# Patient Record
Sex: Female | Born: 1950 | Race: Black or African American | Hispanic: No | Marital: Single | Smoking: Former smoker
Health system: Southern US, Community
[De-identification: ages and names within clinical notes are randomized; demographics above are authoritative.]

## PROBLEM LIST (undated history)

## (undated) DIAGNOSIS — L03116 Cellulitis of left lower limb: Secondary | ICD-10-CM

## (undated) DIAGNOSIS — I10 Essential (primary) hypertension: Secondary | ICD-10-CM

## (undated) DIAGNOSIS — C541 Malignant neoplasm of endometrium: Secondary | ICD-10-CM

## (undated) DIAGNOSIS — E119 Type 2 diabetes mellitus without complications: Secondary | ICD-10-CM

## (undated) DIAGNOSIS — R627 Adult failure to thrive: Secondary | ICD-10-CM

## (undated) DIAGNOSIS — R339 Retention of urine, unspecified: Secondary | ICD-10-CM

## (undated) DIAGNOSIS — E46 Unspecified protein-calorie malnutrition: Secondary | ICD-10-CM

## (undated) DIAGNOSIS — J45909 Unspecified asthma, uncomplicated: Secondary | ICD-10-CM

## (undated) DIAGNOSIS — L98429 Non-pressure chronic ulcer of back with unspecified severity: Secondary | ICD-10-CM

## (undated) DIAGNOSIS — E079 Disorder of thyroid, unspecified: Secondary | ICD-10-CM

## (undated) DIAGNOSIS — E785 Hyperlipidemia, unspecified: Secondary | ICD-10-CM

## (undated) DIAGNOSIS — H409 Unspecified glaucoma: Secondary | ICD-10-CM

## (undated) DIAGNOSIS — I700 Atherosclerosis of aorta: Secondary | ICD-10-CM

## (undated) DIAGNOSIS — L03115 Cellulitis of right lower limb: Secondary | ICD-10-CM

## (undated) HISTORY — DX: Unspecified glaucoma: H40.9

## (undated) HISTORY — DX: Unspecified protein-calorie malnutrition: E46

## (undated) HISTORY — DX: Cellulitis of left lower limb: L03.116

## (undated) HISTORY — DX: Type 2 diabetes mellitus without complications: E11.9

## (undated) HISTORY — DX: Cellulitis of right lower limb: L03.115

## (undated) HISTORY — DX: Non-pressure chronic ulcer of back with unspecified severity: L98.429

## (undated) HISTORY — PX: APPENDECTOMY: SHX54

## (undated) HISTORY — DX: Retention of urine, unspecified: R33.9

## (undated) HISTORY — DX: Disorder of thyroid, unspecified: E07.9

## (undated) HISTORY — DX: Adult failure to thrive: R62.7

## (undated) HISTORY — DX: Unspecified asthma, uncomplicated: J45.909

## (undated) HISTORY — DX: Atherosclerosis of aorta: I70.0

## (undated) HISTORY — DX: Malignant neoplasm of endometrium: C54.1

---

## 2005-02-13 ENCOUNTER — Ambulatory Visit: Payer: Self-pay

## 2007-11-16 ENCOUNTER — Other Ambulatory Visit: Payer: Self-pay

## 2007-11-16 ENCOUNTER — Ambulatory Visit: Payer: Self-pay | Admitting: Ophthalmology

## 2007-12-01 ENCOUNTER — Ambulatory Visit: Payer: Self-pay | Admitting: Ophthalmology

## 2008-02-09 ENCOUNTER — Ambulatory Visit: Payer: Self-pay | Admitting: Ophthalmology

## 2008-11-15 ENCOUNTER — Ambulatory Visit: Payer: Self-pay

## 2009-11-24 ENCOUNTER — Ambulatory Visit: Payer: Self-pay | Admitting: Family Medicine

## 2011-05-27 ENCOUNTER — Ambulatory Visit: Payer: Self-pay | Admitting: Family Medicine

## 2013-03-06 ENCOUNTER — Emergency Department: Payer: Self-pay | Admitting: Unknown Physician Specialty

## 2013-03-06 LAB — COMPREHENSIVE METABOLIC PANEL
Albumin: 3.2 g/dL — ABNORMAL LOW (ref 3.4–5.0)
Alkaline Phosphatase: 147 U/L — ABNORMAL HIGH (ref 50–136)
Anion Gap: 4 — ABNORMAL LOW (ref 7–16)
BUN: 17 mg/dL (ref 7–18)
Bilirubin,Total: 0.3 mg/dL (ref 0.2–1.0)
Calcium, Total: 9.1 mg/dL (ref 8.5–10.1)
Chloride: 106 mmol/L (ref 98–107)
Co2: 26 mmol/L (ref 21–32)
Creatinine: 1 mg/dL (ref 0.60–1.30)
EGFR (African American): >60
Potassium: 4.3 mmol/L (ref 3.5–5.1)
SGOT(AST): 15 U/L (ref 15–37)
Sodium: 136 mmol/L (ref 136–145)

## 2013-03-06 LAB — CBC WITH DIFFERENTIAL/PLATELET
Basophil %: 0.7 %
Eosinophil #: 0.2 10*3/uL (ref 0.0–0.7)
Eosinophil %: 2.3 %
HCT: 39.1 % (ref 35.0–47.0)
HGB: 12.7 g/dL (ref 12.0–16.0)
Lymphocyte #: 1.6 10*3/uL (ref 1.0–3.6)
MCH: 28 pg (ref 26.0–34.0)
MCHC: 32.4 g/dL (ref 32.0–36.0)
MCV: 86 fL (ref 80–100)
Monocyte #: 0.6 x10 3/mm (ref 0.2–0.9)
Neutrophil #: 4.5 10*3/uL (ref 1.4–6.5)
Platelet: 267 10*3/uL (ref 150–440)
WBC: 6.9 10*3/uL (ref 3.6–11.0)

## 2015-09-06 ENCOUNTER — Ambulatory Visit: Payer: Self-pay | Attending: Oncology | Admitting: *Deleted

## 2015-09-06 ENCOUNTER — Ambulatory Visit
Admission: RE | Admit: 2015-09-06 | Discharge: 2015-09-06 | Disposition: A | Discharge status: Home / Self Care | Payer: Self-pay | Source: Ambulatory Visit | Attending: Oncology | Admitting: Oncology

## 2015-09-06 ENCOUNTER — Encounter: Payer: Self-pay | Admitting: *Deleted

## 2015-09-06 VITALS — BP 117/79 | HR 92 | Temp 97.3°F | Ht 63.0 in | Wt 204.8 lb

## 2015-09-06 DIAGNOSIS — Z0000 Encounter for general adult medical examination without abnormal findings: Secondary | ICD-10-CM

## 2015-09-06 NOTE — Progress Notes (Addendum)
Subjective:     Patient ID: [name], female   DOB: [birth year], 64 y.o.   MRN: [id number]  HPI   Review of Systems     Objective:   Physical Exam  Pulmonary/Chest: Right breast exhibits no inverted nipple, no mass, no nipple discharge, no skin change and no tenderness. Left breast exhibits no inverted nipple, no mass, no nipple discharge, no skin change and no tenderness. Breasts are symmetrical.  Large pendulous breast       Assessment:      64 year old Black female returns to [facility] for clinical breast exam and mammogram.  Patient's last pap was in 2009, but she refuses to have a pap smear ever again. Patient was unable to get on the exam table.  Breast exam was completed with the patient sitting in a chair. Clinical breast exam unremarkable.  Taught self breast awareness.  Patient has been screened for eligibility.  She does not have any insurance, Medicare or Medicaid.  She also meets financial eligibility.  Hand-out given on the Affordable Care Act.    Plan:     Screening mammogram ordered.  Will follow-up per protocol.

## 2015-09-06 NOTE — Patient Instructions (Signed)
Gave patient hand-out, Women Staying Healthy, Active and Well from [facility], with education on breast health, pap smears, heart and colon health. 

## 2015-09-11 ENCOUNTER — Encounter: Payer: Self-pay | Admitting: *Deleted

## 2015-09-11 NOTE — Progress Notes (Signed)
Letter mailed from the [facility] to inform patient of her normal mammogram results.  Patient is to follow-up with annual screening in one year.  HSIS to Christy. 

## 2015-12-20 ENCOUNTER — Other Ambulatory Visit: Payer: Self-pay | Admitting: Family Medicine

## 2015-12-20 ENCOUNTER — Ambulatory Visit
Admission: RE | Admit: 2015-12-20 | Discharge: 2015-12-20 | Disposition: A | Discharge status: Home / Self Care | Payer: Self-pay | Source: Ambulatory Visit | Attending: Family Medicine | Admitting: Family Medicine

## 2015-12-20 DIAGNOSIS — M25552 Pain in left hip: Secondary | ICD-10-CM

## 2015-12-20 DIAGNOSIS — M25562 Pain in left knee: Secondary | ICD-10-CM

## 2015-12-20 DIAGNOSIS — M1712 Unilateral primary osteoarthritis, left knee: Secondary | ICD-10-CM | POA: Insufficient documentation

## 2015-12-20 DIAGNOSIS — M1612 Unilateral primary osteoarthritis, left hip: Secondary | ICD-10-CM | POA: Insufficient documentation

## 2016-09-27 ENCOUNTER — Other Ambulatory Visit: Payer: Self-pay | Admitting: Family Medicine

## 2016-09-27 DIAGNOSIS — R609 Edema, unspecified: Secondary | ICD-10-CM

## 2016-09-30 ENCOUNTER — Ambulatory Visit
Admission: RE | Admit: 2016-09-30 | Discharge: 2016-09-30 | Disposition: A | Discharge status: Home / Self Care | Payer: Medicare Other | Source: Ambulatory Visit | Attending: Family Medicine | Admitting: Family Medicine

## 2016-09-30 DIAGNOSIS — R609 Edema, unspecified: Secondary | ICD-10-CM | POA: Diagnosis not present

## 2016-11-04 ENCOUNTER — Encounter (INDEPENDENT_AMBULATORY_CARE_PROVIDER_SITE_OTHER): Payer: Medicare Other | Admitting: Vascular Surgery

## 2016-11-18 ENCOUNTER — Ambulatory Visit (INDEPENDENT_AMBULATORY_CARE_PROVIDER_SITE_OTHER): Payer: Medicare Other | Admitting: Vascular Surgery

## 2016-11-18 ENCOUNTER — Encounter (INDEPENDENT_AMBULATORY_CARE_PROVIDER_SITE_OTHER): Payer: Self-pay | Admitting: Vascular Surgery

## 2016-11-18 DIAGNOSIS — Z794 Long term (current) use of insulin: Secondary | ICD-10-CM

## 2016-11-18 DIAGNOSIS — I890 Lymphedema, not elsewhere classified: Secondary | ICD-10-CM | POA: Diagnosis not present

## 2016-11-18 DIAGNOSIS — E119 Type 2 diabetes mellitus without complications: Secondary | ICD-10-CM | POA: Diagnosis not present

## 2016-11-18 DIAGNOSIS — M7989 Other specified soft tissue disorders: Secondary | ICD-10-CM | POA: Insufficient documentation

## 2016-11-18 DIAGNOSIS — I83029 Varicose veins of left lower extremity with ulcer of unspecified site: Secondary | ICD-10-CM | POA: Insufficient documentation

## 2016-11-18 DIAGNOSIS — I872 Venous insufficiency (chronic) (peripheral): Secondary | ICD-10-CM | POA: Insufficient documentation

## 2016-11-18 DIAGNOSIS — L97929 Non-pressure chronic ulcer of unspecified part of left lower leg with unspecified severity: Principal | ICD-10-CM

## 2016-11-18 DIAGNOSIS — E782 Mixed hyperlipidemia: Secondary | ICD-10-CM

## 2016-11-18 DIAGNOSIS — E785 Hyperlipidemia, unspecified: Secondary | ICD-10-CM | POA: Insufficient documentation

## 2016-11-18 NOTE — Progress Notes (Signed)
MRN : [id number]  [name] is a 65 y.o. (01-06-1951) female who presents with chief complaint of  Chief Complaint  Patient presents with  . New Evaluation    Left leg and foot swelling  .  History of Present Illness: Patient is seen for evaluation of leg pain and swelling associated with new onset ulceration left leg. The patient first noticed the swelling remotely. The swelling is associated with pain and discoloration. The pain and swelling worsens with prolonged dependency and improves with elevation. The pain is unrelated to activity.  The patient notes that in the morning the legs are better but the leg symptoms worsened throughout the course of the day. The patient has also noted a progressive worsening of the discoloration in the ankle and shin area.   The patient notes that a left ankleulcer has developed acutely without specific trauma and since it occurred it has been very slow to heal.  There is a moderate amount of drainage associated with the open area.  The wound is also very painful.  The patient denies claudication symptoms or rest pain symptoms.  The patient denies DJD and LS spine disease.  The patient has not had any past angiography, interventions or vascular surgery.  Elevation makes the leg symptoms better, dependency makes them much worse. The patient denies any recent changes in medications.  The patient has not been wearing graduated compression.  The patient denies a history of DVT or PE. There is no prior history of phlebitis. There is no history of primary lymphedema.  No history of malignancies. No history of trauma or groin or pelvic surgery. There is no history of radiation treatment to the groin or pelvis       Current Meds  Medication Sig  . ammonium lactate (AMLACTIN) 12 % cream Apply topically as needed for dry skin.  Marland Kitchen aspirin 81 MG chewable tablet Chew by mouth daily.  . cetirizine (ZYRTEC) 10 MG tablet Take 10 mg by mouth daily.  Marland Kitchen  glipiZIDE (GLUCOTROL) 5 MG tablet Take by mouth daily before breakfast.  . hydrochlorothiazide (HYDRODIURIL) 25 MG tablet Take 25 mg by mouth daily.  Marland Kitchen levothyroxine (SYNTHROID, LEVOTHROID) 125 MCG tablet Take 125 mcg by mouth daily before breakfast.  . linagliptin (TRADJENTA) 5 MG TABS tablet Take 5 mg by mouth daily.  . Magnesium 200 MG TABS Take by mouth.  . metFORMIN (GLUCOPHAGE) 500 MG tablet Take by mouth 2 (two) times daily with a meal.  . pravastatin (PRAVACHOL) 20 MG tablet Take 20 mg by mouth daily.  . quinapril (ACCUPRIL) 40 MG tablet Take 40 mg by mouth at bedtime.  . triamcinolone ointment (KENALOG) 0.1 % Apply 1 application topically 2 (two) times daily.    Past Medical History:  Diagnosis Date  . Diabetes mellitus without complication [facility])     Past Surgical History:  Procedure Laterality Date  . APPENDECTOMY    . TONSILLECTOMY      Social History Social History  Substance Use Topics  . Smoking status: Former Research scientist (life sciences)  . Smokeless tobacco: Never Used  . Alcohol use No    Family History Family History  Problem Relation Age of Onset  . Diabetes Mother   . Diabetes Father   . Breast cancer Neg Hx   No family history of bleeding/clotting disorders, porphyria or autoimmune disease   Allergies  Allergen Reactions  . Penicillins Swelling     REVIEW OF SYSTEMS (Negative unless checked)  Constitutional: [] Weight loss  []   Fever  [] Chills Cardiac: [] Chest pain   [] Chest pressure   [] Palpitations   [] Shortness of breath when laying flat   [] Shortness of breath with exertion. Vascular:  [] Pain in legs with walking   [] Pain in legs at rest  [] History of DVT   [] Phlebitis   [x] Swelling in legs   [x] Varicose veins   [x] Non-healing ulcers Pulmonary:   [] Uses home oxygen   [] Productive cough   [] Hemoptysis   [] Wheeze  [] COPD   [] Asthma Neurologic:  [] Dizziness   [] Seizures   [] History of stroke   [] History of TIA  [] Aphasia   [] Vissual changes   [] Weakness or numbness in  arm   [] Weakness or numbness in leg Musculoskeletal:   [] Joint swelling   [x] Joint pain   [] Low back pain Hematologic:  [] Easy bruising  [] Easy bleeding   [] Hypercoagulable state   [] Anemic Gastrointestinal:  [] Diarrhea   [] Vomiting  [] Gastroesophageal reflux/heartburn   [] Difficulty swallowing. Genitourinary:  [] Chronic kidney disease   [] Difficult urination  [] Frequent urination   [] Blood in urine Skin:  [] Rashes   [] Ulcers  Psychological:  [] History of anxiety   []  History of major depression.  Physical Examination  Vitals:   11/18/16 1142  BP: (!) 144/84  Pulse: 95  Resp: 17  Weight: 198 lb (89.8 kg)  Height: 5\' 3"  (1.6 m)   Body mass index is 35.07 kg/m. Gen: WD/WN, NAD Head: [place]/AT, No temporalis wasting.  Ear/Nose/Throat: Hearing grossly intact, nares w/o erythema or drainage, poor dentition Eyes: PER, EOMI, sclera nonicteric.  Neck: Supple, no masses.  No bruit or JVD.  Pulmonary:  Good air movement, clear to auscultation bilaterally, no use of accessory muscles.  Cardiac: RRR, normal S1, S2, no Murmurs. Vascular: bilateral lower extremities demonstrate 2-3+ edema there are mild venous stasis changes on the right there are severe venous stasis changes of the left associated with ulceration of the ankle area. Subcutaneous tissues are exposed. No surrounding erythema or induration minimal drainage.diffuse varicosities are noted bilaterally Vessel Right Left  Radial Palpable Palpable  Ulnar Palpable Palpable  Brachial Palpable Palpable  Carotid Palpable Palpable  Femoral Palpable Palpable  Popliteal Palpable Palpable  PT Palpable Not Palpable  DP Palpable Palpable   Gastrointestinal: soft, non-distended. No guarding/no peritoneal signs.  Musculoskeletal: M/S 5/5 throughout.  No deformity or atrophy.  Neurologic: CN 2-12 intact. Pain and light touch intact in extremities.  Symmetrical.  Speech is fluent. Motor exam as listed above. Psychiatric: Judgment intact, Mood &  affect appropriate for pt's clinical situation. Dermatologic: No rashes or ulcers noted.  No changes consistent with cellulitis. Lymph : No Cervical lymphadenopathy, no lichenification or skin changes of chronic lymphedema.  CBC Lab Results  Component Value Date   WBC 6.9 03/06/2013   HGB 12.7 03/06/2013   HCT 39.1 03/06/2013   MCV 86 03/06/2013   PLT 267 03/06/2013    BMET    Component Value Date/Time   NA 136 03/06/2013 1122   K 4.3 03/06/2013 1122   CL 106 03/06/2013 1122   CO2 26 03/06/2013 1122   GLUCOSE 243 (H) 03/06/2013 1122   BUN 17 03/06/2013 1122   CREATININE 1.00 03/06/2013 1122   CALCIUM 9.1 03/06/2013 1122   GFRNONAA >60 03/06/2013 1122   GFRAA >60 03/06/2013 1122   CrCl cannot be calculated (Patient's most recent lab result is older than the maximum 21 days allowed.).  COAG No results found for: INR, PROTIME  Radiology No results found.  Outside Studies/Documentation 8 pages of outside documents  were reviewed.  They showed similar findings on physical examination duplex ultrasound report states no evidence for DVT no significant reflux identified in the report.  Assessment/Plan 1. Venous ulcer of left leg (HCC) No surgery or intervention at this point in time.    I have had a long discussion with the patient regarding venous insufficiency and why it  causes symptoms, specifically venous ulceration . I have discussed with the patient the chronic skin changes that accompany venous insufficiency and the long term sequela such as infection and recurring  ulceration.  Patient will be placed in [employer] which will be changed weekly drainage permitting.  In addition, behavioral modification including several periods of elevation of the lower extremities during the day will be continued. Achieving a position with the ankles at heart level was stressed to the patient  The patient is instructed to begin routine exercise, especially walking on a daily  basis  Patient's duplex ultrasound of the venous system was negative for DVT or reflux is not present.  Following the review of the ultrasound the patient will follow up in one week to reassess the degree of swelling and the control that Unna therapy is offering.   The patient can be assessed for graduated compression stockings or wraps as well as a Lymph Pump once the ulcers are healed.   2. Chronic venous insufficiency Seen #1  3. Lymphedema See #1 Once ulceration is healed consideration for lymph pump will be made  4. Swelling of limb See #1  5. Type 2 diabetes mellitus with insulin therapy ([place]) Continue hypoglycemic  medications as already ordered and reviewed, no changes at this time.  6. Mixed hyperlipidemia Continue statin as ordered and reviewed, no changes at this time      Hortencia Pilar, MD  11/18/2016 12:58 PM

## 2016-11-25 ENCOUNTER — Encounter (INDEPENDENT_AMBULATORY_CARE_PROVIDER_SITE_OTHER): Payer: Self-pay

## 2016-11-25 ENCOUNTER — Ambulatory Visit (INDEPENDENT_AMBULATORY_CARE_PROVIDER_SITE_OTHER): Payer: Medicare Other | Admitting: Vascular Surgery

## 2016-11-25 VITALS — BP 151/86 | HR 96 | Resp 16 | Ht 63.0 in | Wt 198.0 lb

## 2016-11-25 DIAGNOSIS — I83029 Varicose veins of left lower extremity with ulcer of unspecified site: Secondary | ICD-10-CM

## 2016-11-25 DIAGNOSIS — L97929 Non-pressure chronic ulcer of unspecified part of left lower leg with unspecified severity: Principal | ICD-10-CM

## 2016-11-25 NOTE — Progress Notes (Signed)
History of Present Illness  There is no documented history at this time  Assessments & Plan   There are no diagnoses linked to this encounter.    Additional instructions  Subjective:  Patient presents with venous ulcer of the Left lower extremity.    Procedure:  3 layer unna wrap was placed Left lower extremity.   Plan:   Follow up in one week.  

## 2016-12-02 ENCOUNTER — Encounter (INDEPENDENT_AMBULATORY_CARE_PROVIDER_SITE_OTHER): Payer: Self-pay

## 2016-12-02 ENCOUNTER — Ambulatory Visit (INDEPENDENT_AMBULATORY_CARE_PROVIDER_SITE_OTHER): Payer: Medicare Other | Admitting: Vascular Surgery

## 2016-12-02 VITALS — BP 138/80 | HR 97 | Resp 16 | Wt 197.0 lb

## 2016-12-02 DIAGNOSIS — I83029 Varicose veins of left lower extremity with ulcer of unspecified site: Secondary | ICD-10-CM

## 2016-12-02 DIAGNOSIS — L97929 Non-pressure chronic ulcer of unspecified part of left lower leg with unspecified severity: Principal | ICD-10-CM

## 2016-12-02 NOTE — Progress Notes (Signed)
History of Present Illness  There is no documented history at this time  Assessments & Plan   There are no diagnoses linked to this encounter.    Additional instructions  Subjective:  Patient presents with venous ulcer of the Left lower extremity.    Procedure:  3 layer unna wrap was placed Left lower extremity.   Plan:   Follow up in one week.  

## 2016-12-09 ENCOUNTER — Encounter (INDEPENDENT_AMBULATORY_CARE_PROVIDER_SITE_OTHER): Payer: Self-pay | Admitting: Vascular Surgery

## 2016-12-09 ENCOUNTER — Ambulatory Visit (INDEPENDENT_AMBULATORY_CARE_PROVIDER_SITE_OTHER): Payer: Medicare Other | Admitting: Vascular Surgery

## 2016-12-09 VITALS — BP 137/81 | HR 93 | Resp 16 | Wt 196.0 lb

## 2016-12-09 DIAGNOSIS — I83029 Varicose veins of left lower extremity with ulcer of unspecified site: Secondary | ICD-10-CM

## 2016-12-09 DIAGNOSIS — L97929 Non-pressure chronic ulcer of unspecified part of left lower leg with unspecified severity: Principal | ICD-10-CM

## 2016-12-09 NOTE — Progress Notes (Signed)
History of Present Illness  There is no documented history at this time  Assessments & Plan   There are no diagnoses linked to this encounter.    Additional instructions  Subjective:  Patient presents with venous ulcer of the Left lower extremity.    Procedure:  3 layer unna wrap was placed Left lower extremity.   Plan:   Follow up in one week.  

## 2016-12-17 ENCOUNTER — Encounter (INDEPENDENT_AMBULATORY_CARE_PROVIDER_SITE_OTHER): Payer: Self-pay | Admitting: Vascular Surgery

## 2016-12-17 ENCOUNTER — Ambulatory Visit (INDEPENDENT_AMBULATORY_CARE_PROVIDER_SITE_OTHER): Payer: Medicare Other | Admitting: Vascular Surgery

## 2016-12-17 VITALS — BP 143/84 | HR 85 | Resp 16 | Ht 62.0 in | Wt 194.0 lb

## 2016-12-17 DIAGNOSIS — M7989 Other specified soft tissue disorders: Secondary | ICD-10-CM

## 2016-12-17 NOTE — Progress Notes (Signed)
History of Present Illness  There is no documented history at this time  Assessments & Plan   There are no diagnoses linked to this encounter.    Additional instructions  Subjective:  Patient presents with venous ulcer of the Left lower extremity.    Procedure:  3 layer unna wrap was placed Left lower extremity.   Plan:   Follow up in one week.  

## 2016-12-17 NOTE — Assessment & Plan Note (Signed)
See UNNA wrap

## 2016-12-18 ENCOUNTER — Other Ambulatory Visit: Payer: Self-pay | Admitting: Nurse Practitioner

## 2016-12-18 DIAGNOSIS — Z13820 Encounter for screening for osteoporosis: Secondary | ICD-10-CM

## 2016-12-24 ENCOUNTER — Ambulatory Visit (INDEPENDENT_AMBULATORY_CARE_PROVIDER_SITE_OTHER): Payer: Medicare Other | Admitting: Vascular Surgery

## 2016-12-24 ENCOUNTER — Encounter (INDEPENDENT_AMBULATORY_CARE_PROVIDER_SITE_OTHER): Payer: Self-pay | Admitting: Vascular Surgery

## 2016-12-24 VITALS — BP 149/76 | HR 84 | Resp 16 | Wt 193.0 lb

## 2016-12-24 DIAGNOSIS — L97929 Non-pressure chronic ulcer of unspecified part of left lower leg with unspecified severity: Principal | ICD-10-CM

## 2016-12-24 DIAGNOSIS — I83029 Varicose veins of left lower extremity with ulcer of unspecified site: Secondary | ICD-10-CM

## 2016-12-24 NOTE — Progress Notes (Signed)
History of Present Illness  There is no documented history at this time  Assessments & Plan   There are no diagnoses linked to this encounter.    Additional instructions  Subjective:  Patient presents with venous ulcer of the Left lower extremity.    Procedure:  3 layer unna wrap was placed Left lower extremity.   Plan:   Follow up in one week.  

## 2016-12-31 ENCOUNTER — Ambulatory Visit (INDEPENDENT_AMBULATORY_CARE_PROVIDER_SITE_OTHER): Payer: Medicare Other | Admitting: Vascular Surgery

## 2016-12-31 ENCOUNTER — Encounter (INDEPENDENT_AMBULATORY_CARE_PROVIDER_SITE_OTHER): Payer: Self-pay | Admitting: Vascular Surgery

## 2016-12-31 VITALS — BP 125/87 | HR 88 | Resp 16 | Ht 65.5 in | Wt 196.0 lb

## 2016-12-31 DIAGNOSIS — Z794 Long term (current) use of insulin: Secondary | ICD-10-CM

## 2016-12-31 DIAGNOSIS — I83029 Varicose veins of left lower extremity with ulcer of unspecified site: Secondary | ICD-10-CM | POA: Diagnosis not present

## 2016-12-31 DIAGNOSIS — E119 Type 2 diabetes mellitus without complications: Secondary | ICD-10-CM

## 2016-12-31 DIAGNOSIS — M7989 Other specified soft tissue disorders: Secondary | ICD-10-CM

## 2016-12-31 DIAGNOSIS — L97929 Non-pressure chronic ulcer of unspecified part of left lower leg with unspecified severity: Secondary | ICD-10-CM

## 2016-12-31 NOTE — Progress Notes (Signed)
Subjective:    Patient ID: [name], female    DOB: 22-Mar-1951, 66 y.o.   MRN: [id number] Chief Complaint  Patient presents with  . Leg Swelling    Unna check    Patient presents for a left lower extremity unna boot check. She has been treated with unna wraps x six weeks. She presents today stating improvement in her edema and healing of her ulceration. She still has a prescription for compression stockings however states she has them at home. We reviewed proper elevation techniques.    Review of Systems  Constitutional: Negative.   HENT: Negative.   Eyes: Negative.   Respiratory: Negative.   Cardiovascular: Negative.   Gastrointestinal: Negative.   Endocrine: Negative.   Genitourinary: Negative.   Musculoskeletal: Negative.   Skin: Negative.   Allergic/Immunologic: Negative.   Neurological: Negative.   Hematological: Negative.   Psychiatric/Behavioral: Negative.       Objective:   Physical Exam  BP 125/87 (BP Location: Right Arm)   Pulse 88   Resp 16   Ht 5' 5.5" (1.664 m)   Wt 196 lb (88.9 kg)   LMP  (LMP Unknown)   BMI 32.12 kg/m   Past Medical History:  Diagnosis Date  . Diabetes mellitus without complication [facility])    Social History   Social History  . Marital status: Single    Spouse name: N/A  . Number of children: N/A  . Years of education: N/A   Occupational History  . Not on file.   Social History Main Topics  . Smoking status: Former Research scientist (life sciences)  . Smokeless tobacco: Never Used  . Alcohol use No  . Drug use: No  . Sexual activity: Not on file   Other Topics Concern  . Not on file   Social History Narrative  . No narrative on file   Past Surgical History:  Procedure Laterality Date  . APPENDECTOMY    . TONSILLECTOMY     Family History  Problem Relation Age of Onset  . Diabetes Mother   . Diabetes Father   . Breast cancer Neg Hx    Allergies  Allergen Reactions  . Penicillins Swelling      Assessment & Plan:  Patient  presents for a left lower extremity unna boot check. She has been treated with unna wraps x six weeks. She presents today stating improvement in her edema and healing of her ulceration. She still has a prescription for compression stockings however states she has them at home. We reviewed proper elevation techniques.   1. Swelling of limb - Improved Swelling is improved and ulceration has healed.  No need to continue unna boot therapy.  The patient was encouraged to wear graduated compression stockings (20-30 mmHg) on a daily basis. The patient was instructed to begin wearing the stockings first thing in the morning and removing them in the evening. The patient was instructed specifically not to sleep in the stockings. Patient has stockings In addition, behavioral modification including elevation during the day will be initiated. Anti-inflammatories for pain. Will bring patient back for venous duplex to rule out reflux/ The patient was instructed to call the office in the interim if any worsening edema or ulcerations to the legs, feet or toes occurs. The patient expresses their understanding.  - VAS [place] LOWER EXTREMITY VENOUS REFLUX; Future  2. Venous ulcer of left leg (HCC) - Improved Swelling is improved and ulceration has healed.  No need to continue unna boot therapy.  3. Type 2 diabetes mellitus with insulin therapy ([place]) - Stable Encouraged good control as its slows the progression of atherosclerotic disease  Current Outpatient Prescriptions on File Prior to Visit  Medication Sig Dispense Refill  . ammonium lactate (AMLACTIN) 12 % cream Apply topically as needed for dry skin.    Marland Kitchen aspirin 81 MG chewable tablet Chew by mouth daily.    . cetirizine (ZYRTEC) 10 MG tablet Take 10 mg by mouth daily.    Marland Kitchen glipiZIDE (GLUCOTROL) 5 MG tablet Take by mouth daily before breakfast.    . hydrochlorothiazide (HYDRODIURIL) 25 MG tablet Take 25 mg by mouth daily.    Marland Kitchen levothyroxine (SYNTHROID,  LEVOTHROID) 125 MCG tablet Take 125 mcg by mouth daily before breakfast.    . linagliptin (TRADJENTA) 5 MG TABS tablet Take 5 mg by mouth daily.    . Magnesium 200 MG TABS Take by mouth.    . metFORMIN (GLUCOPHAGE) 500 MG tablet Take by mouth 2 (two) times daily with a meal.    . pravastatin (PRAVACHOL) 20 MG tablet Take 20 mg by mouth daily.    . quinapril (ACCUPRIL) 40 MG tablet Take 40 mg by mouth at bedtime.    . triamcinolone ointment (KENALOG) 0.1 % Apply 1 application topically 2 (two) times daily.     No current facility-administered medications on file prior to visit.     There are no Patient Instructions on file for this visit. No Follow-up on file.   Nikola Marone A Darina Hartwell, PA-C

## 2017-01-22 ENCOUNTER — Ambulatory Visit (INDEPENDENT_AMBULATORY_CARE_PROVIDER_SITE_OTHER): Payer: Medicare Other

## 2017-01-22 ENCOUNTER — Ambulatory Visit (INDEPENDENT_AMBULATORY_CARE_PROVIDER_SITE_OTHER): Payer: Medicare Other | Admitting: Vascular Surgery

## 2017-01-22 ENCOUNTER — Encounter (INDEPENDENT_AMBULATORY_CARE_PROVIDER_SITE_OTHER): Payer: Self-pay | Admitting: Vascular Surgery

## 2017-01-22 VITALS — BP 154/89 | HR 94 | Resp 16 | Ht 63.0 in | Wt 198.0 lb

## 2017-01-22 DIAGNOSIS — M7989 Other specified soft tissue disorders: Secondary | ICD-10-CM | POA: Diagnosis not present

## 2017-01-22 DIAGNOSIS — I890 Lymphedema, not elsewhere classified: Secondary | ICD-10-CM | POA: Diagnosis not present

## 2017-01-22 DIAGNOSIS — I872 Venous insufficiency (chronic) (peripheral): Secondary | ICD-10-CM | POA: Diagnosis not present

## 2017-01-28 ENCOUNTER — Ambulatory Visit: Payer: Medicare Other

## 2017-03-04 NOTE — Progress Notes (Signed)
Subjective:    Patient ID: [name], female    DOB: Oct 03, 1951, 65 y.o.   MRN: [id number] Chief Complaint  Patient presents with  . Re-evaluation    Ultrasound follow up   Patient presents to review vascular studies. She was last seen on 12/31/16 and transitioned out of unna wraps and into compression stockings. So far, her symptoms have been controlled. No new ulceration is noted. The patient underwent a bilateral lower extremty venous duplex which was notable venous incompetence noted in the bilateral GSV as well as in the bilateral deep venous system.    Review of Systems  Constitutional: Negative.   HENT: Negative.   Eyes: Negative.   Respiratory: Negative.   Cardiovascular: Positive for leg swelling.  Gastrointestinal: Negative.   Endocrine: Negative.   Genitourinary: Negative.   Musculoskeletal: Negative.   Skin: Negative.   Allergic/Immunologic: Negative.   Neurological: Negative.   Hematological: Negative.   Psychiatric/Behavioral: Negative.        Objective:   Physical Exam  Constitutional: She is oriented to person, place, and time. She appears well-developed and well-nourished.  HENT:  Head: Normocephalic and atraumatic.  Right Ear: External ear normal.  Left Ear: External ear normal.  Eyes: Conjunctivae and EOM are normal. Pupils are equal, round, and reactive to light.  Neck: Normal range of motion.  Cardiovascular: Normal rate, regular rhythm, normal heart sounds and intact distal pulses.   Pulses:      Radial pulses are 2+ on the right side, and 2+ on the left side.       Dorsalis pedis pulses are 2+ on the right side, and 2+ on the left side.       Posterior tibial pulses are 2+ on the right side, and 2+ on the left side.  Pulmonary/Chest: Effort normal and breath sounds normal.  Musculoskeletal: Normal range of motion. She exhibits edema (Mild Edema Noted Bilaterally ).  Neurological: She is alert and oriented to person, place, and time.  Skin:  Skin is warm and dry.  Psychiatric: She has a normal mood and affect. Her behavior is normal. Judgment and thought content normal.   BP (!) 154/89 (BP Location: Right Arm)   Pulse 94   Resp 16   Ht 5\' 3"  (1.6 m)   Wt 198 lb (89.8 kg)   LMP  (LMP Unknown)   BMI 35.07 kg/m   Past Medical History:  Diagnosis Date  . Diabetes mellitus without complication [facility])    Social History   Social History  . Marital status: Single    Spouse name: N/A  . Number of children: N/A  . Years of education: N/A   Occupational History  . Not on file.   Social History Main Topics  . Smoking status: Former Research scientist (life sciences)  . Smokeless tobacco: Never Used  . Alcohol use No  . Drug use: No  . Sexual activity: Not on file   Other Topics Concern  . Not on file   Social History Narrative  . No narrative on file   Past Surgical History:  Procedure Laterality Date  . APPENDECTOMY    . TONSILLECTOMY     Family History  Problem Relation Age of Onset  . Diabetes Mother   . Diabetes Father   . Breast cancer Neg Hx    Allergies  Allergen Reactions  . Penicillins Swelling      Assessment & Plan:  Patient presents to review vascular studies. She was last seen on 12/31/16 and  transitioned out of unna wraps and into compression stockings. So far, her symptoms have been controlled. No new ulceration is noted. The patient underwent a bilateral lower extremity venous duplex which was notable venous incompetence noted in the bilateral GSV as well as in the bilateral deep venous system.  1. Chronic venous insufficiency - New Symptoms controlled with conservative therapy at the present time. Patient should continue compression and elevation.  Possible candidate for laser ablation if conservative therapy fails.   2. Lymphedema - New Symptoms controlled with conservative therapy at the present time. Patient should continue compression and elevation.  Possible candidate for lymphedema if conservative therapy  fails.  3. Swelling of limb - Stable Symptoms controlled with conservative therapy at the present time. Patient should continue compression and elevation. The patient was instructed to call the office in the interim if any worsening edema or ulcerations to the legs, feet or toes occurs. The patient expresses their understanding.  Current Outpatient Prescriptions on File Prior to Visit  Medication Sig Dispense Refill  . ammonium lactate (AMLACTIN) 12 % cream Apply topically as needed for dry skin.    Marland Kitchen aspirin 81 MG chewable tablet Chew by mouth daily.    . cetirizine (ZYRTEC) 10 MG tablet Take 10 mg by mouth daily.    Marland Kitchen glipiZIDE (GLUCOTROL) 5 MG tablet Take by mouth daily before breakfast.    . hydrochlorothiazide (HYDRODIURIL) 25 MG tablet Take 25 mg by mouth daily.    Marland Kitchen levothyroxine (SYNTHROID, LEVOTHROID) 125 MCG tablet Take 125 mcg by mouth daily before breakfast.    . linagliptin (TRADJENTA) 5 MG TABS tablet Take 5 mg by mouth daily.    . Magnesium 200 MG TABS Take by mouth.    . metFORMIN (GLUCOPHAGE) 500 MG tablet Take by mouth 2 (two) times daily with a meal.    . pravastatin (PRAVACHOL) 20 MG tablet Take 20 mg by mouth daily.    . quinapril (ACCUPRIL) 40 MG tablet Take 40 mg by mouth at bedtime.    . triamcinolone ointment (KENALOG) 0.1 % Apply 1 application topically 2 (two) times daily.     No current facility-administered medications on file prior to visit.     There are no Patient Instructions on file for this visit. No Follow-up on file.   KIMBERLY A STEGMAYER, PA-C

## 2017-03-06 ENCOUNTER — Ambulatory Visit: Payer: Medicare Other

## 2017-04-09 ENCOUNTER — Other Ambulatory Visit: Payer: Medicare Other

## 2018-03-26 ENCOUNTER — Emergency Department: Payer: Medicare HMO

## 2018-03-26 ENCOUNTER — Other Ambulatory Visit: Payer: Self-pay

## 2018-03-26 ENCOUNTER — Inpatient Hospital Stay
Admission: EM | Admit: 2018-03-26 | Discharge: 2018-04-03 | DRG: 872 | Disposition: A | Discharge status: Skilled Nursing Facility | Payer: Medicare HMO | Attending: Internal Medicine | Admitting: Internal Medicine

## 2018-03-26 ENCOUNTER — Encounter: Payer: Self-pay | Admitting: Internal Medicine

## 2018-03-26 DIAGNOSIS — K5900 Constipation, unspecified: Secondary | ICD-10-CM | POA: Diagnosis not present

## 2018-03-26 DIAGNOSIS — Z7982 Long term (current) use of aspirin: Secondary | ICD-10-CM | POA: Diagnosis not present

## 2018-03-26 DIAGNOSIS — R600 Localized edema: Secondary | ICD-10-CM | POA: Diagnosis present

## 2018-03-26 DIAGNOSIS — E785 Hyperlipidemia, unspecified: Secondary | ICD-10-CM | POA: Diagnosis present

## 2018-03-26 DIAGNOSIS — L03116 Cellulitis of left lower limb: Secondary | ICD-10-CM | POA: Diagnosis present

## 2018-03-26 DIAGNOSIS — R627 Adult failure to thrive: Secondary | ICD-10-CM | POA: Diagnosis present

## 2018-03-26 DIAGNOSIS — R338 Other retention of urine: Secondary | ICD-10-CM | POA: Diagnosis present

## 2018-03-26 DIAGNOSIS — L0390 Cellulitis, unspecified: Secondary | ICD-10-CM

## 2018-03-26 DIAGNOSIS — Z880 Allergy status to penicillin: Secondary | ICD-10-CM | POA: Diagnosis not present

## 2018-03-26 DIAGNOSIS — Z79890 Hormone replacement therapy (postmenopausal): Secondary | ICD-10-CM

## 2018-03-26 DIAGNOSIS — L8990 Pressure ulcer of unspecified site, unspecified stage: Secondary | ICD-10-CM

## 2018-03-26 DIAGNOSIS — Z833 Family history of diabetes mellitus: Secondary | ICD-10-CM

## 2018-03-26 DIAGNOSIS — E860 Dehydration: Secondary | ICD-10-CM | POA: Diagnosis present

## 2018-03-26 DIAGNOSIS — L03115 Cellulitis of right lower limb: Secondary | ICD-10-CM | POA: Diagnosis present

## 2018-03-26 DIAGNOSIS — N1330 Unspecified hydronephrosis: Secondary | ICD-10-CM | POA: Diagnosis present

## 2018-03-26 DIAGNOSIS — E119 Type 2 diabetes mellitus without complications: Secondary | ICD-10-CM | POA: Diagnosis present

## 2018-03-26 DIAGNOSIS — N179 Acute kidney failure, unspecified: Secondary | ICD-10-CM | POA: Diagnosis present

## 2018-03-26 DIAGNOSIS — Z79899 Other long term (current) drug therapy: Secondary | ICD-10-CM

## 2018-03-26 DIAGNOSIS — A419 Sepsis, unspecified organism: Secondary | ICD-10-CM | POA: Diagnosis not present

## 2018-03-26 DIAGNOSIS — I890 Lymphedema, not elsewhere classified: Secondary | ICD-10-CM | POA: Diagnosis present

## 2018-03-26 DIAGNOSIS — I872 Venous insufficiency (chronic) (peripheral): Secondary | ICD-10-CM | POA: Diagnosis present

## 2018-03-26 DIAGNOSIS — E039 Hypothyroidism, unspecified: Secondary | ICD-10-CM | POA: Diagnosis present

## 2018-03-26 DIAGNOSIS — R609 Edema, unspecified: Secondary | ICD-10-CM

## 2018-03-26 DIAGNOSIS — Z87891 Personal history of nicotine dependence: Secondary | ICD-10-CM | POA: Diagnosis not present

## 2018-03-26 DIAGNOSIS — R6251 Failure to thrive (child): Secondary | ICD-10-CM

## 2018-03-26 DIAGNOSIS — Z6829 Body mass index (BMI) 29.0-29.9, adult: Secondary | ICD-10-CM

## 2018-03-26 DIAGNOSIS — Z794 Long term (current) use of insulin: Secondary | ICD-10-CM | POA: Diagnosis not present

## 2018-03-26 DIAGNOSIS — E11628 Type 2 diabetes mellitus with other skin complications: Secondary | ICD-10-CM | POA: Diagnosis present

## 2018-03-26 DIAGNOSIS — L089 Local infection of the skin and subcutaneous tissue, unspecified: Secondary | ICD-10-CM

## 2018-03-26 HISTORY — DX: Hyperlipidemia, unspecified: E78.5

## 2018-03-26 LAB — CBC WITH DIFFERENTIAL/PLATELET
BASOS ABS: 0 10*3/uL (ref 0–0.1)
BASOS PCT: 0 %
EOS PCT: 0 %
Eosinophils Absolute: 0 10*3/uL (ref 0–0.7)
HCT: 31.3 % — ABNORMAL LOW (ref 35.0–47.0)
Hemoglobin: 10.5 g/dL — ABNORMAL LOW (ref 12.0–16.0)
LYMPHS PCT: 3 %
Lymphs Abs: 0.6 10*3/uL — ABNORMAL LOW (ref 1.0–3.6)
MCH: 28.4 pg (ref 26.0–34.0)
MCHC: 33.5 g/dL (ref 32.0–36.0)
MCV: 84.7 fL (ref 80.0–100.0)
Monocytes Absolute: 1 10*3/uL — ABNORMAL HIGH (ref 0.2–0.9)
Monocytes Relative: 6 %
Neutro Abs: 16.9 10*3/uL — ABNORMAL HIGH (ref 1.4–6.5)
Neutrophils Relative %: 91 %
PLATELETS: 302 10*3/uL (ref 150–440)
RBC: 3.7 MIL/uL — AB (ref 3.80–5.20)
RDW: 15.6 % — AB (ref 11.5–14.5)
WBC: 18.6 10*3/uL — AB (ref 3.6–11.0)

## 2018-03-26 LAB — BASIC METABOLIC PANEL
Anion gap: 8 (ref 5–15)
BUN: 83 mg/dL — ABNORMAL HIGH (ref 6–20)
CALCIUM: 8.8 mg/dL — AB (ref 8.9–10.3)
CO2: 22 mmol/L (ref 22–32)
CREATININE: 2.52 mg/dL — AB (ref 0.44–1.00)
Chloride: 102 mmol/L (ref 101–111)
GFR, EST AFRICAN AMERICAN: 22 mL/min — AB (ref >60)
GFR, EST NON AFRICAN AMERICAN: 19 mL/min — AB (ref >60)
Glucose, Bld: 467 mg/dL — ABNORMAL HIGH (ref 65–99)
Potassium: 5.1 mmol/L (ref 3.5–5.1)
SODIUM: 132 mmol/L — AB (ref 135–145)

## 2018-03-26 LAB — LACTIC ACID, PLASMA: LACTIC ACID, VENOUS: 1.8 mmol/L (ref 0.5–1.9)

## 2018-03-26 LAB — TROPONIN I: Troponin I: <0.03 ng/mL (ref <0.03)

## 2018-03-26 MED ORDER — SODIUM CHLORIDE 0.9 % IV SOLN
2.0000 g | INTRAVENOUS | Status: DC
  Filled 2018-03-26: qty 2

## 2018-03-26 MED ORDER — SODIUM CHLORIDE 0.9 % IV SOLN
2.0000 g | Freq: Once | INTRAVENOUS | Status: AC
  Administered 2018-03-26: 2 g via INTRAVENOUS
  Filled 2018-03-26: qty 2

## 2018-03-26 MED ORDER — VANCOMYCIN HCL IN DEXTROSE 1-5 GM/200ML-% IV SOLN
1000.0000 mg | Freq: Once | INTRAVENOUS | Status: AC
  Administered 2018-03-26: 1000 mg via INTRAVENOUS
  Filled 2018-03-26: qty 200

## 2018-03-26 MED ORDER — VANCOMYCIN HCL IN DEXTROSE 1-5 GM/200ML-% IV SOLN
1000.0000 mg | INTRAVENOUS | Status: DC
  Filled 2018-03-26: qty 200

## 2018-03-26 NOTE — ED Notes (Signed)
Attempted report x1, charge nurse is going to take patient but has to call me back.

## 2018-03-26 NOTE — ED Notes (Addendum)
2 nurses needed to get the pt out of her clothes. The pt states she has been in the same clothes for 3 day or more. The pants were adhered to her legs and had to be peeled off. The underwear urine and feces stained and pt had to be cleaned of incontinence. Pt is unable to roll over or help in any way with her care. The rt leg had an old bandage on it when once peeled off exposed raw weeping yellow pus area. The left leg is more swollen but per pt less painful. Multiple areas of raw weeping areas on the legs and feet with a strong odor of necrotic skin. Blood cultures obtained and 1st antibiotic infusing.

## 2018-03-26 NOTE — Progress Notes (Signed)
Pharmacy Antibiotic Note  [name] is a 67 y.o. female admitted on 03/26/2018 with sepsis.  Pharmacy has been consulted for vancomycin and cefepime dosing.  Plan: DW 64kg  Vd 45L kei 0.023 hr-1  T1/2 30 hours Vancomycin 1 gram q 36 hours ordered with stacked dosing. Level before 4th dose. Goal trough 15-20.    Cefepime 2 grams q 24 hours ordered.  Height: 5\' 4"  (162.6 cm) Weight: 169 lb (76.7 kg) IBW/kg (Calculated) : 54.7  Temp (24hrs), Avg:97.8 F (36.6 C), Min:97.8 F (36.6 C), Max:97.8 F (36.6 C)  Recent Labs  Lab 03/26/18 2008 03/26/18 2104  WBC 18.6*  --   CREATININE  --  2.52*  LATICACIDVEN 1.8  --     Estimated Creatinine Clearance: 22 mL/min (A) (by C-G formula based on SCr of 2.52 mg/dL (H)).    Allergies  Allergen Reactions  . Penicillins Swelling    Antimicrobials this admission: Vancomycin, cefepime 4/4  >>    >>   Dose adjustments this admission:   Microbiology results: 4/4 BCx: pending   Thank you for allowing pharmacy to be a part of this patient's care.  Merry [place] S 03/26/2018 11:37 PM

## 2018-03-26 NOTE — ED Triage Notes (Signed)
Pt brought from group home [facility] for bilateral leg swelling, pain with ambulating (uses walker) x 1 month per pt. BS is 510. Legs swollen, lyphedema, dark in color and has a strong odor.

## 2018-03-26 NOTE — ED Notes (Signed)
Patient is now going to [id number]-[id number].

## 2018-03-26 NOTE — ED Provider Notes (Addendum)
[facility] Emergency Department Provider Note  ____________________________________________   I have reviewed the triage vital signs and the nursing notes.   HISTORY  Chief Complaint Leg Swelling   History limited by: Not Limited   HPI [name] is a 67 y.o. female who presents to the emergency department today because of concerns for leg and foot swelling and pain.  Is located primarily in the left leg.  The patient states that is been going on for at least the past month.  Patient states she has not seen anyone for this.  She does have a history of diabetes but has not been checking her blood sugar.  She denies any fevers.   Per medical record review patient has a history of DM  Past Medical History:  Diagnosis Date  . Diabetes mellitus without complication [facility])     Patient Active Problem List   Diagnosis Date Noted  . Venous ulcer of left leg ([place]) 11/18/2016  . Chronic venous insufficiency 11/18/2016  . Lymphedema 11/18/2016  . Swelling of limb 11/18/2016  . Type 2 diabetes mellitus with insulin therapy ([place]) 11/18/2016  . Hyperlipidemia 11/18/2016    Past Surgical History:  Procedure Laterality Date  . APPENDECTOMY    . TONSILLECTOMY      Prior to Admission medications   Medication Sig Start Date End Date Taking? Authorizing Provider  ammonium lactate (AMLACTIN) 12 % cream Apply topically as needed for dry skin.    [provider]  aspirin 81 MG chewable tablet Chew by mouth daily.    [provider]  cetirizine (ZYRTEC) 10 MG tablet Take 10 mg by mouth daily.    [provider]  glipiZIDE (GLUCOTROL) 5 MG tablet Take by mouth daily before breakfast.    [provider]  hydrochlorothiazide (HYDRODIURIL) 25 MG tablet Take 25 mg by mouth daily.    [provider]  levothyroxine (SYNTHROID, LEVOTHROID) 125 MCG tablet Take 125 mcg by mouth daily before breakfast.    [provider]   linagliptin (TRADJENTA) 5 MG TABS tablet Take 5 mg by mouth daily.    [provider]  Magnesium 200 MG TABS Take by mouth.    [provider]  metFORMIN (GLUCOPHAGE) 500 MG tablet Take by mouth 2 (two) times daily with a meal.    [provider]  pravastatin (PRAVACHOL) 20 MG tablet Take 20 mg by mouth daily.    [provider]  quinapril (ACCUPRIL) 40 MG tablet Take 40 mg by mouth at bedtime.    [provider]  triamcinolone ointment (KENALOG) 0.1 % Apply 1 application topically 2 (two) times daily.    [provider]    Allergies Penicillins  Family History  Problem Relation Age of Onset  . Diabetes Mother   . Diabetes Father   . Breast cancer Neg Hx     Social History Social History   Tobacco Use  . Smoking status: Former Research scientist (life sciences)  . Smokeless tobacco: Never Used  Substance Use Topics  . Alcohol use: No  . Drug use: No    Review of Systems Constitutional: No fever/chills Eyes: No visual changes. ENT: No sore throat. Cardiovascular: Denies chest pain. Respiratory: Denies shortness of breath. Gastrointestinal: No abdominal pain.  No nausea, no vomiting.  No diarrhea.   Genitourinary: Negative for dysuria. Musculoskeletal: Positive for bilateral foot swelling, pain, worse on left. Skin: Negative for rash. Neurological: Negative for headaches, focal weakness or numbness.  ____________________________________________   PHYSICAL  EXAM:  VITAL SIGNS: ED Triage Vitals  Enc Vitals Group     BP 03/26/18 2004 126/63     Pulse Rate 03/26/18 2004 (!) 118     Resp 03/26/18 2004 18     Temp 03/26/18 2004 97.8 F (36.6 C)     Temp Source 03/26/18 2004 Oral     SpO2 03/26/18 2004 96 %     Weight 03/26/18 1933 169 lb (76.7 kg)     Height 03/26/18 1933 5\' 4"  (1.626 m)     Head Circumference --      Peak Flow --      Pain Score 03/26/18 1932 9   Constitutional: Alert and oriented. Well appearing and in no  distress. Eyes: Conjunctivae are normal.  ENT   Head: Normocephalic and atraumatic.   Nose: No congestion/rhinnorhea.   Mouth/Throat: Mucous membranes are moist.   Neck: No stridor. Hematological/Lymphatic/Immunilogical: No cervical lymphadenopathy. Cardiovascular: Tachycardic, regular rhythm.  No murmurs, rubs, or gallops.  Respiratory: Normal respiratory effort without tachypnea nor retractions. Breath sounds are clear and equal bilaterally. No wheezes/rales/rhonchi. Gastrointestinal: Soft and non tender. No rebound. No guarding.  Genitourinary: Deferred Musculoskeletal: Bilateral lower extremity swelling. Left great toe malodorous and weeping. Right lower leg wounds.  Neurologic:  Normal speech and language. No gross focal neurologic deficits are appreciated.  Skin:  Skin is warm, dry and intact. No rash noted. Psychiatric: Mood and affect are normal. Speech and behavior are normal. Patient exhibits appropriate insight and judgment.  ____________________________________________    LABS (pertinent positives/negatives)  CBC wbc 18.6, hgb 10.5, plt 302 Lactic acid 1.8  ____________________________________________   EKG  I, Nance Pear, attending physician, personally viewed and interpreted this EKG  EKG Time: 2122 Rate: 121 Rhythm: sinus tachycardia Axis: left axis deviation Intervals: qtc 496 QRS: RBBB, LAFB ST changes: no st elevation Impression: abnormal ekg   ____________________________________________    RADIOLOGY  Left foot Swelling, no osseous injury.  ____________________________________________   PROCEDURES  Procedures  CRITICAL CARE Performed by: Nance Pear   Total critical care time: 35 minutes  Critical care time was exclusive of separately billable procedures and treating other patients.  Critical care was necessary to treat or prevent imminent or life-threatening deterioration.  Critical care was time spent  personally by me on the following activities: development of treatment plan with patient and/or surrogate as well as nursing, discussions with consultants, evaluation of patient's response to treatment, examination of patient, obtaining history from patient or surrogate, ordering and performing treatments and interventions, ordering and review of laboratory studies, ordering and review of radiographic studies, pulse oximetry and re-evaluation of patient's condition.  ____________________________________________   INITIAL IMPRESSION / ASSESSMENT AND PLAN / ED COURSE  Pertinent labs & imaging results that were available during my care of the patient were reviewed by me and considered in my medical decision making (see chart for details).  Patient presented to the emergency department today because of concerns for lower extremity pain and swelling.  On exam patient has multiple wounds is concerning for infection.  Patient was tachycardic.  White blood cell count did come back elevated to 18.  Patient was written for multiple broad-spectrum antibiotics.  Patient will be given IV fluids and plan on admission to the hospital service.   ____________________________________________   FINAL CLINICAL IMPRESSION(S) / ED DIAGNOSES  Final diagnoses:  Cellulitis, unspecified cellulitis site  Peripheral edema     Note: This dictation was prepared with Dragon dictation. Any transcriptional errors that result  from this process are unintentional     Nance Pear, MD 03/26/18 2109    Nance Pear, MD 04/07/18 [phone]

## 2018-03-26 NOTE — H&P (Signed)
[facility] at [place] NAME: [name]    MR#:  [id number]  DATE OF BIRTH:  [birth year]  DATE OF ADMISSION:  03/26/2018  PRIMARY CARE PHYSICIAN: Marguerita Merles, MD   REQUESTING/REFERRING PHYSICIAN: Archie Balboa, MD  CHIEF COMPLAINT:   Chief Complaint  Patient presents with  . Leg Swelling    HISTORY OF PRESENT ILLNESS:  [name]  is a 67 y.o. female who presents with complaint of lower extremity edema.  Once here in the ED she was found to have a significant left lower extremity wound.  She has followed with podiatry in the past, but has not seen them for a number of months now.  She states that this wound has been present and getting worse over the past month or so but she did not seek treatment.  Imaging obtained in the ED shows no evidence of ostial involvement.  Patient does meet sepsis criteria, and sepsis protocol was initiated and hospitalist called for admission.  PAST MEDICAL HISTORY:   Past Medical History:  Diagnosis Date  . Diabetes mellitus without complication ([place])   . HLD (hyperlipidemia)      PAST SURGICAL HISTORY:   Past Surgical History:  Procedure Laterality Date  . APPENDECTOMY    . TONSILLECTOMY       SOCIAL HISTORY:   Social History   Tobacco Use  . Smoking status: Former Research scientist (life sciences)  . Smokeless tobacco: Never Used  Substance Use Topics  . Alcohol use: No     FAMILY HISTORY:   Family History  Problem Relation Age of Onset  . Diabetes Mother   . Diabetes Father   . Breast cancer Neg Hx      DRUG ALLERGIES:   Allergies  Allergen Reactions  . Penicillins Swelling    MEDICATIONS AT HOME:   Prior to Admission medications   Medication Sig Start Date End Date Taking? Authorizing Provider  ammonium lactate (AMLACTIN) 12 % cream Apply topically as needed for dry skin.    [provider]  aspirin 81 MG chewable tablet Chew by mouth daily.    [provider]  cetirizine  (ZYRTEC) 10 MG tablet Take 10 mg by mouth daily.    [provider]  glipiZIDE (GLUCOTROL) 5 MG tablet Take by mouth daily before breakfast.    [provider]  hydrochlorothiazide (HYDRODIURIL) 25 MG tablet Take 25 mg by mouth daily.    [provider]  levothyroxine (SYNTHROID, LEVOTHROID) 125 MCG tablet Take 125 mcg by mouth daily before breakfast.    [provider]  linagliptin (TRADJENTA) 5 MG TABS tablet Take 5 mg by mouth daily.    [provider]  Magnesium 200 MG TABS Take by mouth.    [provider]  metFORMIN (GLUCOPHAGE) 500 MG tablet Take by mouth 2 (two) times daily with a meal.    [provider]  pravastatin (PRAVACHOL) 20 MG tablet Take 20 mg by mouth daily.    [provider]  quinapril (ACCUPRIL) 40 MG tablet Take 40 mg by mouth at bedtime.    [provider]  triamcinolone ointment (KENALOG) 0.1 % Apply 1 application topically 2 (two) times daily.    [provider]    REVIEW OF SYSTEMS:  Review of Systems  Constitutional: Negative for chills, fever, malaise/fatigue and weight loss.  HENT: Negative for ear pain, hearing loss and tinnitus.   Eyes: Negative for blurred vision, double vision, pain and redness.  Respiratory: Negative for cough, hemoptysis and shortness of breath.   Cardiovascular: Positive for leg swelling. Negative for chest pain, palpitations and orthopnea.  Gastrointestinal: Negative for abdominal pain, constipation, diarrhea, nausea and vomiting.  Genitourinary: Negative for dysuria, frequency and hematuria.  Musculoskeletal: Negative for back pain, joint pain and neck pain.  Skin:       Left lower extremity wound and chronic skin changes on bilateral lower extremities  Neurological: Negative for dizziness, tremors, focal weakness and weakness.  Endo/Heme/Allergies: Negative for polydipsia. Does not bruise/bleed easily.  Psychiatric/Behavioral: Negative for  depression. The patient is not nervous/anxious and does not have insomnia.      VITAL SIGNS:   Vitals:   03/26/18 2030 03/26/18 2130 03/26/18 2200 03/26/18 2230  BP: (!) 103/55 119/68 129/61 (!) 114/52  Pulse: (!) 116 (!) 116  (!) 128  Resp:  11    Temp:      TempSrc:      SpO2: 90% 96%  92%  Weight:      Height:       Wt Readings from Last 3 Encounters:  03/26/18 76.7 kg (169 lb)  01/22/17 89.8 kg (198 lb)  12/31/16 88.9 kg (196 lb)    PHYSICAL EXAMINATION:  Physical Exam  Vitals reviewed. Constitutional: She is oriented to person, place, and time. She appears well-developed and well-nourished. No distress.  HENT:  Head: Normocephalic and atraumatic.  Mouth/Throat: Oropharynx is clear and moist.  Eyes: Pupils are equal, round, and reactive to light. Conjunctivae and EOM are normal. No scleral icterus.  Neck: Normal range of motion. Neck supple. No JVD present. No thyromegaly present.  Cardiovascular: Regular rhythm and intact distal pulses. Exam reveals no gallop and no friction rub.  No murmur heard. Tachycardic  Respiratory: Effort normal and breath sounds normal. No respiratory distress. She has no wheezes. She has no rales.  GI: Soft. Bowel sounds are normal. She exhibits no distension. There is no tenderness.  Musculoskeletal: Normal range of motion. She exhibits edema.  No arthritis, no gout  Lymphadenopathy:    She has no cervical adenopathy.  Neurological: She is alert and oriented to person, place, and time. No cranial nerve deficit.  No dysarthria, no aphasia  Skin: Skin is warm and dry. No rash noted. No erythema.  Left lower extremity wound, as well as significant skin thickening and chronic venous stasis changes on bilateral lower extremities  Psychiatric: She has a normal mood and affect. Her behavior is normal. Judgment and thought content normal.    LABORATORY PANEL:   CBC Recent Labs  Lab 03/26/18 2008  WBC 18.6*  HGB 10.5*  HCT 31.3*  PLT 302    ------------------------------------------------------------------------------------------------------------------  Chemistries  Recent Labs  Lab 03/26/18 2104  NA 132*  K 5.1  CL 102  CO2 22  GLUCOSE 467*  BUN 83*  CREATININE 2.52*  CALCIUM 8.8*   ------------------------------------------------------------------------------------------------------------------  Cardiac Enzymes Recent Labs  Lab 03/26/18 2104  TROPONINI <0.03   ------------------------------------------------------------------------------------------------------------------  RADIOLOGY:  Dg Foot Complete Left  Result Date: 03/26/2018 CLINICAL DATA:  First and second toe diabetic wounds. EXAM: LEFT FOOT - COMPLETE 3+ VIEW COMPARISON:  None. FINDINGS: No acute fracture or dislocation. Pes planovalgus. Mild midfoot osteoarthritis. Osteopenia. Diffuse severe soft tissue swelling. IMPRESSION: 1. Diffuse, severe soft tissue swelling about the foot. No acute osseous abnormality. Electronically Signed   By: Titus Dubin M.D.   On: 03/26/2018 20:17    EKG:   Orders placed or performed during the hospital encounter  of 03/26/18  . ED EKG 12-Lead  . ED EKG 12-Lead  . EKG 12-Lead  . EKG 12-Lead  . EKG 12-Lead  . EKG 12-Lead    IMPRESSION AND PLAN:  Principal Problem:   Sepsis ([place]) -IV antibiotics initiated, lactic acid was within normal limits, blood pressure stable, sepsis is due to diabetic foot infection as below, cultures sent Active Problems:   Diabetic infection of left foot ([facility]) -IV antibiotics initiated including MRSA and Pseudomonas coverage.  Podiatry consult   AKI (acute kidney injury) ([place]) -IV fluids, avoid nephrotoxins and monitor for improvement   Chronic venous insufficiency -certainly has contributed to her potential for development of the wound and infection as above   Type 2 diabetes mellitus with insulin therapy ([place]) -sliding scale insulin with corresponding glucose checks    Hyperlipidemia -home dose antilipid  Chart review performed and case discussed with ED provider. Labs, imaging and/or ECG reviewed by provider and discussed with patient/family. Management plans discussed with the patient and/or family.  DVT PROPHYLAXIS: SubQ heparin  GI PROPHYLAXIS: None  ADMISSION STATUS: Inpatient  CODE STATUS: Full  TOTAL TIME TAKING CARE OF THIS PATIENT: 45 minutes.   [name] 03/26/2018, 11:09 PM  [employer]  [phone]  CC: Primary care physician; Marguerita Merles, MD  Note:  This document was prepared using Dragon voice recognition software and may include unintentional dictation errors.

## 2018-03-27 ENCOUNTER — Other Ambulatory Visit: Payer: Self-pay

## 2018-03-27 DIAGNOSIS — I872 Venous insufficiency (chronic) (peripheral): Secondary | ICD-10-CM

## 2018-03-27 DIAGNOSIS — L8990 Pressure ulcer of unspecified site, unspecified stage: Secondary | ICD-10-CM

## 2018-03-27 DIAGNOSIS — R600 Localized edema: Secondary | ICD-10-CM

## 2018-03-27 LAB — GLUCOSE, CAPILLARY
GLUCOSE-CAPILLARY: 240 mg/dL — AB (ref 65–99)
GLUCOSE-CAPILLARY: 226 mg/dL — AB (ref 65–99)
GLUCOSE-CAPILLARY: 202 mg/dL — AB (ref 65–99)
GLUCOSE-CAPILLARY: 168 mg/dL — AB (ref 65–99)
Glucose-Capillary: 381 mg/dL — ABNORMAL HIGH (ref 65–99)

## 2018-03-27 LAB — CBC
HEMATOCRIT: 32.5 % — AB (ref 35.0–47.0)
Hemoglobin: 10.5 g/dL — ABNORMAL LOW (ref 12.0–16.0)
MCH: 27.6 pg (ref 26.0–34.0)
MCHC: 32.2 g/dL (ref 32.0–36.0)
MCV: 85.6 fL (ref 80.0–100.0)
Platelets: 322 10*3/uL (ref 150–440)
RBC: 3.79 MIL/uL — AB (ref 3.80–5.20)
RDW: 15.1 % — ABNORMAL HIGH (ref 11.5–14.5)
WBC: 23.8 10*3/uL — AB (ref 3.6–11.0)

## 2018-03-27 LAB — BASIC METABOLIC PANEL
Anion gap: 9 (ref 5–15)
BUN: 75 mg/dL — ABNORMAL HIGH (ref 6–20)
CO2: 21 mmol/L — ABNORMAL LOW (ref 22–32)
Calcium: 9.2 mg/dL (ref 8.9–10.3)
Chloride: 106 mmol/L (ref 101–111)
Creatinine, Ser: 2.02 mg/dL — ABNORMAL HIGH (ref 0.44–1.00)
GFR calc Af Amer: 28 mL/min — ABNORMAL LOW (ref >60)
GFR, EST NON AFRICAN AMERICAN: 25 mL/min — AB (ref >60)
GLUCOSE: 216 mg/dL — AB (ref 65–99)
POTASSIUM: 4.4 mmol/L (ref 3.5–5.1)
SODIUM: 136 mmol/L (ref 135–145)

## 2018-03-27 LAB — MRSA PCR SCREENING: MRSA BY PCR: NEGATIVE

## 2018-03-27 MED ORDER — ACETAMINOPHEN 325 MG PO TABS
650.0000 mg | ORAL_TABLET | Freq: Four times a day (QID) | ORAL | Status: DC | PRN
  Administered 2018-03-28: 650 mg via ORAL
  Filled 2018-03-27: qty 2

## 2018-03-27 MED ORDER — ORAL CARE MOUTH RINSE
15.0000 mL | Freq: Two times a day (BID) | OROMUCOSAL | Status: DC
  Administered 2018-03-27 – 2018-04-03 (×10): 15 mL via OROMUCOSAL

## 2018-03-27 MED ORDER — HEPARIN SODIUM (PORCINE) 5000 UNIT/ML IJ SOLN
5000.0000 [IU] | Freq: Three times a day (TID) | INTRAMUSCULAR | Status: DC
  Administered 2018-03-27 – 2018-04-03 (×20): 5000 [IU] via SUBCUTANEOUS
  Filled 2018-03-27 (×21): qty 1

## 2018-03-27 MED ORDER — INSULIN GLARGINE 100 UNIT/ML ~~LOC~~ SOLN
10.0000 [IU] | Freq: Every day | SUBCUTANEOUS | Status: DC
  Administered 2018-03-27: 10 [IU] via SUBCUTANEOUS
  Filled 2018-03-27 (×2): qty 0.1

## 2018-03-27 MED ORDER — PRAVASTATIN SODIUM 20 MG PO TABS
20.0000 mg | ORAL_TABLET | Freq: Every day | ORAL | Status: DC
  Administered 2018-03-28 – 2018-04-03 (×7): 20 mg via ORAL
  Filled 2018-03-27 (×7): qty 1

## 2018-03-27 MED ORDER — OXYCODONE HCL 5 MG PO TABS: 5.0000 mg | ORAL_TABLET | ORAL | Status: DC | PRN

## 2018-03-27 MED ORDER — ONDANSETRON HCL 4 MG PO TABS: 4.0000 mg | ORAL_TABLET | Freq: Four times a day (QID) | ORAL | Status: DC | PRN

## 2018-03-27 MED ORDER — SODIUM CHLORIDE 0.9 % IV SOLN
INTRAVENOUS | Status: AC
  Administered 2018-03-27: 01:00:00 via INTRAVENOUS

## 2018-03-27 MED ORDER — ACETAMINOPHEN 650 MG RE SUPP: 650.0000 mg | Freq: Four times a day (QID) | RECTAL | Status: DC | PRN

## 2018-03-27 MED ORDER — LEVOTHYROXINE SODIUM 25 MCG PO TABS
125.0000 ug | ORAL_TABLET | Freq: Every day | ORAL | Status: DC
  Administered 2018-03-28 – 2018-04-03 (×7): 125 ug via ORAL
  Filled 2018-03-27 (×8): qty 1

## 2018-03-27 MED ORDER — ONDANSETRON HCL 4 MG/2ML IJ SOLN
4.0000 mg | Freq: Four times a day (QID) | INTRAMUSCULAR | Status: DC | PRN
  Administered 2018-03-27 – 2018-04-02 (×3): 4 mg via INTRAVENOUS
  Filled 2018-03-27 (×3): qty 2

## 2018-03-27 MED ORDER — SODIUM CHLORIDE 0.9 % IV SOLN
2.0000 g | INTRAVENOUS | Status: DC
  Administered 2018-03-27 – 2018-03-29 (×3): 2 g via INTRAVENOUS
  Filled 2018-03-27 (×4): qty 2

## 2018-03-27 MED ORDER — ASPIRIN 81 MG PO CHEW
81.0000 mg | CHEWABLE_TABLET | Freq: Every day | ORAL | Status: DC
  Administered 2018-03-28 – 2018-04-03 (×7): 81 mg via ORAL
  Filled 2018-03-27 (×8): qty 1

## 2018-03-27 MED ORDER — INSULIN ASPART 100 UNIT/ML ~~LOC~~ SOLN
0.0000 [IU] | Freq: Three times a day (TID) | SUBCUTANEOUS | Status: DC
  Administered 2018-03-27: 3 [IU] via SUBCUTANEOUS
  Administered 2018-03-27: 9 [IU] via SUBCUTANEOUS
  Administered 2018-03-27: 3 [IU] via SUBCUTANEOUS
  Administered 2018-03-27: 2 [IU] via SUBCUTANEOUS
  Administered 2018-03-27: 3 [IU] via SUBCUTANEOUS
  Administered 2018-03-28: 2 [IU] via SUBCUTANEOUS
  Filled 2018-03-27 (×6): qty 1

## 2018-03-27 MED ORDER — VANCOMYCIN HCL IN DEXTROSE 1-5 GM/200ML-% IV SOLN
1000.0000 mg | INTRAVENOUS | Status: DC
  Administered 2018-03-27 – 2018-03-29 (×2): 1000 mg via INTRAVENOUS
  Filled 2018-03-27 (×3): qty 200

## 2018-03-27 NOTE — Progress Notes (Signed)
[facility] at [place] NAME: [name]    MR#:  [id number]  DATE OF BIRTH:  21-Jun-1951  SUBJECTIVE:  CHIEF COMPLAINT:   Chief Complaint  Patient presents with  . Leg Swelling   Came with worsening swelling and infection in both legs, very severe foul-smelling whole room. REVIEW OF SYSTEMS:  CONSTITUTIONAL: No fever, she has fatigue or weakness.  EYES: No blurred or double vision.  EARS, NOSE, AND THROAT: No tinnitus or ear pain.  RESPIRATORY: No cough, shortness of breath, wheezing or hemoptysis.  CARDIOVASCULAR: No chest pain, orthopnea, edema.  GASTROINTESTINAL: No nausea, vomiting, diarrhea or abdominal pain.  GENITOURINARY: No dysuria, hematuria.  ENDOCRINE: No polyuria, nocturia,  HEMATOLOGY: No anemia, easy bruising or bleeding SKIN: No rash or lesion. MUSCULOSKELETAL: No joint pain or arthritis.  Bilateral leg swelling, pain, infection. NEUROLOGIC: No tingling, numbness, weakness.  PSYCHIATRY: No anxiety or depression.   ROS  DRUG ALLERGIES:   Allergies  Allergen Reactions  . Penicillins Swelling    Has patient had a PCN reaction causing immediate rash, facial/tongue/throat swelling, SOB or lightheadedness with hypotension: Unknown Has patient had a PCN reaction causing severe rash involving mucus membranes or skin necrosis: Unknown Has patient had a PCN reaction that required hospitalization: Unknown Has patient had a PCN reaction occurring within the last 10 years: Unknown If all of the above answers are "NO", then may proceed with Cephalosporin use.     VITALS:  Blood pressure (!) 111/97, pulse 84, temperature 98.2 F (36.8 C), temperature source Oral, resp. rate 18, height 5\' 4"  (1.626 m), weight 72.7 kg (160 lb 4.8 oz), SpO2 100 %.  PHYSICAL EXAMINATION:  GENERAL:  67 y.o.-year-old obese patient lying in the bed with no acute distress.  EYES: Pupils equal, round, reactive to light and accommodation. No scleral  icterus. Extraocular muscles intact.  HEENT: Head atraumatic, normocephalic. Oropharynx and nasopharynx clear.  NECK:  Supple, no jugular venous distention. No thyroid enlargement, no tenderness.  LUNGS: Normal breath sounds bilaterally, no wheezing, rales,rhonchi or crepitation. No use of accessory muscles of respiration.  CARDIOVASCULAR: S1, S2 normal. No murmurs, rubs, or gallops.  ABDOMEN: Soft, nontender, nondistended. Bowel sounds present. No organomegaly or mass.  EXTREMITIES: Bilateral legs chronic skin changes of venous stasis and lymphedema with multiple skin breakdowns and infections and foul-smelling.  NEUROLOGIC: Cranial nerves II through XII are intact. Muscle strength 4/5 in all extremities. Sensation intact. Gait not checked.  PSYCHIATRIC: The patient is alert and oriented x 3.  SKIN: No obvious rash, lesion, or ulcer.   Physical Exam LABORATORY PANEL:   CBC Recent Labs  Lab 03/27/18 0316  WBC 23.8*  HGB 10.5*  HCT 32.5*  PLT 322   ------------------------------------------------------------------------------------------------------------------  Chemistries  Recent Labs  Lab 03/27/18 0316  NA 136  K 4.4  CL 106  CO2 21*  GLUCOSE 216*  BUN 75*  CREATININE 2.02*  CALCIUM 9.2   ------------------------------------------------------------------------------------------------------------------  Cardiac Enzymes Recent Labs  Lab 03/26/18 2104  TROPONINI <0.03   ------------------------------------------------------------------------------------------------------------------  RADIOLOGY:  Dg Foot Complete Left  Result Date: 03/26/2018 CLINICAL DATA:  First and second toe diabetic wounds. EXAM: LEFT FOOT - COMPLETE 3+ VIEW COMPARISON:  None. FINDINGS: No acute fracture or dislocation. Pes planovalgus. Mild midfoot osteoarthritis. Osteopenia. Diffuse severe soft tissue swelling. IMPRESSION: 1. Diffuse, severe soft tissue swelling about the foot. No acute osseous  abnormality. Electronically Signed   By: Titus Dubin M.D.   On: 03/26/2018 20:17  ASSESSMENT AND PLAN:   Principal Problem:   Diabetic infection of left foot ([place]) Active Problems:   Chronic venous insufficiency   Type 2 diabetes mellitus with insulin therapy ([place])   Hyperlipidemia   Sepsis ([place])   AKI (acute kidney injury) ([place])   Pressure injury of skin  * Sepsis ([place]) -IV antibiotics initiated, lactic acid was within normal limits, blood pressure stable, sepsis is due to diabetic foot infection as below, cultures sent  *  Diabetic infection of left foot ([place]) -IV antibiotics initiated including MRSA and Pseudomonas coverage.  Podiatry consult appreciated, suggested vascular and dermatology consult which are called.  *  AKI (acute kidney injury) ([place]) -IV fluids, avoid nephrotoxins and monitor for improvement  *  Chronic venous insufficiency -certainly has contributed to her potential for development of the wound and infection as above     Vascular consult.  *  Type 2 diabetes mellitus with insulin therapy ([facility]) -sliding scale insulin with corresponding glucose checks  *  Hyperlipidemia -home dose antilipid   PT evaluation to help with discharge planning.   All the records are reviewed and case discussed with Care Management/Social Workerr. Management plans discussed with the patient, family and they are in agreement.  CODE STATUS: Full code.  TOTAL TIME TAKING CARE OF THIS PATIENT: 35 minutes.     POSSIBLE D/C IN 1-2 DAYS, DEPENDING ON CLINICAL CONDITION.   Vaughan Basta M.D on 03/27/2018   Between 7am to 6pm - Pager - [phone]  After 6pm go to [email or web address] - password [email or web address] [facility]  Office  [phone]  CC: Primary care physician; Yolanda Merles, MD  Note: This dictation was prepared with Dragon dictation along with smaller phrase technology. Any transcriptional errors that result from this process are  unintentional.

## 2018-03-27 NOTE — Progress Notes (Signed)
Talked to Dr. Anselm Jungling about patient's NPO status, podiatry and vascular surgery rounded on patient and no procedure plan, order to start carb modified and heart healthy diet. RN will continue to monitor.

## 2018-03-27 NOTE — Consult Note (Signed)
[facility] Nurse wound consult note Reason for Consult:LE Wounds Wound type: Patient with chronic venous stasis and lymphedema component. She has not worn compression stockings and has not had compression wraps.    Pressure Injury POA: NA  Wound bed: Extensive weeping on the posterior right calf with partial thickness skin loss that is patchy over this area  Hyperkeratotic skin lesions and lichenifications bilaterally, but worse on the LLE. Fungal toenails that are long with webspace maceration, however podiatry has placed gauze woven between her toes for this.  Due to the edema with weeping and presence of cellulitis I will not provide compression therapy until she has further work up for any mixed etiology venous vs arterial.  She presents with venous skin changes though.  Drainage (amount, consistency, odor) weeping, serous fluids on the bed linens with musty odor common with LE venous stasis and poor hygiene.  Periwound: I am not able to palpate her distal pulses today, but her feet are warm.  Dressing procedure/placement/frequency: Pending vascular or dermatology work up per podiatrist and Dr. Anselm Jungling who was able to review the podiatrist notes with the [facility] nurse today.  If compression therapy desired, would suggest Unna's boots bilaterally and follow up in a wound care center of the paitent's choice, however it should be noted patient is incontinent and was saturated with urine upon arrival. May need higher level of care and compression wraps could be detrimental if they are going to be wet from urine draining down the legs.   [facility] Nurse team will follow along with you for weekly wound assessments.  Please notify me of any acute changes in the wounds or any new areas of concerns [place] MSN, RN,CWOCN, [place], [place]

## 2018-03-27 NOTE — ED Notes (Signed)
Patient taken by by Kirke Shaggy, RN

## 2018-03-27 NOTE — Consult Note (Signed)
[facility]                                                      Patient Demographics  [name], is a 67 y.o. female   MRN: [id number]   DOB - December 19, 1951  Admit Date - 03/26/2018    Outpatient Primary MD for the patient is Marguerita Merles, MD  Consult requested in the [facility] by Vaughan Basta, *, On 03/27/2018    Reason for consult patient consulted because of abnormal skin on both feet.  She was admitted for cellulitis both legs.   With History of -  Past Medical History:  Diagnosis Date  . Diabetes mellitus without complication ([place])   . HLD (hyperlipidemia)       Past Surgical History:  Procedure Laterality Date  . APPENDECTOMY    . TONSILLECTOMY      in for   Chief Complaint  Patient presents with  . Leg Swelling     HPI  [name]  is a 67 y.o. female, history of abnormal skin and nails to both feet she has had some cellulitis and quite a bit of edema and swelling to both legs.  She saw DrCline probably 8 months ago but never came back to see him.  She does have diabetes    Review of Systems patient is alert well oriented and responds appropriately to questions today.  In addition to the HPI above,  No Fever-chills, No Headache, No changes with Vision or hearing, No problems swallowing food or Liquids, No Chest pain, Cough or Shortness of Breath, No Abdominal pain, No Nausea or Vommitting, Bowel movements are regular, No Blood in stool or Urine, No dysuria, No new skin rashes or bruises, No new joints pains-aches,  No new weakness, tingling, numbness in any extremity, No recent weight gain or loss, No polyuria, polydypsia or polyphagia, No significant Mental Stressors.  A full 10 point Review of Systems was done, except as stated above, all other Review of Systems were negative.   Social History Social  History   Tobacco Use  . Smoking status: Former Research scientist (life sciences)  . Smokeless tobacco: Never Used  Substance Use Topics  . Alcohol use: No    Family History Family History  Problem Relation Age of Onset  . Diabetes Mother   . Diabetes Father   . Breast cancer Neg Hx     Prior to Admission medications   Medication Sig Start Date End Date Taking? Authorizing Provider  ammonium lactate (AMLACTIN) 12 % cream Apply topically as needed for dry skin.    [provider]  aspirin 81 MG chewable tablet Chew by mouth daily.    [provider]  cetirizine (ZYRTEC) 10 MG tablet Take 10 mg by mouth daily.    [provider]  glipiZIDE (GLUCOTROL) 5 MG tablet Take by mouth daily before breakfast.    [provider]  hydrochlorothiazide (HYDRODIURIL) 25 MG tablet Take 25 mg by mouth daily.    [provider]  levothyroxine (SYNTHROID, LEVOTHROID) 125 MCG tablet Take 125 mcg by mouth daily before breakfast.    [provider]  linagliptin (TRADJENTA) 5 MG TABS tablet Take 5 mg by mouth daily.    [provider]  Magnesium 200 MG TABS Take by mouth.    [provider]  metFORMIN (GLUCOPHAGE) 500 MG tablet Take by mouth 2 (two) times daily with a meal.    [provider]  pravastatin (PRAVACHOL) 20 MG tablet Take 20 mg by mouth daily.    [provider]  quinapril (ACCUPRIL) 40 MG tablet Take 40 mg by mouth at bedtime.    [provider]  triamcinolone ointment (KENALOG) 0.1 % Apply 1 application topically 2 (two) times daily.    [provider]    Anti-infectives (From admission, onward)   Start     Dose/Rate Route Frequency Ordered Stop   03/27/18 1800  ceFEPIme (MAXIPIME) 2 g in sodium chloride 0.9 % 100 mL IVPB     2 g 200 mL/hr over 30 Minutes Intravenous Every 24 hours 03/26/18 2337     03/27/18 1600  vancomycin (VANCOCIN) IVPB 1000 mg/200 mL premix     1,000 mg 200 mL/hr over 60 Minutes  Intravenous Every 36 hours 03/26/18 2337     03/26/18 2115  ceFEPIme (MAXIPIME) 2 g in sodium chloride 0.9 % 100 mL IVPB     2 g 200 mL/hr over 30 Minutes Intravenous  Once 03/26/18 2100 03/26/18 2209   03/26/18 2045  vancomycin (VANCOCIN) IVPB 1000 mg/200 mL premix     1,000 mg 200 mL/hr over 60 Minutes Intravenous  Once 03/26/18 2036 03/26/18 2309      Scheduled Meds: . aspirin  81 mg Oral Daily  . heparin  5,000 Units Subcutaneous Q8H  . insulin aspart  0-9 Units Subcutaneous TID AC & HS  . insulin glargine  10 Units Subcutaneous Daily  . levothyroxine  125 mcg Oral QAC breakfast  . mouth rinse  15 mL Mouth Rinse BID  . pravastatin  20 mg Oral Daily   Continuous Infusions: . sodium chloride 100 mL/hr at 03/27/18 0037  . ceFEPime (MAXIPIME) IV    . vancomycin     PRN Meds:.acetaminophen **OR** acetaminophen, ondansetron **OR** ondansetron (ZOFRAN) IV, oxyCODONE  Allergies  Allergen Reactions  . Penicillins Swelling    Physical Exam  Vitals  Blood pressure (!) 119/59, pulse (!) 120, temperature 99.2 F (37.3 C), temperature source Oral, resp. rate 19, height 5\' 4"  (1.626 m), weight 72.7 kg (160 lb 4.8 oz), SpO2 96 %.  Lower Extremity exam:  Vascular: Difficult to palpate pulses due to excessive edema and lichenification of skin on feet and lower legs.  Severe venous stasis and lymphedema bilaterally.  Dermatological: Extremely thickened hyperkeratotic skin as noted lichenification of the skin on her feet and lower legs.  She has some evidence of previous ulceration on her lower legs.  Currently I do not see any type of open wounds on the foot.  There is some maceration in between the toes.  Nails are extremely long and abnormal.  Exhibit thickening and deformity and subungual debris  Neurological: Deferred at present  Ortho: No gross deformities  Data Review  CBC Recent Labs  Lab 03/26/18 2008 03/27/18 0316  WBC 18.6* 23.8*  HGB 10.5* 10.5*  HCT 31.3* 32.5*   PLT 302 322  MCV 84.7 85.6  MCH 28.4 27.6  MCHC 33.5 32.2  RDW 15.6* 15.1*  LYMPHSABS 0.6*  --   MONOABS 1.0*  --   EOSABS 0.0  --   BASOSABS 0.0  --    ------------------------------------------------------------------------------------------------------------------  Chemistries  Recent Labs  Lab 03/26/18 2104 03/27/18 0316  NA 132* 136  K 5.1 4.4  CL 102 106  CO2 22 21*  GLUCOSE 467* 216*  BUN  83* 75*  CREATININE 2.52* 2.02*  CALCIUM 8.8* 9.2   ------------------------------------------------------------------------------------------------------------------ estimated creatinine clearance is 26.8 mL/min (A) (by C-G formula based on SCr of 2.02 mg/dL (H)). ------------------------------------------------------------------------------------------------------------------ No results for input(s): TSH, T4TOTAL, T3FREE, THYROIDAB in the last 72 hours.  Invalid input(s): FREET3 Urinalysis No results found for: COLORURINE, APPEARANCEUR, Schubert, [place], GLUCOSEU, HGBUR, BILIRUBINUR, KETONESUR, PROTEINUR, UROBILINOGEN, NITRITE, LEUKOCYTESUR   Imaging results:   Dg Foot Complete Left  Result Date: 03/26/2018 CLINICAL DATA:  First and second toe diabetic wounds. EXAM: LEFT FOOT - COMPLETE 3+ VIEW COMPARISON:  None. FINDINGS: No acute fracture or dislocation. Pes planovalgus. Mild midfoot osteoarthritis. Osteopenia. Diffuse severe soft tissue swelling. IMPRESSION: 1. Diffuse, severe soft tissue swelling about the foot. No acute osseous abnormality. Electronically Signed   By: Titus Dubin M.D.   On: 03/26/2018 20:17    Assessment & Plan: On exam I do not see an actual wound to the foot.  Lower legs have some raw skin around the years due to her venous stasis and chronic venous stasis edema as well as a severe lichenification of both feet and legs.  I think the patient needs evaluation by both vascular as well as dermatology.  She needs long-term control of the swelling in both  legs and likely needs dermatology to evaluate for the lichenification of the skin of the legs and feet.  Currently I will put some gauze in between the toes just to keep the macerated skin little drier.  She needs chronic improved care of both lower extremities.  Nails are extremely deformed I will cut this off likely tomorrow. Principal Problem:   Diabetic infection of left foot ([place]) Active Problems:   Chronic venous insufficiency   Type 2 diabetes mellitus with insulin therapy ([place])   Hyperlipidemia   Sepsis ([place])   AKI (acute kidney injury) ([place])   Pressure injury of skin   Family Communication: Plan discussed with patient  Albertine Patricia M.D on 03/27/2018 at 12:18 PM  Thank you for the consult, we will follow the patient with you in the Hospital.

## 2018-03-27 NOTE — Progress Notes (Signed)
CODE SEPSIS - PHARMACY COMMUNICATION  **Broad Spectrum Antibiotics should be administered within 1 hour of Sepsis diagnosis**  Time Code Sepsis Called/Page Received: 4/4 2110  Antibiotics Ordered: 4/4 2100 cefepime  Time of 1st antibiotic administration: 4/4 2142  Additional action taken by pharmacy:   If necessary, Name of Provider/Nurse Contacted:     Eloise Harman ,PharmD Clinical Pharmacist  03/27/2018  12:14 AM

## 2018-03-27 NOTE — Consult Note (Signed)
[facility] Vascular Consult Note  MRN : [id number]  [name] is a 67 y.o. ([birth year]) female who presents with chief complaint of  Chief Complaint  Patient presents with  . Leg Swelling   History of Present Illness:  The patient is a 67 year old female with a past medical history type 2 diabetes mellitus, hyperlipidemia, chronic venous insufficiency and lymphedema to the bilateral lower extremity who resides in a nursing home facility.  The patient was sent to the emergency department by her nursing home for evaluation of progressively worsening bilateral lower extremity leg and foot swelling.  The patient is a poor historian.  Patient states that her swelling has been present for only "one month".  The patient also states that she has never been to a doctor for her lower extremity swelling.  The patient was seen in our office and of 2017 for evaluation of bilateral lower extremity edema.  The patient was placed in bilateral Unna wraps for approximately 6 weeks.  This gained control of the patient's edema and she was then transitioned into medical grade 1 compression socks.  During this time, the patient underwent a bilateral venous duplex which was notable for chronic venous insufficiency in both the deep and superficial systems.  The patient was also diagnosed with lymphedema.  The patient is a candidate for both endovenous laser ablation and/or a lymphedema pump however she did not follow-up.  The patient states that she does not ambulate much and denies any claudication-like symptoms.  The patient notes she experiences pain to her lower extremity intermittently.  She denies rest pain. Patient denies ulcer formation to her lower extremity.  Patient denies any fever, nausea vomiting.  The patient states she does not wear compression socks nor does she elevate her legs.  The vascular surgery service was consulted by Dr. Anselm Jungling for further  recommendations.  Current Facility-Administered Medications  Medication Dose Route Frequency Provider Last Rate Last Dose  . acetaminophen (TYLENOL) tablet 650 mg  650 mg Oral Q6H PRN Lance Coon, MD       Or  . acetaminophen (TYLENOL) suppository 650 mg  650 mg Rectal Q6H PRN Lance Coon, MD      . aspirin chewable tablet 81 mg  81 mg Oral Daily Lance Coon, MD      . ceFEPIme (MAXIPIME) 2 g in sodium chloride 0.9 % 100 mL IVPB  2 g Intravenous Q24H Vaughan Basta, MD      . heparin injection 5,000 Units  5,000 Units Subcutaneous Camelia Phenes Lance Coon, MD   5,000 Units at 03/27/18 1319  . insulin aspart (novoLOG) injection 0-9 Units  0-9 Units Subcutaneous TID AC & HS Lance Coon, MD   3 Units at 03/27/18 1146  . insulin glargine (LANTUS) injection 10 Units  10 Units Subcutaneous Daily Vaughan Basta, MD   10 Units at 03/27/18 1147  . levothyroxine (SYNTHROID, LEVOTHROID) tablet 125 mcg  125 mcg Oral QAC breakfast Lance Coon, MD      . MEDLINE mouth rinse  15 mL Mouth Rinse BID Lance Coon, MD   15 mL at 03/27/18 0836  . ondansetron (ZOFRAN) tablet 4 mg  4 mg Oral Q6H PRN Lance Coon, MD       Or  . ondansetron [facility]) injection 4 mg  4 mg Intravenous Q6H PRN Lance Coon, MD   4 mg at 03/27/18 0835  . oxyCODONE (Oxy IR/ROXICODONE) immediate release tablet 5 mg  5 mg Oral Q4H PRN Lance Coon,  MD      . pravastatin (PRAVACHOL) tablet 20 mg  20 mg Oral Daily Lance Coon, MD      . vancomycin (VANCOCIN) IVPB 1000 mg/200 mL premix  1,000 mg Intravenous Q36H Vaughan Basta, MD       Past Medical History:  Diagnosis Date  . Diabetes mellitus without complication ([place])   . HLD (hyperlipidemia)    Past Surgical History:  Procedure Laterality Date  . APPENDECTOMY    . TONSILLECTOMY     Social History Social History   Tobacco Use  . Smoking status: Former Research scientist (life sciences)  . Smokeless tobacco: Never Used  Substance Use Topics  . Alcohol use: No  . Drug  use: No   Family History Family History  Problem Relation Age of Onset  . Diabetes Mother   . Diabetes Father   . Breast cancer Neg Hx   The patient denies any family history peripheral artery disease, venous disease or eating/clotting disorder.  Allergies  Allergen Reactions  . Penicillins Swelling    Has patient had a PCN reaction causing immediate rash, facial/tongue/throat swelling, SOB or lightheadedness with hypotension: Unknown Has patient had a PCN reaction causing severe rash involving mucus membranes or skin necrosis: Unknown Has patient had a PCN reaction that required hospitalization: Unknown Has patient had a PCN reaction occurring within the last 10 years: Unknown If all of the above answers are "NO", then may proceed with Cephalosporin use.    REVIEW OF SYSTEMS (Negative unless checked)  Constitutional: [] Weight loss  [] Fever  [] Chills Cardiac: [] Chest pain   [] Chest pressure   [] Palpitations   [] Shortness of breath when laying flat   [] Shortness of breath at rest   [] Shortness of breath with exertion. Vascular:  [] Pain in legs with walking   [] Pain in legs at rest   [] Pain in legs when laying flat   [] Claudication   [] Pain in feet when walking  [] Pain in feet at rest  [] Pain in feet when laying flat   [] History of DVT   [x] Phlebitis   [x] Swelling in legs   [] Varicose veins   [] Non-healing ulcers Pulmonary:   [] Uses home oxygen   [] Productive cough   [] Hemoptysis   [] Wheeze  [] COPD   [] Asthma Neurologic:  [] Dizziness  [] Blackouts   [] Seizures   [] History of stroke   [] History of TIA  [] Aphasia   [] Temporary blindness   [] Dysphagia   [] Weakness or numbness in arms   [] Weakness or numbness in legs Musculoskeletal:  [] Arthritis   [] Joint swelling   [] Joint pain   [] Low back pain Hematologic:  [] Easy bruising  [] Easy bleeding   [] Hypercoagulable state   [] Anemic  [] Hepatitis Gastrointestinal:  [] Blood in stool   [] Vomiting blood  [] Gastroesophageal reflux/heartburn    [] Difficulty swallowing. Genitourinary:  [] Chronic kidney disease   [] Difficult urination  [] Frequent urination  [] Burning with urination   [] Blood in urine Skin:  [] Rashes   [] Ulcers   [x] Wounds Psychological:  [] History of anxiety   []  History of major depression.  Physical Examination  Vitals:   03/27/18 0036 03/27/18 0330 03/27/18 0337 03/27/18 0742  BP: 115/63 (!) 69/51 118/71 (!) 119/59  Pulse: (!) 128 (!) 113 (!) 122 (!) 120  Resp:  19    Temp:  97.8 F (36.6 C) 99.2 F (37.3 C)   TempSrc:   Oral   SpO2:  96% 96% 96%  Weight: 160 lb 4.8 oz (72.7 kg)     Height: 5\' 4"  (1.626 m)  Body mass index is 27.52 kg/m. Gen:  WD/WN, NAD Head: [place]/AT, No temporalis wasting. Prominent temp pulse not noted. Ear/Nose/Throat: Hearing grossly intact, nares w/o erythema or drainage, oropharynx w/o Erythema/Exudate Eyes: Sclera non-icteric, conjunctiva clear Neck: Trachea midline.  No JVD.  Pulmonary:  Good air movement, respirations not labored, equal bilaterally.  Cardiac: RRR, normal S1, S2. Vascular:  Vessel Right Left  Radial Palpable Palpable  Ulnar Palpable Palpable  Brachial Palpable Palpable  Carotid Palpable, without bruit Palpable, without bruit  Aorta Not palpable N/A  Femoral Palpable Palpable  Popliteal Non-Palpable Non-Palpable  PT Non-Palpable Non-Palpable  DP Non-Palpable Non-Palpable   Right lower extremity: Mild to moderate 1+ pitting edema noted.  Skin loss noted to the calf.  Severe stasis dermatitis noted.  Skin thickening noted.  Extremity long toenails.  Hard to palpate pedal pulses however the foot is warm.  Cellulitic.  Weeping serous fluid.  Motor/sensory intact.  Left lower extremity: Moderate the 2+ pitting edema noted.  Severe stasis dermatitis noted.  Skin thickening noted.  Extremity long toenails.  Hard to palpate pedal pulses however the foot is warm.  Cellulitic.  Weeping serous fluid.  Motor/sensory intact.  Gastrointestinal: soft,  non-tender/non-distended. No guarding/reflex.  Musculoskeletal: M/S 5/5 throughout.  Extremities without ischemic changes.  No deformity or atrophy.  Neurologic: Sensation grossly intact in extremities.  Symmetrical.  Speech is fluent. Motor exam as listed above. Psychiatric: Judgment intact, Mood & affect appropriate for pt's clinical situation. Lymph : No Cervical, Axillary, or Inguinal lymphadenopathy.  CBC Lab Results  Component Value Date   WBC 23.8 (H) 03/27/2018   HGB 10.5 (L) 03/27/2018   HCT 32.5 (L) 03/27/2018   MCV 85.6 03/27/2018   PLT 322 03/27/2018   BMET    Component Value Date/Time   NA 136 03/27/2018 0316   NA 136 03/06/2013 1122   K 4.4 03/27/2018 0316   K 4.3 03/06/2013 1122   CL 106 03/27/2018 0316   CL 106 03/06/2013 1122   CO2 21 (L) 03/27/2018 0316   CO2 26 03/06/2013 1122   GLUCOSE 216 (H) 03/27/2018 0316   GLUCOSE 243 (H) 03/06/2013 1122   BUN 75 (H) 03/27/2018 0316   BUN 17 03/06/2013 1122   CREATININE 2.02 (H) 03/27/2018 0316   CREATININE 1.00 03/06/2013 1122   CALCIUM 9.2 03/27/2018 0316   CALCIUM 9.1 03/06/2013 1122   GFRNONAA 25 (L) 03/27/2018 0316   GFRNONAA >60 03/06/2013 1122   GFRAA 28 (L) 03/27/2018 0316   GFRAA >60 03/06/2013 1122   Estimated Creatinine Clearance: 26.8 mL/min (A) (by C-G formula based on SCr of 2.02 mg/dL (H)).  COAG No results found for: INR, PROTIME  Radiology Dg Foot Complete Left  Result Date: 03/26/2018 CLINICAL DATA:  First and second toe diabetic wounds. EXAM: LEFT FOOT - COMPLETE 3+ VIEW COMPARISON:  None. FINDINGS: No acute fracture or dislocation. Pes planovalgus. Mild midfoot osteoarthritis. Osteopenia. Diffuse severe soft tissue swelling. IMPRESSION: 1. Diffuse, severe soft tissue swelling about the foot. No acute osseous abnormality. Electronically Signed   By: Titus Dubin M.D.   On: 03/26/2018 20:17   Assessment/Plan The patient is a 67 year old female with a past medical history type 2 diabetes  mellitus, hyperlipidemia, chronic venous insufficiency and lymphedema to the bilateral lower extremity who resides in a nursing home facility.  The patient was seen and treated in our office approximately 1 year ago for venous insufficiency and lymphedema - stable. 1. Chronic venous insufficiency: Patient was seen and treated in  our office approximately 1 year ago with 6 weeks of unna wraps then transition to medical grade one compression socks.  The patient was encouraged to wear her compression socks on a daily basis and elevate her legs.  The patient is a candidate for endovenous laser ablation to the bilateral great saphenous veins.  The patient also has venous insufficiency to the deep venous system.  Unfortunately the patient never followed up in our office to pursue this.  We can pursue this again as an outpatient. 2.  Lymphedema: Patient was seen and treated in our office approximately 1 year ago with 6 weeks of unna wraps then transition to medical grade one compression socks.  The patient was encouraged to wear her compression socks on a daily basis and elevate her legs.  The patient is a candidate for a lymphedema pump however did not follow-up to pursue this added therapy.  We can pursue this again as an outpatient. 3.  Lower extremity edema: Recommend 3 layer zinc oxide Unna wraps to the bilateral lower extremity changed at least once a week.  The patient is most likely experiencing an exacerbation of her lymphedema and venous insufficiency.  The patient should elevate her legs heart level or higher as much as possible. 4.  Cellulitis: Agree with current regimen of IV antibiotics 5.  Hyperkeratotic Lesions: Agree with consult to dermatology 6.  Peripheral artery disease: The patient should follow-up with [place] as an outpatient and undergo an ABI possible bilateral lower extremity arterial duplex to assess for any contributing peripheral artery disease.  At this time there is no indication for  endovascular intervention as the patient can follow-up in our office for noninvasive studies.  Discussed with Dr. Mayme Genta, PA-C  03/27/2018 3:57 PM  This note was created with Dragon medical transcription system.  Any error is purely unintentional

## 2018-03-28 ENCOUNTER — Encounter
Admission: EM | Disposition: A | Discharge status: Skilled Nursing Facility | Payer: Self-pay | Source: Home / Self Care | Attending: Specialist

## 2018-03-28 LAB — GLUCOSE, CAPILLARY
GLUCOSE-CAPILLARY: 118 mg/dL — AB (ref 65–99)
GLUCOSE-CAPILLARY: 128 mg/dL — AB (ref 65–99)
GLUCOSE-CAPILLARY: 237 mg/dL — AB (ref 65–99)
Glucose-Capillary: 123 mg/dL — ABNORMAL HIGH (ref 65–99)
Glucose-Capillary: 153 mg/dL — ABNORMAL HIGH (ref 65–99)
Glucose-Capillary: 223 mg/dL — ABNORMAL HIGH (ref 65–99)

## 2018-03-28 LAB — CREATININE, SERUM
Creatinine, Ser: 1.99 mg/dL — ABNORMAL HIGH (ref 0.44–1.00)
GFR, EST AFRICAN AMERICAN: 29 mL/min — AB (ref >60)
GFR, EST NON AFRICAN AMERICAN: 25 mL/min — AB (ref >60)

## 2018-03-28 SURGERY — IRRIGATION AND DEBRIDEMENT FOOT
Anesthesia: Choice | Laterality: Left

## 2018-03-28 MED ORDER — SODIUM CHLORIDE 0.9 % IV SOLN
INTRAVENOUS | Status: DC
  Administered 2018-03-28 – 2018-03-31 (×5): via INTRAVENOUS

## 2018-03-28 MED ORDER — INSULIN ASPART 100 UNIT/ML ~~LOC~~ SOLN
0.0000 [IU] | Freq: Three times a day (TID) | SUBCUTANEOUS | Status: DC
  Administered 2018-03-28: 3 [IU] via SUBCUTANEOUS
  Administered 2018-03-29 (×2): 2 [IU] via SUBCUTANEOUS
  Administered 2018-03-29: 7 [IU] via SUBCUTANEOUS
  Administered 2018-03-30 (×2): 2 [IU] via SUBCUTANEOUS
  Administered 2018-03-30 – 2018-03-31 (×2): 3 [IU] via SUBCUTANEOUS
  Administered 2018-03-31: 2 [IU] via SUBCUTANEOUS
  Administered 2018-03-31: 1 [IU] via SUBCUTANEOUS
  Administered 2018-04-01: 5 [IU] via SUBCUTANEOUS
  Administered 2018-04-01 – 2018-04-03 (×6): 2 [IU] via SUBCUTANEOUS
  Filled 2018-03-28 (×20): qty 1

## 2018-03-28 MED ORDER — INSULIN ASPART 100 UNIT/ML ~~LOC~~ SOLN
0.0000 [IU] | Freq: Every day | SUBCUTANEOUS | Status: DC
  Administered 2018-03-28: 2 [IU] via SUBCUTANEOUS
  Administered 2018-03-29: 4 [IU] via SUBCUTANEOUS
  Administered 2018-03-31: 2 [IU] via SUBCUTANEOUS
  Administered 2018-04-01: 3 [IU] via SUBCUTANEOUS
  Filled 2018-03-28 (×4): qty 1

## 2018-03-28 MED ORDER — DRONABINOL 2.5 MG PO CAPS
2.5000 mg | ORAL_CAPSULE | Freq: Two times a day (BID) | ORAL | Status: DC
  Administered 2018-03-28 – 2018-04-03 (×12): 2.5 mg via ORAL
  Filled 2018-03-28 (×13): qty 1

## 2018-03-28 MED ORDER — ENSURE ENLIVE PO LIQD
237.0000 mL | Freq: Two times a day (BID) | ORAL | Status: DC
  Administered 2018-03-28 – 2018-03-30 (×5): 237 mL via ORAL
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
## 2018-03-28 NOTE — Progress Notes (Signed)
[facility] Podiatry                                                      Patient Demographics  [name], is a 67 y.o. female   MRN: [id number]   DOB - 1951-07-31  Admit Date - 03/26/2018    Outpatient Primary MD for the patient is Marguerita Merles, MD  Consult requested in the Hospital by Henreitta Leber, MD, On 03/28/2018   With History of -  Past Medical History:  Diagnosis Date  . Diabetes mellitus without complication ([place])   . HLD (hyperlipidemia)       Past Surgical History:  Procedure Laterality Date  . APPENDECTOMY    . TONSILLECTOMY      in for   Chief Complaint  Patient presents with  . Leg Swelling     HPI  [name]  is a 67 y.o. female, follow-up on patient for severe changes in skin and nails to both feet.  She has some chronic venous stasis lymphedema with secondary skin changes.  She does not control this very well.  Social History Social History   Tobacco Use  . Smoking status: Former Research scientist (life sciences)  . Smokeless tobacco: Never Used  Substance Use Topics  . Alcohol use: No    Family History Family History  Problem Relation Age of Onset  . Diabetes Mother   . Diabetes Father   . Breast cancer Neg Hx     Prior to Admission medications   Medication Sig Start Date End Date Taking? Authorizing Provider  ammonium lactate (AMLACTIN) 12 % cream Apply topically as needed for dry skin.   Yes [provider]  hydrochlorothiazide (HYDRODIURIL) 25 MG tablet Take 25 mg by mouth daily.   Yes [provider]  linagliptin (TRADJENTA) 5 MG TABS tablet Take 5 mg by mouth daily.   Yes [provider]  magnesium oxide (MAG-OX) 400 MG tablet Take 400 mg by mouth 2 (two) times daily.   Yes [provider]  triamcinolone ointment (KENALOG) 0.1 % Apply 1 application topically 2 (two) times daily.   Yes  [provider]  aspirin 81 MG chewable tablet Chew 81 mg by mouth daily.     [provider]  cetirizine (ZYRTEC) 10 MG tablet Take 10 mg by mouth daily.    [provider]  glipiZIDE (GLUCOTROL) 5 MG tablet Take by mouth daily before breakfast.    [provider]  levothyroxine (SYNTHROID, LEVOTHROID) 25 MCG tablet Take 25 mcg by mouth daily before breakfast.     [provider]  metFORMIN (GLUCOPHAGE-XR) 500 MG 24 hr tablet Take 500 mg by mouth daily with breakfast.    [provider]    Anti-infectives (From admission, onward)   Start     Dose/Rate Route Frequency Ordered Stop   03/27/18 1800  ceFEPIme (MAXIPIME) 2 g in sodium chloride 0.9 % 100 mL IVPB  Status:  Discontinued     2 g 200 mL/hr over 30 Minutes Intravenous Every 24 hours 03/26/18 2337 03/27/18 1452   03/27/18 1800  ceFEPIme (MAXIPIME) 2 g in sodium chloride 0.9 % 100 mL IVPB     2 g 200 mL/hr over 30 Minutes Intravenous Every 24 hours 03/27/18 1456     03/27/18 1600  vancomycin (VANCOCIN) IVPB 1000  mg/200 mL premix  Status:  Discontinued     1,000 mg 200 mL/hr over 60 Minutes Intravenous Every 36 hours 03/26/18 2337 03/27/18 1452   03/27/18 1600  vancomycin (VANCOCIN) IVPB 1000 mg/200 mL premix     1,000 mg 200 mL/hr over 60 Minutes Intravenous Every 36 hours 03/27/18 1456     03/26/18 2115  ceFEPIme (MAXIPIME) 2 g in sodium chloride 0.9 % 100 mL IVPB     2 g 200 mL/hr over 30 Minutes Intravenous  Once 03/26/18 2100 03/26/18 2209   03/26/18 2045  vancomycin (VANCOCIN) IVPB 1000 mg/200 mL premix     1,000 mg 200 mL/hr over 60 Minutes Intravenous  Once 03/26/18 2036 03/26/18 2309      Scheduled Meds: . aspirin  81 mg Oral Daily  . heparin  5,000 Units Subcutaneous Q8H  . insulin aspart  0-9 Units Subcutaneous TID AC & HS  . insulin glargine  10 Units Subcutaneous Daily  . levothyroxine  125 mcg Oral QAC breakfast  . mouth rinse  15 mL Mouth Rinse BID  .  pravastatin  20 mg Oral Daily   Continuous Infusions: . ceFEPime (MAXIPIME) IV Stopped (03/27/18 1811)  . vancomycin Stopped (03/27/18 1820)   PRN Meds:.acetaminophen **OR** acetaminophen, ondansetron **OR** ondansetron (ZOFRAN) IV, oxyCODONE  Allergies  Allergen Reactions  . Penicillins Swelling    Has patient had a PCN reaction causing immediate rash, facial/tongue/throat swelling, SOB or lightheadedness with hypotension: Unknown Has patient had a PCN reaction causing severe rash involving mucus membranes or skin necrosis: Unknown Has patient had a PCN reaction that required hospitalization: Unknown Has patient had a PCN reaction occurring within the last 10 years: Unknown If all of the above answers are "NO", then may proceed with Cephalosporin use.     Physical Exam: Patient is alert and responsive to verbal discussion and commands.  Vitals  Blood pressure (!) 100/57, pulse (!) 114, temperature (!) 100.4 F (38 C), temperature source Oral, resp. rate 20, height 5\' 4"  (1.626 m), weight 76.5 kg (168 lb 10.4 oz), SpO2 (!) 87 %.  Lower Extremity exam:  Vascular: Severe venous stasis edema worse left versus right  Dermatological: Significant skin changes to both lower extremities.  She has some ulcerative changes to both legs secondary to venous stasis issues.  These are on her legs.  She has a small ulceration at the distal tip of the third toe of the left foot which I debrided today and cleaned up and dressed.  Does not appear to have a significant spreading infection just a wound to the tip of the toe.  Nails are extremely thickened and long and mycotic in nature I debrided those 1234 and 5 bilaterally using nail cutting forceps to thin and shortened nails.  Neurological: Appears to be intact with her lower extremity sensory capacity.  Ortho: Contracture of toes to the lesser toes bilaterally.  Data Review  CBC Recent Labs  Lab 03/26/18 2008 03/27/18 0316  WBC 18.6* 23.8*   HGB 10.5* 10.5*  HCT 31.3* 32.5*  PLT 302 322  MCV 84.7 85.6  MCH 28.4 27.6  MCHC 33.5 32.2  RDW 15.6* 15.1*  LYMPHSABS 0.6*  --   MONOABS 1.0*  --   EOSABS 0.0  --   BASOSABS 0.0  --    ------------------------------------------------------------------------------------------------------------------  Chemistries  Recent Labs  Lab 03/26/18 2104 03/27/18 0316 03/28/18 0855  NA 132* 136  --   K 5.1 4.4  --   CL 102 106  --  CO2 22 21*  --   GLUCOSE 467* 216*  --   BUN 83* 75*  --   CREATININE 2.52* 2.02* 1.99*  CALCIUM 8.8* 9.2  --    -----------------------------------------------------------------------------------Imaging results:   Dg Foot Complete Left  Result Date: 03/26/2018 CLINICAL DATA:  First and second toe diabetic wounds. EXAM: LEFT FOOT - COMPLETE 3+ VIEW COMPARISON:  None. FINDINGS: No acute fracture or dislocation. Pes planovalgus. Mild midfoot osteoarthritis. Osteopenia. Diffuse severe soft tissue swelling. IMPRESSION: 1. Diffuse, severe soft tissue swelling about the foot. No acute osseous abnormality. Electronically Signed   By: Titus Dubin M.D.   On: 03/26/2018 20:17    Assessment & Plan: I still think you have best course of action first to get the edema under better control and would recommend vascular to evaluate and treat that.  Dermatology can be helpful and recommendations for treating the chronic lichenification of the skin to the lower extremities.  Wound on the distal tip of the toe is fairly unremarkable and should heal up with appropriate pressure relief.  Nails debrided as mentioned above.  I will sign off on treatment for right now.  If further evaluation is needed please let me know.  Principal Problem:   Diabetic infection of left foot ([place]) Active Problems:   Chronic venous insufficiency   Type 2 diabetes mellitus with insulin therapy ([place])   Hyperlipidemia   Sepsis ([place])   AKI (acute kidney injury) ([place])   Pressure injury of  skin   Albertine Patricia M.D on 03/28/2018 at 10:31 AM  Thank you for the consult, we will follow the patient with you in the Hospital.

## 2018-03-28 NOTE — Clinical Social Work Note (Signed)
The CSW received a consult for possible neglect by ALF staff. CSW will assess when able. PT is pending, and the patient may be recommended for a higher level of care. CSW will assist with such.  Santiago Bumpers, MSW, Latanya Presser [phone]

## 2018-03-28 NOTE — Progress Notes (Signed)
Pharmacy Antibiotic Note  [name] is a 67 y.o. female admitted on 03/26/2018 with sepsis.  Pharmacy has been consulted for vancomycin and cefepime dosing.  Plan: DW 64kg  Vd 45L kei 0.023 hr-1  T1/2 30 hours Vancomycin 1 gram q 36 hours ordered with stacked dosing. Level before 4th dose. Goal trough 15-20.    Cefepime 2 grams q 24 hours ordered.  4/6:  Scr improved to 1.99. PK parameters still support current dosing.      Height: 5\' 4"  (162.6 cm) Weight: 168 lb 10.4 oz (76.5 kg) IBW/kg (Calculated) : 54.7  Temp (24hrs), Avg:100 F (37.8 C), Min:98.2 F (36.8 C), Max:101.8 F (38.8 C)  Recent Labs  Lab 03/26/18 2008 03/26/18 2104 03/27/18 0316 03/28/18 0855  WBC 18.6*  --  23.8*  --   CREATININE  --  2.52* 2.02* 1.99*  LATICACIDVEN 1.8  --   --   --     Estimated Creatinine Clearance: 27.8 mL/min (A) (by C-G formula based on SCr of 1.99 mg/dL (H)).    Allergies  Allergen Reactions  . Penicillins Swelling    Has patient had a PCN reaction causing immediate rash, facial/tongue/throat swelling, SOB or lightheadedness with hypotension: Unknown Has patient had a PCN reaction causing severe rash involving mucus membranes or skin necrosis: Unknown Has patient had a PCN reaction that required hospitalization: Unknown Has patient had a PCN reaction occurring within the last 10 years: Unknown If all of the above answers are "NO", then may proceed with Cephalosporin use.     Antimicrobials this admission: Vancomycin, cefepime 4/4  >>    >>   Dose adjustments this admission:   Microbiology results: 4/4 BCx: pending   Thank you for allowing pharmacy to be a part of this patient's care.  Ceili Boshers A 03/28/2018 12:54 PM

## 2018-03-28 NOTE — Progress Notes (Signed)
[place] at [place] NAME: [name]    MR#:  [id number]  DATE OF BIRTH:  04-26-1951  SUBJECTIVE:   Patient here due to worsening lower extremity edema and cellulitis.  Patient has chronic lymphedema with severe hyperkeratotic skin lesions on the lower extremities bilaterally.  Continue broad-spectrum IV antibiotics.  Patient does not have an appetite this morning and does not want to eat.  Denies any other complaints presently.   REVIEW OF SYSTEMS:    Review of Systems  Constitutional: Negative for chills and fever.  HENT: Negative for congestion and tinnitus.   Eyes: Negative for blurred vision and double vision.  Respiratory: Negative for cough, shortness of breath and wheezing.   Cardiovascular: Positive for leg swelling. Negative for chest pain, orthopnea and PND.  Gastrointestinal: Negative for abdominal pain, diarrhea, nausea and vomiting.  Genitourinary: Negative for dysuria and hematuria.  Neurological: Positive for weakness (Generalized weakness. ). Negative for dizziness, sensory change and focal weakness.  All other systems reviewed and are negative.   Nutrition: Heart healthy/Carb modified Tolerating Diet: Yes Tolerating PT: Await Eval.    DRUG ALLERGIES:   Allergies  Allergen Reactions  . Penicillins Swelling    Has patient had a PCN reaction causing immediate rash, facial/tongue/throat swelling, SOB or lightheadedness with hypotension: Unknown Has patient had a PCN reaction causing severe rash involving mucus membranes or skin necrosis: Unknown Has patient had a PCN reaction that required hospitalization: Unknown Has patient had a PCN reaction occurring within the last 10 years: Unknown If all of the above answers are "NO", then may proceed with Cephalosporin use.     VITALS:  Blood pressure (!) 100/57, pulse (!) 114, temperature (!) 100.4 F (38 C), temperature source Oral, resp. rate 20, height 5' 4"  (1.626 m),  weight 76.5 kg (168 lb 10.4 oz), SpO2 (!) 87 %.  PHYSICAL EXAMINATION:   Physical Exam  GENERAL:  67 y.o.-year-old patient lying in bed in no acute distress.  EYES: Pupils equal, round, reactive to light and accommodation. No scleral icterus. Extraocular muscles intact.  HEENT: Head atraumatic, normocephalic. Oropharynx and nasopharynx clear.  NECK:  Supple, no jugular venous distention. No thyroid enlargement, no tenderness.  LUNGS: Normal breath sounds bilaterally, no wheezing, rales, rhonchi. No use of accessory muscles of respiration.  CARDIOVASCULAR: S1, S2 normal. No murmurs, rubs, or gallops.  ABDOMEN: Soft, nontender, nondistended. Bowel sounds present. No organomegaly or mass.  EXTREMITIES: Chronic lymphedema bilaterally with significant lichenification and hyperkeratotic skin lesions throughout lower extremities.  Left lower extremity near the ankle somewhat red and inflamed NEUROLOGIC: Cranial nerves II through XII are intact. No focal Motor or sensory deficits b/l.   PSYCHIATRIC: The patient is alert and oriented x 3.  SKIN: Hyperkeratotic skin lesions bilaterally.  Possible left lower extremity cellulitis.  No Lesion, or ulcer.    LABORATORY PANEL:   CBC Recent Labs  Lab 03/27/18 0316  WBC 23.8*  HGB 10.5*  HCT 32.5*  PLT 322   ------------------------------------------------------------------------------------------------------------------  Chemistries  Recent Labs  Lab 03/27/18 0316 03/28/18 0855  NA 136  --   K 4.4  --   CL 106  --   CO2 21*  --   GLUCOSE 216*  --   BUN 75*  --   CREATININE 2.02* 1.99*  CALCIUM 9.2  --    ------------------------------------------------------------------------------------------------------------------  Cardiac Enzymes Recent Labs  Lab 03/26/18 2104  TROPONINI <0.03   ------------------------------------------------------------------------------------------------------------------  RADIOLOGY:  Dg Foot Complete  Left  Result Date: 03/26/2018 CLINICAL DATA:  First and second toe diabetic wounds. EXAM: LEFT FOOT - COMPLETE 3+ VIEW COMPARISON:  None. FINDINGS: No acute fracture or dislocation. Pes planovalgus. Mild midfoot osteoarthritis. Osteopenia. Diffuse severe soft tissue swelling. IMPRESSION: 1. Diffuse, severe soft tissue swelling about the foot. No acute osseous abnormality. Electronically Signed   By: Titus Dubin M.D.   On: 03/26/2018 20:17     ASSESSMENT AND PLAN:   67 year old female with past medical history of diabetes, hyperlipidemia, diabetes, hypothyroidism, chronic lymphedema who presented to the hospital due to worsening lower extremity swelling and pain.  1.  Sepsis- patient met criteria admission given her leukocytosis, tachycardia, lower extremity skin findings suggestive of cellulitis. -Continue broad-spectrum IV antibiotics with vancomycin, cefepime.  Follow cultures.  Currently afebrile.  2.  Chronic lymphedema/bilateral lower extremity cellulitis-continue supportive care with broad-spectrum IV antibiotics with IV vancomycin, cefepime for cellulitis.  Appreciate podiatry/vascular surgery input.  No acute surgical intervention. -We will place wound team consult for Unna boots. -Patient has significant hyperkeratotic areas in the lower extremities and will await dermatology input next week.  3.  Diabetes type 2 without complication-continue sliding scale insulin.    4.  Hypothyroidism-continue Synthroid.  5.  Hyperlipidemia-continue Pravachol.  6.  Adult failure to thrive-placed on nutritional supplements, will add some Marinol.  7. Acute Renal failure - due to poor PO intake dehydration.  - will start on gentle IV fluids and follow BUN/Cr.    Patient's prognosis is guarded to poor will consider getting a palliative care consult on Monday if patient is not improving.   All the records are reviewed and case discussed with Care Management/Social Worker. Management plans  discussed with the patient, family and they are in agreement.  CODE STATUS: Full code  DVT Prophylaxis: Hep SQ  TOTAL TIME TAKING CARE OF THIS PATIENT: 30 minutes.   POSSIBLE D/C IN 1-2 DAYS, DEPENDING ON CLINICAL CONDITION.   Henreitta Leber M.D on 03/28/2018 at 3:22 PM  Between 7am to 6pm - Pager - [phone]  After 6pm go to [email or web address] - password [email or web address] [place] [facility]  Office  [phone]  CC: Primary care physician; Marguerita Merles, MD

## 2018-03-29 LAB — BASIC METABOLIC PANEL
Anion gap: 5 (ref 5–15)
BUN: 64 mg/dL — AB (ref 6–20)
CO2: 22 mmol/L (ref 22–32)
CREATININE: 1.93 mg/dL — AB (ref 0.44–1.00)
Calcium: 8.8 mg/dL — ABNORMAL LOW (ref 8.9–10.3)
Chloride: 114 mmol/L — ABNORMAL HIGH (ref 101–111)
GFR calc Af Amer: 30 mL/min — ABNORMAL LOW (ref >60)
GFR, EST NON AFRICAN AMERICAN: 26 mL/min — AB (ref >60)
Glucose, Bld: 189 mg/dL — ABNORMAL HIGH (ref 65–99)
POTASSIUM: 4.4 mmol/L (ref 3.5–5.1)
SODIUM: 141 mmol/L (ref 135–145)

## 2018-03-29 LAB — CBC
HCT: 30.2 % — ABNORMAL LOW (ref 35.0–47.0)
Hemoglobin: 10 g/dL — ABNORMAL LOW (ref 12.0–16.0)
MCH: 28 pg (ref 26.0–34.0)
MCHC: 33.1 g/dL (ref 32.0–36.0)
MCV: 84.6 fL (ref 80.0–100.0)
Platelets: 315 10*3/uL (ref 150–440)
RBC: 3.57 MIL/uL — AB (ref 3.80–5.20)
RDW: 15.3 % — ABNORMAL HIGH (ref 11.5–14.5)
WBC: 19.2 10*3/uL — AB (ref 3.6–11.0)

## 2018-03-29 LAB — GLUCOSE, CAPILLARY
GLUCOSE-CAPILLARY: 320 mg/dL — AB (ref 65–99)
Glucose-Capillary: 187 mg/dL — ABNORMAL HIGH (ref 65–99)
Glucose-Capillary: 172 mg/dL — ABNORMAL HIGH (ref 65–99)
Glucose-Capillary: 322 mg/dL — ABNORMAL HIGH (ref 65–99)

## 2018-03-29 NOTE — Plan of Care (Signed)
  Problem: Skin Integrity: Goal: Risk for impaired skin integrity will decrease Outcome: Not Progressing

## 2018-03-29 NOTE — Progress Notes (Addendum)
[place] at [place] NAME: [name]    MR#:  [id number]  DATE OF BIRTH:  1951-10-08  SUBJECTIVE:   No acute events overnight.  Afebrile.  White cell count is trending down.  Patient still continues to have poor p.o. intake.  REVIEW OF SYSTEMS:    Review of Systems  Constitutional: Negative for chills and fever.  HENT: Negative for congestion and tinnitus.   Eyes: Negative for blurred vision and double vision.  Respiratory: Negative for cough, shortness of breath and wheezing.   Cardiovascular: Positive for leg swelling. Negative for chest pain, orthopnea and PND.  Gastrointestinal: Negative for abdominal pain, diarrhea, nausea and vomiting.  Genitourinary: Negative for dysuria and hematuria.  Neurological: Positive for weakness (Generalized weakness. ). Negative for dizziness, sensory change and focal weakness.  All other systems reviewed and are negative.   Nutrition: Heart healthy/Carb modified Tolerating Diet: Yes Tolerating PT: Await Eval.    DRUG ALLERGIES:   Allergies  Allergen Reactions  . Penicillins Swelling    Has patient had a PCN reaction causing immediate rash, facial/tongue/throat swelling, SOB or lightheadedness with hypotension: Unknown Has patient had a PCN reaction causing severe rash involving mucus membranes or skin necrosis: Unknown Has patient had a PCN reaction that required hospitalization: Unknown Has patient had a PCN reaction occurring within the last 10 years: Unknown If all of the above answers are "NO", then may proceed with Cephalosporin use.     VITALS:  Blood pressure (!) 90/59, pulse 94, temperature 98.5 F (36.9 C), temperature source Oral, resp. rate 19, height 5' 4"  (1.626 m), weight 76.5 kg (168 lb 10.4 oz), SpO2 99 %.  PHYSICAL EXAMINATION:   Physical Exam  GENERAL:  67 y.o.-year-old patient lying in bed in no acute distress.  EYES: Pupils equal, round, reactive to light and  accommodation. No scleral icterus. Extraocular muscles intact.  HEENT: Head atraumatic, normocephalic. Oropharynx and nasopharynx clear.  NECK:  Supple, no jugular venous distention. No thyroid enlargement, no tenderness.  LUNGS: Normal breath sounds bilaterally, no wheezing, rales, rhonchi. No use of accessory muscles of respiration.  CARDIOVASCULAR: S1, S2 normal. No murmurs, rubs, or gallops.  ABDOMEN: Soft, nontender, nondistended. Bowel sounds present. No organomegaly or mass.  EXTREMITIES: Chronic lymphedema bilaterally with significant lichenification and hyperkeratotic skin lesions throughout lower extremities.  Left lower extremity near the ankle somewhat red and inflamed NEUROLOGIC: Cranial nerves II through XII are intact. No focal Motor or sensory deficits b/l.   PSYCHIATRIC: The patient is alert and oriented x 3.  SKIN: Hyperkeratotic skin lesions bilaterally.  Possible left lower extremity cellulitis.  No Lesion, or ulcer.    LABORATORY PANEL:   CBC Recent Labs  Lab 03/29/18 0416  WBC 19.2*  HGB 10.0*  HCT 30.2*  PLT 315   ------------------------------------------------------------------------------------------------------------------  Chemistries  Recent Labs  Lab 03/29/18 0416  NA 141  K 4.4  CL 114*  CO2 22  GLUCOSE 189*  BUN 64*  CREATININE 1.93*  CALCIUM 8.8*   ------------------------------------------------------------------------------------------------------------------  Cardiac Enzymes Recent Labs  Lab 03/26/18 2104  TROPONINI <0.03   ------------------------------------------------------------------------------------------------------------------  RADIOLOGY:  No results found.   ASSESSMENT AND PLAN:   67 year old female with past medical history of diabetes, hyperlipidemia, diabetes, hypothyroidism, chronic lymphedema who presented to the hospital due to worsening lower extremity swelling and pain.  1.  Sepsis- patient met criteria  admission given her leukocytosis, tachycardia, lower extremity skin findings suggestive of cellulitis. -Continue broad-spectrum IV antibiotics  with vancomycin, cefepime.  -Cultures so far negative, patient remains afebrile.  2.  Chronic lymphedema/bilateral lower extremity cellulitis-continue supportive care with broad-spectrum IV antibiotics with IV vancomycin, cefepime for cellulitis.  Appreciate podiatry/vascular surgery input.  No acute surgical intervention. -Wait wound team consult for possible Unna boots placement. -Patient has significant hyperkeratotic areas in the lower extremities and will await dermatology input next week.  3.  Diabetes type 2 without complication-continue sliding scale insulin.  BS stable.   4.  Hypothyroidism-continue Synthroid.  5.  Hyperlipidemia-continue Pravachol.  6.  Adult failure to thrive-placed on nutritional supplements, cont. Marinol.   7. Acute Renal failure - due to poor PO intake dehydration.  - cont. Gentle IV fluids and follow BUN/Cr.  Cr slightly improved.   Patient's prognosis is guarded to poor will consider getting a palliative care consult on Monday if patient is not improving.   All the records are reviewed and case discussed with Care Management/Social Worker. Management plans discussed with the patient, family and they are in agreement.  CODE STATUS: Full code  DVT Prophylaxis: Hep SQ  TOTAL TIME TAKING CARE OF THIS PATIENT: 30 minutes.   POSSIBLE D/C IN 1-2 DAYS, DEPENDING ON CLINICAL CONDITION.   Henreitta Leber M.D on 03/29/2018 at 3:42 PM  Between 7am to 6pm - Pager - [phone]  After 6pm go to [email or web address] - password [email or web address] [place] [facility]  Office  [phone]  CC: Primary care physician; Marguerita Merles, MD

## 2018-03-29 NOTE — Plan of Care (Signed)
  Problem: Activity: Goal: Risk for activity intolerance will decrease Outcome: Not Progressing Note:  Patient has not ambulated this shift. Unsure if patient ever ambulates. Will encourage ambulation, with assistance, if able. Wenda Low [facility]

## 2018-03-30 ENCOUNTER — Encounter: Payer: Self-pay | Admitting: *Deleted

## 2018-03-30 ENCOUNTER — Inpatient Hospital Stay: Payer: Medicare HMO

## 2018-03-30 LAB — BASIC METABOLIC PANEL
ANION GAP: 6 (ref 5–15)
BUN: 73 mg/dL — ABNORMAL HIGH (ref 6–20)
CHLORIDE: 108 mmol/L (ref 101–111)
CO2: 21 mmol/L — AB (ref 22–32)
Calcium: 8.6 mg/dL — ABNORMAL LOW (ref 8.9–10.3)
Creatinine, Ser: 3.66 mg/dL — ABNORMAL HIGH (ref 0.44–1.00)
GFR calc Af Amer: 14 mL/min — ABNORMAL LOW (ref >60)
GFR calc non Af Amer: 12 mL/min — ABNORMAL LOW (ref >60)
Glucose, Bld: 195 mg/dL — ABNORMAL HIGH (ref 65–99)
POTASSIUM: 4.7 mmol/L (ref 3.5–5.1)
Sodium: 135 mmol/L (ref 135–145)

## 2018-03-30 LAB — CBC
HEMATOCRIT: 29.9 % — AB (ref 35.0–47.0)
HEMOGLOBIN: 10 g/dL — AB (ref 12.0–16.0)
MCH: 28.2 pg (ref 26.0–34.0)
MCHC: 33.4 g/dL (ref 32.0–36.0)
MCV: 84.6 fL (ref 80.0–100.0)
Platelets: 315 10*3/uL (ref 150–440)
RBC: 3.53 MIL/uL — AB (ref 3.80–5.20)
RDW: 15.6 % — AB (ref 11.5–14.5)
WBC: 13.9 10*3/uL — AB (ref 3.6–11.0)

## 2018-03-30 LAB — GLUCOSE, CAPILLARY
Glucose-Capillary: 193 mg/dL — ABNORMAL HIGH (ref 65–99)
Glucose-Capillary: 173 mg/dL — ABNORMAL HIGH (ref 65–99)
Glucose-Capillary: 218 mg/dL — ABNORMAL HIGH (ref 65–99)
Glucose-Capillary: 200 mg/dL — ABNORMAL HIGH (ref 65–99)

## 2018-03-30 LAB — VANCOMYCIN, TROUGH: Vancomycin Tr: 27 ug/mL (ref 15–20)

## 2018-03-30 NOTE — Plan of Care (Signed)
  Problem: Skin Integrity: Goal: Risk for impaired skin integrity will decrease Outcome: Progressing  Podiatry, Vascular,Wound involved

## 2018-03-30 NOTE — Progress Notes (Signed)
[facility] [place] NAME: [name]    MR#:  [id number]  DATE OF BIRTH:  14-Aug-1951  SUBJECTIVE:   Patient overall feels better.  Patient's lower extremities are wrapped in Unna boots.  White cell count has come down.  Afebrile.  Cultures remain negative.  Creatinine has increased since yesterday.  REVIEW OF SYSTEMS:    Review of Systems  Constitutional: Negative for chills and fever.  HENT: Negative for congestion and tinnitus.   Eyes: Negative for blurred vision and double vision.  Respiratory: Negative for cough, shortness of breath and wheezing.   Cardiovascular: Positive for leg swelling. Negative for chest pain, orthopnea and PND.  Gastrointestinal: Negative for abdominal pain, diarrhea, nausea and vomiting.  Genitourinary: Negative for dysuria and hematuria.  Neurological: Positive for weakness (Generalized weakness. ). Negative for dizziness, sensory change and focal weakness.  All other systems reviewed and are negative.   Nutrition: Heart healthy/Carb modified Tolerating Diet: Yes Tolerating PT: Await Eval.    DRUG ALLERGIES:   Allergies  Allergen Reactions  . Penicillins Swelling    Has patient had a PCN reaction causing immediate rash, facial/tongue/throat swelling, SOB or lightheadedness with hypotension: Unknown Has patient had a PCN reaction causing severe rash involving mucus membranes or skin necrosis: Unknown Has patient had a PCN reaction that required hospitalization: Unknown Has patient had a PCN reaction occurring within the last 10 years: Unknown If all of the above answers are "NO", then may proceed with Cephalosporin use.     VITALS:  Blood pressure 100/67, pulse 90, temperature 98.2 F (36.8 C), temperature source Oral, resp. rate 17, height 5' 4"  (1.626 m), weight 76.5 kg (168 lb 10.4 oz), SpO2 96 %.  PHYSICAL EXAMINATION:   Physical Exam  GENERAL:  67 y.o.-year-old patient lying in bed in no  acute distress.  EYES: Pupils equal, round, reactive to light and accommodation. No scleral icterus. Extraocular muscles intact.  HEENT: Head atraumatic, normocephalic. Oropharynx and nasopharynx clear.  NECK:  Supple, no jugular venous distention. No thyroid enlargement, no tenderness.  LUNGS: Normal breath sounds bilaterally, no wheezing, rales, rhonchi. No use of accessory muscles of respiration.  CARDIOVASCULAR: S1, S2 normal. No murmurs, rubs, or gallops.  ABDOMEN: Soft, nontender, nondistended. Bowel sounds present. No organomegaly or mass.  EXTREMITIES: Chronic lymphedema bilaterally with significant lichenification and hyperkeratotic skin lesions throughout lower extremities.  Left lower extremity near the ankle somewhat red and inflamed NEUROLOGIC: Cranial nerves II through XII are intact. No focal Motor or sensory deficits b/l.   PSYCHIATRIC: The patient is alert and oriented x 3.  SKIN: Hyperkeratotic skin lesions bilaterally.  Possible left lower extremity cellulitis.  No Lesion, or ulcer.    LABORATORY PANEL:   CBC Recent Labs  Lab 03/30/18 1056  WBC 13.9*  HGB 10.0*  HCT 29.9*  PLT 315   ------------------------------------------------------------------------------------------------------------------  Chemistries  Recent Labs  Lab 03/30/18 1056  NA 135  K 4.7  CL 108  CO2 21*  GLUCOSE 195*  BUN 73*  CREATININE 3.66*  CALCIUM 8.6*   ------------------------------------------------------------------------------------------------------------------  Cardiac Enzymes Recent Labs  Lab 03/26/18 2104  TROPONINI <0.03   ------------------------------------------------------------------------------------------------------------------  RADIOLOGY:  No results found.   ASSESSMENT AND PLAN:   67 year old female with past medical history of diabetes, hyperlipidemia, diabetes, hypothyroidism, chronic lymphedema who presented to the hospital due to worsening lower  extremity swelling and pain.  1.  Sepsis- patient met criteria on admission given her leukocytosis, tachycardia, lower  extremity skin findings suggestive of cellulitis. -Continue IV vancomycin, DC cefepime for now.  Cultures are negative.  Patient remains afebrile.  2.  Chronic lymphedema/bilateral lower extremity cellulitis- to do IV vancomycin, appreciate podiatry/vascular surgery input.  No acute surgical intervention. -Seen by the wound team and status post Unna boot placement.  Unna boots need to be replaced weekly. -Patient has significant hyperkeratotic areas in the lower extremities and will refer to [facility] as outpatient.   3.  Diabetes type 2 without complication-continue sliding scale insulin.  BS stable.   4.  Hypothyroidism-continue Synthroid.  5.  Hyperlipidemia-continue Pravachol.  6.  Adult failure to thrive-placed on nutritional supplements, cont. Marinol.  - encourage PO intake.   7. Acute Renal failure - due to poor PO intake dehydration.  -Patient's creatinines has increased from 1.9-3.6 today.  Await vancomycin trough.  We will increase IV fluids from 50 cc 200 cc an hour, follow BUN and creatinine.  I will get a renal ultrasound.  Consider Palliative Care consult for Goals of care.    All the records are reviewed and case discussed with Care Management/Social Worker. Management plans discussed with the patient, family and they are in agreement.  CODE STATUS: Full code  DVT Prophylaxis: Hep SQ  TOTAL TIME TAKING CARE OF THIS PATIENT: 30 minutes.   POSSIBLE D/C IN 1-2 DAYS, DEPENDING ON CLINICAL CONDITION.   Henreitta Leber M.D on 03/30/2018 at 3:27 PM  Between 7am to 6pm - Pager - [phone]  After 6pm go to [email or web address] - password [email or web address] [facility]  Office  [phone]  CC: Primary care physician; Marguerita Merles, MD

## 2018-03-30 NOTE — Consult Note (Signed)
[place] Nurse wound consult note Reason for Consult: Application of bilateral Unna's Boots. Vascular and Podiatry have seen over weekend and request was to have [place] place [employer] with once weekly changes.. Wound type:Venous insufficiency and lymphedema (L>R) Pressure Injury POA: NA Measurement: N/A Wound bed: N/A Drainage (amount, consistency, odor) N/A Periwound: Lichenification on both LEs, L>R Dressing procedure/placement/frequency: LEs are washed and dried, Unna's boot placed bilaterally and topped with conform bandage, then 4-inch Coban. Patient will be provided with Prevalon Boots for elevation of heels off of the bed surface (she does not ambulate and is in chair or bed both here and in her SNF.  Recommendation from Vascular was to have patient follow up at the wound care center or her choosing for once weekly Unna's Boot changes.  If you agree, please order/arrange. If patient is still in house next Monday, 4/15, [place] [facility] will change.  [place] nursing team will not follow, but will remain available to this patient, the nursing and medical teams.  Please re-consult if needed. Thanks, Maudie Flakes, MSN, RN, [place], Arther Abbott  Pager# [phone]

## 2018-03-30 NOTE — Care Management (Signed)
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 It had been reported she was "from a group home."  Physical therapy  has recommended SNF. CSW is aware.  Unna boots have been placed and on IV antibiotics.

## 2018-03-30 NOTE — Evaluation (Signed)
Physical Therapy Evaluation Patient Details Name: [name] MRN: [id number] DOB: [birth year] Today's Date: 03/30/2018   History of Present Illness  presented to ER secondary to worsening LE edema, wounds; admitted with sepsis related to diabetic foot ulceration/infection.  Clinical Impression  Upon evaluation, patient alert and oriented; follows commands.  Eager for OOB attempts, but generally limited by weakness, deconditioning (L >R) and dizziness (8/10) with transition to upright.  Currently requiring mod assist from therapist for bed mobility, sit/stand and static standing balance with RW.  Unsafe/unable to attempt gait/OOB at this time.  Will continue to progress as medically appropriate and able to tolerate. Would benefit from skilled PT to address above deficits and promote optimal return to PLOF; recommend transition to STR upon discharge from acute hospitalization.     Follow Up Recommendations SNF    Equipment Recommendations  Rolling walker with 5" wheels    Recommendations for Other Services       Precautions / Restrictions Precautions Precautions: Fall Restrictions Weight Bearing Restrictions: No      Mobility  Bed Mobility Overal bed mobility: Needs Assistance Bed Mobility: Supine to Sit;Sit to Supine     Supine to sit: Min assist;Mod assist Sit to supine: Mod assist   General bed mobility comments: assist for LE management; very slow movement transition requiring multiple attempts due to reported dizziness  Transfers Overall transfer level: Needs assistance Equipment used: Rolling walker (2 wheeled) Transfers: Sit to/from Stand Sit to Stand: Mod assist         General transfer comment: extensive assist for lift off, anterior weight translation; unable to tolerate greater than 3-4 seconds due to fatigue, weakness, dizziness (unable to stand for full standing BP assessment)  Ambulation/Gait             General Gait Details:  unsafe/unable  Stairs            Wheelchair Mobility    Modified Rankin (Stroke Patients Only)       Balance Overall balance assessment: Needs assistance Sitting-balance support: No upper extremity supported;Feet supported Sitting balance-Leahy Scale: Fair     Standing balance support: Bilateral upper extremity supported Standing balance-Leahy Scale: Poor                               Pertinent Vitals/Pain Pain Assessment: 0-10 Pain Score: 4  Pain Location: bilat LEs, L > R Pain Descriptors / Indicators: Aching;Grimacing Pain Intervention(s): Monitored during session;Limited activity within patient's tolerance;Repositioned    Home Living Family/patient expects to be discharged to:: Group home                 Additional Comments: patient is employed and lives in group home environment; single-story with 1 step to enter    Prior Function Level of Independence: Independent with assistive device(s)         Comments: Mod indep with SPC for ADLs, household mobilization; recently using RW due to progressive weakness/pain bilat LEs.  Denies fall history.     Hand Dominance   Dominant Hand: Right    Extremity/Trunk Assessment   Upper Extremity Assessment Upper Extremity Assessment: Overall WFL for tasks assessed    Lower Extremity Assessment Lower Extremity Assessment: Generalized weakness(R LE grossly 4-/5, L LE 3/5; bilat unna boots in place)       Communication   Communication: No difficulties  Cognition Arousal/Alertness: Awake/alert Behavior During Therapy: WFL for tasks assessed/performed Overall Cognitive Status: Within  Functional Limits for tasks assessed                                        General Comments      Exercises Other Exercises Other Exercises: Rolling bilat, sup/min assist for full rotation   Assessment/Plan    PT Assessment Patient needs continued PT services  PT Problem List Decreased  strength;Decreased range of motion;Decreased activity tolerance;Decreased balance;Decreased mobility;Decreased coordination;Decreased cognition;Decreased knowledge of use of DME;Decreased safety awareness;Decreased knowledge of precautions       PT Treatment Interventions DME instruction;Gait training;Stair training;Functional mobility training;Therapeutic activities;Therapeutic exercise;Balance training;Cognitive remediation    PT Goals (Current goals can be found in the Care Plan section)  Acute Rehab PT Goals Patient Stated Goal: to try to get up PT Goal Formulation: With patient Time For Goal Achievement: 04/13/18 Potential to Achieve Goals: Good    Frequency Min 2X/week   Barriers to discharge Decreased caregiver support      Co-evaluation               AM-PAC PT "6 Clicks" Daily Activity  Outcome Measure Difficulty turning over in bed (including adjusting bedclothes, sheets and blankets)?: Unable Difficulty moving from lying on back to sitting on the side of the bed? : Unable Difficulty sitting down on and standing up from a chair with arms (e.g., wheelchair, bedside commode, etc,.)?: Unable Help needed moving to and from a bed to chair (including a wheelchair)?: A Lot Help needed walking in hospital room?: A Lot Help needed climbing 3-5 steps with a railing? : Total 6 Click Score: 8    End of Session Equipment Utilized During Treatment: Gait belt Activity Tolerance: Patient limited by fatigue Patient left: in bed;with call bell/phone within reach;with bed alarm set Nurse Communication: Mobility status PT Visit Diagnosis: Muscle weakness (generalized) (M62.81);Difficulty in walking, not elsewhere classified (R26.2);Unsteadiness on feet (R26.81)    Time: 0762-2633 PT Time Calculation (min) (ACUTE ONLY): 32 min   Charges:   PT Evaluation $PT Eval Moderate Complexity: 1 Mod PT Treatments $Therapeutic Activity: 8-22 mins   PT G Codes:        Saquan Furtick H.  [place]ark, PT, DPT, NCS 03/30/18, 5:18 PM [phone]

## 2018-03-31 LAB — GLUCOSE, CAPILLARY
GLUCOSE-CAPILLARY: 248 mg/dL — AB (ref 65–99)
Glucose-Capillary: 145 mg/dL — ABNORMAL HIGH (ref 65–99)
Glucose-Capillary: 196 mg/dL — ABNORMAL HIGH (ref 65–99)
Glucose-Capillary: 235 mg/dL — ABNORMAL HIGH (ref 65–99)

## 2018-03-31 LAB — BASIC METABOLIC PANEL
Anion gap: 5 (ref 5–15)
BUN: 74 mg/dL — ABNORMAL HIGH (ref 6–20)
CALCIUM: 8.3 mg/dL — AB (ref 8.9–10.3)
CO2: 20 mmol/L — AB (ref 22–32)
CREATININE: 4.39 mg/dL — AB (ref 0.44–1.00)
Chloride: 109 mmol/L (ref 101–111)
GFR, EST AFRICAN AMERICAN: 11 mL/min — AB (ref >60)
GFR, EST NON AFRICAN AMERICAN: 10 mL/min — AB (ref >60)
Glucose, Bld: 161 mg/dL — ABNORMAL HIGH (ref 65–99)
Potassium: 5 mmol/L (ref 3.5–5.1)
Sodium: 134 mmol/L — ABNORMAL LOW (ref 135–145)

## 2018-03-31 LAB — CULTURE, BLOOD (ROUTINE X 2)
CULTURE: NO GROWTH
Culture: NO GROWTH
Special Requests: ADEQUATE

## 2018-03-31 LAB — VANCOMYCIN, TROUGH: Vancomycin Tr: 21 ug/mL (ref 15–20)

## 2018-03-31 MED ORDER — OCUVITE-LUTEIN PO CAPS
1.0000 | ORAL_CAPSULE | Freq: Every day | ORAL | Status: DC
  Administered 2018-03-31 – 2018-04-03 (×4): 1 via ORAL
  Filled 2018-03-31 (×4): qty 1

## 2018-03-31 MED ORDER — DOXYCYCLINE HYCLATE 100 MG PO TABS
100.0000 mg | ORAL_TABLET | Freq: Two times a day (BID) | ORAL | Status: DC
  Administered 2018-03-31 – 2018-04-03 (×7): 100 mg via ORAL
  Filled 2018-03-31 (×7): qty 1

## 2018-03-31 MED ORDER — PREMIER PROTEIN SHAKE
11.0000 [oz_av] | Freq: Two times a day (BID) | ORAL | Status: DC
  Administered 2018-03-31 – 2018-04-02 (×3): 11 [oz_av] via ORAL

## 2018-03-31 NOTE — Progress Notes (Signed)
Initial Nutrition Assessment  DOCUMENTATION CODES:   Not applicable  INTERVENTION:   Premier Protein BID, each supplement provides 160 kcal and 30 grams of protein.   Ocuvite daily for wound healing (provides zinc, vitamin A, vitamin C, Vitamin E, copper, and selenium)  Liberalize diet   Bowel regimen per MD  NUTRITION DIAGNOSIS:   Increased nutrient needs related to wound healing as evidenced by increased estimated needs from protein.  GOAL:   Patient will meet greater than or equal to 90% of their needs  MONITOR:   PO intake, Supplement acceptance, Labs, Weight trends, Skin, I & O's  REASON FOR ASSESSMENT:   Diagnosis    ASSESSMENT:   67 year old female with past medical history of diabetes, hyperlipidemia, diabetes, hypothyroidism, chronic lymphedema who presented to the hospital due to worsening lower extremity swelling and pain.   Met with pt in room today. Pt reports good appetite and oral intake pta. Pt currently eating 100% of meals. Pt reports that she does not like supplements; pt is willing to try vanilla Premier Protein. Per chart, pt with 30lb weight loss over the past few years; unsure how recent wt loss occurred. RD suspects wt loss is r/t uncontrolled diabetes. RD will order supplements and ocuvite to encourage wound healing. Pt educated today regarding the importance of protein need for wound healing and to preserve lean muscle.   Medications reviewed and include: aspirin, doxycycline, marinol, heparin, insulin, synthroid, NaCl @100ml /hr  Labs reviewed: Na 134(L), K 5.0 wnl, BUN 74(H), creat 4.39(H), Ca 8.3(L) Wbc- 13.9(H), Hgb 10.0(L), Hct 29.9(L)- 4/8 cbgs- 189, 195, 161 x 48hrs   NUTRITION - FOCUSED PHYSICAL EXAM:    Most Recent Value  Orbital Region  No depletion  Upper Arm Region  No depletion  Thoracic and Lumbar Region  No depletion  Buccal Region  No depletion  Temple Region  No depletion  Clavicle Bone Region  No depletion  Clavicle and  Acromion Bone Region  No depletion  Scapular Bone Region  No depletion  Dorsal Hand  No depletion  Patellar Region  Moderate depletion [right leg]  Anterior Thigh Region  Unable to assess [edema]  Posterior Calf Region  Unable to assess [edema]  Edema (RD Assessment)  Mild  Hair  Reviewed  Eyes  Reviewed  Mouth  Reviewed  Skin  Reviewed  Nails  Reviewed     Diet Order:  Diet heart healthy/carb modified Room service appropriate? Yes; Fluid consistency: Thin  EDUCATION NEEDS:   Education needs have been addressed  Skin:  Skin Assessment: (stage II buttocks, lymphaedema BLE)  Last BM:  4/4  Height:   Ht Readings from Last 1 Encounters:  03/27/18 5' 4"  (1.626 m)    Weight:   Wt Readings from Last 1 Encounters:  03/31/18 170 lb 8 oz (77.3 kg)    Ideal Body Weight:  54.5 kg  BMI:  Body mass index is 29.27 kg/m.  Estimated Nutritional Needs:   Kcal:  1500-1800kcal/day   Protein:  77-93g/day   Fluid:  >1.4L/day   Koleen Distance MS, RD, LDN Pager #[phone] After Hours Pager: [phone]

## 2018-03-31 NOTE — NC FL2 (Signed)
[place] LEVEL OF CARE SCREENING TOOL     IDENTIFICATION  Patient Name: [name] Birthdate: [birth year] Sex: female Admission Date (Current Location): 03/26/2018  [place] and [place] Number:  Engineering geologist and Address:  [facility], [address], [place], [place] [address]      Provider Number: [id number]  Attending Physician Name and Address:  Henreitta Leber, MD  Relative Name and Phone Number:  Maeola Harman [phone]     Current Level of Care: Hospital Recommended Level of Care: [place] Prior Approval Number:    Date Approved/Denied:   PASRR Number: [id number]  Discharge Plan: SNF    Current Diagnoses: Patient Active Problem List   Diagnosis Date Noted  . Pressure injury of skin 03/27/2018  . Sepsis ([place]) 03/26/2018  . Diabetic infection of left foot ([place]) 03/26/2018  . AKI (acute kidney injury) ([place]) 03/26/2018  . Venous ulcer of left leg ([place]) 11/18/2016  . Chronic venous insufficiency 11/18/2016  . Lymphedema 11/18/2016  . Swelling of limb 11/18/2016  . Type 2 diabetes mellitus with insulin therapy ([place]) 11/18/2016  . Hyperlipidemia 11/18/2016    Orientation RESPIRATION BLADDER Height & Weight     Self, Time, Situation, Place  Normal Incontinent Weight: 168 lb 10.4 oz (76.5 kg) Height:  5\' 4"  (162.6 cm)  BEHAVIORAL SYMPTOMS/MOOD NEUROLOGICAL BOWEL NUTRITION STATUS      Continent Diet(Cardiac diet)  AMBULATORY STATUS COMMUNICATION OF NEEDS Skin   Limited Assist Verbally PU Stage and Appropriate Care   PU Stage 2 Dressing: (PRN dressing change.)                   Personal Care Assistance Level of Assistance  Bathing, Feeding, Dressing Bathing Assistance: Limited assistance Feeding assistance: Independent Dressing Assistance: Limited assistance     Functional Limitations Info  Speech, Hearing, Sight Sight Info: Adequate Hearing Info: Adequate Speech Info: Adequate     SPECIAL CARE FACTORS FREQUENCY  PT (By licensed PT)     PT Frequency: 5x a week              Contractures Contractures Info: Not present    Additional Factors Info  Code Status, Allergies, Insulin Sliding Scale Code Status Info: Full code Allergies Info: PENICILLINS    Insulin Sliding Scale Info: insulin aspart (novoLOG) injection 0-9 Units 3x a day with meals.       Current Medications (03/31/2018):  This is the current hospital active medication list Current Facility-Administered Medications  Medication Dose Route Frequency Provider Last Rate Last Dose  . 0.9 %  sodium chloride infusion   Intravenous Continuous Henreitta Leber, MD 100 mL/hr at 03/31/18 0235    . acetaminophen (TYLENOL) tablet 650 mg  650 mg Oral Q6H PRN Lance Coon, MD   650 mg at 03/28/18 0500   Or  . acetaminophen (TYLENOL) suppository 650 mg  650 mg Rectal Q6H PRN Lance Coon, MD      . aspirin chewable tablet 81 mg  81 mg Oral Daily Lance Coon, MD   81 mg at 03/31/18 0820  . dronabinol (MARINOL) capsule 2.5 mg  2.5 mg Oral BID AC Henreitta Leber, MD   2.5 mg at 03/31/18 1125  . feeding supplement (ENSURE ENLIVE) (ENSURE ENLIVE) liquid 237 mL  237 mL Oral BID BM Henreitta Leber, MD   237 mL at 03/30/18 1556  . heparin injection 5,000 Units  5,000 Units Subcutaneous Q8H Lance Coon, MD  5,000 Units at 03/31/18 0620  . insulin aspart (novoLOG) injection 0-5 Units  0-5 Units Subcutaneous QHS Henreitta Leber, MD   4 Units at 03/29/18 2127  . insulin aspart (novoLOG) injection 0-9 Units  0-9 Units Subcutaneous TID WC Henreitta Leber, MD   2 Units at 03/31/18 1125  . levothyroxine (SYNTHROID, LEVOTHROID) tablet 125 mcg  125 mcg Oral QAC breakfast Lance Coon, MD   125 mcg at 03/31/18 0820  . MEDLINE mouth rinse  15 mL Mouth Rinse BID Lance Coon, MD   15 mL at 03/31/18 0820  . ondansetron (ZOFRAN) tablet 4 mg  4 mg Oral Q6H PRN Lance Coon, MD       Or  . ondansetron [facility]) injection 4  mg  4 mg Intravenous Q6H PRN Lance Coon, MD   4 mg at 03/27/18 0835  . oxyCODONE (Oxy IR/ROXICODONE) immediate release tablet 5 mg  5 mg Oral Q4H PRN Lance Coon, MD      . pravastatin (PRAVACHOL) tablet 20 mg  20 mg Oral Daily Lance Coon, MD   20 mg at 03/31/18 0820     Discharge Medications: Please see discharge summary for a list of discharge medications.  Relevant Imaging Results:  Relevant Lab Results:   Additional Information SSN [id number]  Ross Ludwig, [place]

## 2018-03-31 NOTE — Progress Notes (Signed)
Physical Therapy Treatment Patient Details Name: [name] MRN: [id number] DOB: July 17, 1951 Today's Date: 03/31/2018    History of Present Illness presented to ER secondary to worsening LE edema, wounds; admitted with sepsis related to diabetic foot ulceration/infection.    PT Comments    Pt reported feeling better.  BP taken in supine. 111/68. To edge of bed with min a to bring shoulders up off bed fully despite HOB being raised.  Participated in exercises as described below.  She reported some dizziness upon sitting 116/80.  After sitting for exercises she stated dizziness was less and wanted to try standing.  Stood with min a x 2 at edge of bed.  Was unable to lift feet and sat after 2 minutes.  On second attempt, she was able to march with dec step height and transfer to recliner at bedside with min a +2 for safety.  Overall tolerated well and remained in recliner after session.   Follow Up Recommendations  SNF     Equipment Recommendations  Rolling walker with 5" wheels    Recommendations for Other Services       Precautions / Restrictions Precautions Precautions: Fall Restrictions Weight Bearing Restrictions: No    Mobility  Bed Mobility Overal bed mobility: Needs Assistance Bed Mobility: Supine to Sit     Supine to sit: Min guard;Min assist     General bed mobility comments: to bring trunk up and tactile cues  Transfers Overall transfer level: Needs assistance Equipment used: Rolling walker (2 wheeled) Transfers: Sit to/from Stand Sit to Stand: Min assist;+2 physical assistance         General transfer comment: able to stand x 2 and eventually transfer to recliner at bedside.  Ambulation/Gait Ambulation/Gait assistance: Min assist;+2 physical assistance Ambulation Distance (Feet): 3 Feet Assistive device: Rolling walker (2 wheeled) Gait Pattern/deviations: Step-to pattern;Trunk flexed   Gait velocity interpretation: <1.8 ft/sec, indicative of risk  for recurrent falls     Stairs            Wheelchair Mobility    Modified Rankin (Stroke Patients Only)       Balance Overall balance assessment: Needs assistance Sitting-balance support: Feet supported;Single extremity supported Sitting balance-Leahy Scale: Fair   Postural control: Right lateral lean Standing balance support: Bilateral upper extremity supported Standing balance-Leahy Scale: Poor                              Cognition Arousal/Alertness: Awake/alert Behavior During Therapy: WFL for tasks assessed/performed Overall Cognitive Status: Within Functional Limits for tasks assessed                                        Exercises Other Exercises Other Exercises: seated ankle pumps, LAQ, marches x 10 with post lean and fatigued easily    General Comments        Pertinent Vitals/Pain Pain Assessment: No/denies pain    Home Living                      Prior Function            PT Goals (current goals can now be found in the care plan section) Progress towards PT goals: Progressing toward goals    Frequency    Min 2X/week      PT Plan Current plan remains  appropriate    Co-evaluation              AM-PAC PT "6 Clicks" Daily Activity  Outcome Measure  Difficulty turning over in bed (including adjusting bedclothes, sheets and blankets)?: A Little Difficulty moving from lying on back to sitting on the side of the bed? : Unable Difficulty sitting down on and standing up from a chair with arms (e.g., wheelchair, bedside commode, etc,.)?: Unable Help needed moving to and from a bed to chair (including a wheelchair)?: A Lot Help needed walking in hospital room?: A Lot Help needed climbing 3-5 steps with a railing? : Total 6 Click Score: 10    End of Session Equipment Utilized During Treatment: Gait belt Activity Tolerance: Patient tolerated treatment well;Patient limited by fatigue Patient left:  in chair;with chair alarm set;with call bell/phone within reach;with nursing/sitter in room Nurse Communication: Mobility status       Time: 1410-1433 PT Time Calculation (min) (ACUTE ONLY): 23 min  Charges:  $Therapeutic Exercise: 8-22 mins $Therapeutic Activity: 8-22 mins                    G Codes:      Chesley Noon, PTA 03/31/18, 2:40 PM

## 2018-03-31 NOTE — Care Management Important Message (Signed)
Copy left in patient's room. 

## 2018-03-31 NOTE — Clinical Social Work Note (Signed)
CSW presented bed offers to patient and she chose [facility].  CSW contacted [facility] and they can accept patient once insurance authorization has been received and patient is medically ready for discharge.  Jones Broom. [place], MSW, [facility]  03/31/2018 4:50 PM

## 2018-03-31 NOTE — Progress Notes (Signed)
[place] at [place] NAME: [name]    MR#:  [id number]  DATE OF BIRTH:  1951-08-26  SUBJECTIVE:   His p.o. intake is improving.  Creatinine noted to be worse today.  Patient's renal ultrasound was suggestive of bilateral hydronephrosis.  Patient was noted to have urinary retention and therefore is now status post Foley catheter placement.  Patient denies any abdominal pain or any other associated symptoms presently.  REVIEW OF SYSTEMS:    Review of Systems  Constitutional: Negative for chills and fever.  HENT: Negative for congestion and tinnitus.   Eyes: Negative for blurred vision and double vision.  Respiratory: Negative for cough, shortness of breath and wheezing.   Cardiovascular: Positive for leg swelling. Negative for chest pain, orthopnea and PND.  Gastrointestinal: Negative for abdominal pain, diarrhea, nausea and vomiting.  Genitourinary: Negative for dysuria and hematuria.  Neurological: Positive for weakness (Generalized weakness. ). Negative for dizziness, sensory change and focal weakness.  All other systems reviewed and are negative.   Nutrition: Heart healthy/Carb modified Tolerating Diet: Yes Tolerating PT: Await Eval.    DRUG ALLERGIES:   Allergies  Allergen Reactions  . Penicillins Swelling    Has patient had a PCN reaction causing immediate rash, facial/tongue/throat swelling, SOB or lightheadedness with hypotension: Unknown Has patient had a PCN reaction causing severe rash involving mucus membranes or skin necrosis: Unknown Has patient had a PCN reaction that required hospitalization: Unknown Has patient had a PCN reaction occurring within the last 10 years: Unknown If all of the above answers are "NO", then may proceed with Cephalosporin use.     VITALS:  Blood pressure 117/71, pulse 82, temperature 97.8 F (36.6 C), temperature source Oral, resp. rate 18, height 5' 4"  (1.626 m), weight 77.3 kg (170 lb 8  oz), SpO2 97 %.  PHYSICAL EXAMINATION:   Physical Exam  GENERAL:  67 y.o.-year-old patient lying in bed in no acute distress.  EYES: Pupils equal, round, reactive to light and accommodation. No scleral icterus. Extraocular muscles intact.  HEENT: Head atraumatic, normocephalic. Oropharynx and nasopharynx clear.  NECK:  Supple, no jugular venous distention. No thyroid enlargement, no tenderness.  LUNGS: Normal breath sounds bilaterally, no wheezing, rales, rhonchi. No use of accessory muscles of respiration.  CARDIOVASCULAR: S1, S2 normal. No murmurs, rubs, or gallops.  ABDOMEN: Soft, nontender, nondistended. Bowel sounds present. No organomegaly or mass.  EXTREMITIES: Chronic lymphedema bilaterally with significant lichenification and hyperkeratotic skin lesions throughout lower extremities.  Status post Unna wraps on lower extremities bilaterally. NEUROLOGIC: Cranial nerves II through XII are intact. No focal Motor or sensory deficits b/l.   PSYCHIATRIC: The patient is alert and oriented x 3.  SKIN: Hyperkeratotic skin lesions bilaterally. No Lesion, or ulcer.    LABORATORY PANEL:   CBC Recent Labs  Lab 03/30/18 1056  WBC 13.9*  HGB 10.0*  HCT 29.9*  PLT 315   ------------------------------------------------------------------------------------------------------------------  Chemistries  Recent Labs  Lab 03/31/18 0453  NA 134*  K 5.0  CL 109  CO2 20*  GLUCOSE 161*  BUN 74*  CREATININE 4.39*  CALCIUM 8.3*   ------------------------------------------------------------------------------------------------------------------  Cardiac Enzymes Recent Labs  Lab 03/26/18 2104  TROPONINI <0.03   ------------------------------------------------------------------------------------------------------------------  RADIOLOGY:  US Renal  Result Date: 03/30/2018 CLINICAL DATA:  67 year old female with acute renal failure. Initial encounter. EXAM: RENAL / URINARY TRACT ULTRASOUND  COMPLETE COMPARISON:  None. FINDINGS: Right Kidney: Length: 9.8 cm. Echogenicity within normal limits. Mild right hydronephrosis. No  mass identified. Left Kidney: Length: 12.4 cm. Echogenicity within normal limits. Moderate left hydronephrosis. No mass identified. Bladder: Appears normal for degree of bladder distention. Bilateral ureteral jets noted. IMPRESSION: Mild right-sided and moderate left-sided hydronephrosis. Electronically Signed   By: Genia Del M.D.   On: 03/30/2018 16:57     ASSESSMENT AND PLAN:   67 year old female with past medical history of diabetes, hyperlipidemia, diabetes, hypothyroidism, chronic lymphedema who presented to the hospital due to worsening lower extremity swelling and pain.  1.  Sepsis- patient met criteria on admission given her leukocytosis, tachycardia, lower extremity skin findings suggestive of cellulitis. -We will change IV vancomycin to oral doxycycline today.  Cultures are negative.  Patient has improved.  2.  Chronic lymphedema/bilateral lower extremity cellulitis- seen by the wound team is status post Unna wraps. -Appreciate podiatry and vascular input and continue follow-up with them as outpatient. - Antibiotic switch from IV vancomycin to oral doxycycline.  Unna boots need to be replaced weekly. -Patient has significant hyperkeratotic areas in the lower extremities and will refer to [facility] as outpatient.   3.  Diabetes type 2 without complication-continue sliding scale insulin.  BS stable.   4.  Hypothyroidism-continue Synthroid.  5.  Hyperlipidemia-continue Pravachol.  6.  Adult failure to thrive-placed on nutritional supplements, cont. Marinol.  - encourage PO intake.   7. Acute Renal failure -this is likely secondary to urinary retention/obstruction.  Patient's creatinine is above 4 today.  Renal ultrasound yesterday showed bilateral hydronephrosis. -Status post Foley catheter placement patient had over a liter urine output shortly  thereafter.  Continue IV fluids, follow BUN and creatinine.  Possible referred to urology as an outpatient.  Await physical therapy evaluation.   All the records are reviewed and case discussed with Care Management/Social Worker. Management plans discussed with the patient, family and they are in agreement.  CODE STATUS: Full code  DVT Prophylaxis: Hep SQ  TOTAL TIME TAKING CARE OF THIS PATIENT: 30 minutes.   POSSIBLE D/C IN 2-3 DAYS, DEPENDING ON CLINICAL CONDITION.   Henreitta Leber M.D on 03/31/2018 at 3:17 PM  Between 7am to 6pm - Pager - [phone]  After 6pm go to [email or web address] - password [email or web address]  Office  [phone]  CC: Primary care physician; Marguerita Merles, MD

## 2018-04-01 LAB — BASIC METABOLIC PANEL
ANION GAP: 3 — AB (ref 5–15)
BUN: 40 mg/dL — ABNORMAL HIGH (ref 6–20)
CO2: 20 mmol/L — AB (ref 22–32)
Calcium: 7.9 mg/dL — ABNORMAL LOW (ref 8.9–10.3)
Chloride: 113 mmol/L — ABNORMAL HIGH (ref 101–111)
Creatinine, Ser: 1.25 mg/dL — ABNORMAL HIGH (ref 0.44–1.00)
GFR, EST AFRICAN AMERICAN: 51 mL/min — AB (ref >60)
GFR, EST NON AFRICAN AMERICAN: 44 mL/min — AB (ref >60)
GLUCOSE: 164 mg/dL — AB (ref 65–99)
POTASSIUM: 4.4 mmol/L (ref 3.5–5.1)
Sodium: 136 mmol/L (ref 135–145)

## 2018-04-01 LAB — GLUCOSE, CAPILLARY
GLUCOSE-CAPILLARY: 276 mg/dL — AB (ref 65–99)
GLUCOSE-CAPILLARY: 293 mg/dL — AB (ref 65–99)
Glucose-Capillary: 163 mg/dL — ABNORMAL HIGH (ref 65–99)
Glucose-Capillary: 176 mg/dL — ABNORMAL HIGH (ref 65–99)

## 2018-04-01 MED ORDER — TAMSULOSIN HCL 0.4 MG PO CAPS
0.4000 mg | ORAL_CAPSULE | Freq: Every day | ORAL | Status: DC
  Administered 2018-04-01 – 2018-04-03 (×3): 0.4 mg via ORAL
  Filled 2018-04-01 (×3): qty 1

## 2018-04-01 MED ORDER — BISACODYL 10 MG RE SUPP
10.0000 mg | Freq: Once | RECTAL | Status: AC
  Administered 2018-04-02: 10 mg via RECTAL
  Filled 2018-04-01: qty 1

## 2018-04-01 NOTE — Progress Notes (Signed)
[place] at [place] NAME: [name]    MR#:  [id number]  DATE OF BIRTH:  [birth year]  SUBJECTIVE:   P.o. intake has improved, patient's creatinine has improved. No other acute events overnight.    REVIEW OF SYSTEMS:    Review of Systems  Constitutional: Negative for chills and fever.  HENT: Negative for congestion and tinnitus.   Eyes: Negative for blurred vision and double vision.  Respiratory: Negative for cough, shortness of breath and wheezing.   Cardiovascular: Positive for leg swelling. Negative for chest pain, orthopnea and PND.  Gastrointestinal: Negative for abdominal pain, diarrhea, nausea and vomiting.  Genitourinary: Negative for dysuria and hematuria.  Neurological: Positive for weakness (Generalized weakness. ). Negative for dizziness, sensory change and focal weakness.  All other systems reviewed and are negative.   Nutrition: Heart healthy/Carb modified Tolerating Diet: Yes Tolerating PT: Await Eval.    DRUG ALLERGIES:   Allergies  Allergen Reactions  . Penicillins Swelling    Has patient had a PCN reaction causing immediate rash, facial/tongue/throat swelling, SOB or lightheadedness with hypotension: Unknown Has patient had a PCN reaction causing severe rash involving mucus membranes or skin necrosis: Unknown Has patient had a PCN reaction that required hospitalization: Unknown Has patient had a PCN reaction occurring within the last 10 years: Unknown If all of the above answers are "NO", then may proceed with Cephalosporin use.     VITALS:  Blood pressure (!) 91/58, pulse 78, temperature 98.7 F (37.1 C), temperature source Oral, resp. rate 19, height 5' 4"  (1.626 m), weight 77.3 kg (170 lb 8 oz), SpO2 94 %.  PHYSICAL EXAMINATION:   Physical Exam  GENERAL:  67 y.o.-year-old patient lying in bed in no acute distress.  EYES: Pupils equal, round, reactive to light and accommodation. No scleral icterus.  Extraocular muscles intact.  HEENT: Head atraumatic, normocephalic. Oropharynx and nasopharynx clear.  NECK:  Supple, no jugular venous distention. No thyroid enlargement, no tenderness.  LUNGS: Normal breath sounds bilaterally, no wheezing, rales, rhonchi. No use of accessory muscles of respiration.  CARDIOVASCULAR: S1, S2 normal. No murmurs, rubs, or gallops.  ABDOMEN: Soft, nontender, nondistended. Bowel sounds present. No organomegaly or mass.  EXTREMITIES: Chronic lymphedema bilaterally with significant lichenification and hyperkeratotic skin lesions throughout lower extremities.  Status post Unna wraps on lower extremities bilaterally. NEUROLOGIC: Cranial nerves II through XII are intact. No focal Motor or sensory deficits b/l.   PSYCHIATRIC: The patient is alert and oriented x 3.  SKIN: Hyperkeratotic skin lesions bilaterally. No Lesion, or ulcer.    LABORATORY PANEL:   CBC Recent Labs  Lab 03/30/18 1056  WBC 13.9*  HGB 10.0*  HCT 29.9*  PLT 315   ------------------------------------------------------------------------------------------------------------------  Chemistries  Recent Labs  Lab 04/01/18 0906  NA 136  K 4.4  CL 113*  CO2 20*  GLUCOSE 164*  BUN 40*  CREATININE 1.25*  CALCIUM 7.9*   ------------------------------------------------------------------------------------------------------------------  Cardiac Enzymes Recent Labs  Lab 03/26/18 2104  TROPONINI <0.03   ------------------------------------------------------------------------------------------------------------------  RADIOLOGY:  US Renal  Result Date: 03/30/2018 CLINICAL DATA:  67 year old female with acute renal failure. Initial encounter. EXAM: RENAL / URINARY TRACT ULTRASOUND COMPLETE COMPARISON:  None. FINDINGS: Right Kidney: Length: 9.8 cm. Echogenicity within normal limits. Mild right hydronephrosis. No mass identified. Left Kidney: Length: 12.4 cm. Echogenicity within normal limits.  Moderate left hydronephrosis. No mass identified. Bladder: Appears normal for degree of bladder distention. Bilateral ureteral jets noted. IMPRESSION: Mild right-sided and moderate  left-sided hydronephrosis. Electronically Signed   By: Genia Del M.D.   On: 03/30/2018 16:57     ASSESSMENT AND PLAN:   67 year old female with past medical history of diabetes, hyperlipidemia, diabetes, hypothyroidism, chronic lymphedema who presented to the hospital due to worsening lower extremity swelling and pain.  1.  Sepsis- patient met criteria on admission given her leukocytosis, tachycardia, lower extremity skin findings suggestive of cellulitis. -We will change IV vancomycin to oral doxycycline today.  Cultures are negative.  Patient has improved.   2.  Chronic lymphedema/bilateral lower extremity cellulitis- seen by the wound team is status post Unna wraps. -Appreciate podiatry and vascular input and continue follow-up with them as outpatient. -  Cont. Oral doxy.  Unna boots need to be replaced weekly. -Patient has significant hyperkeratotic areas in the lower extremities and refer to [facility] as outpatient.   3.  Diabetes type 2 without complication-continue sliding scale insulin.  BS stable.   4.  Hypothyroidism-continue Synthroid.  5.  Hyperlipidemia-continue Pravachol.  6.  Adult failure to thrive-placed on nutritional supplements, cont. Marinol.  - PO intake is improving.    7. Acute Renal failure -this is likely secondary to urinary retention/obstruction.  Patient's creatinine down from 4 to 1.2 today. Will d/c IV fluids and remove foley and do post-void residual later today.  - start Flomax.  - will referred to urology as an outpatient.  Appreciate PT eval patient will likely need short-term rehab upon discharge.  She prefers [facility], [facility] is aware.  Possible d/c there tomorrow.    All the records are reviewed and case discussed with Care Management/Social  Worker. Management plans discussed with the patient, family and they are in agreement.  CODE STATUS: Full code  DVT Prophylaxis: Hep SQ  TOTAL TIME TAKING CARE OF THIS PATIENT: 30 minutes.   POSSIBLE D/C IN 1-2 DAYS, DEPENDING ON CLINICAL CONDITION.   Henreitta Leber M.D on 04/01/2018 at 2:35 PM  Between 7am to 6pm - Pager - [phone]  After 6pm go to [email or web address] - password [email or web address] [facility]  Office  [phone]  CC: Primary care physician; Marguerita Merles, MD

## 2018-04-01 NOTE — Progress Notes (Addendum)
Inpatient Diabetes Program Recommendations  AACE/ADA: New Consensus Statement on Inpatient Glycemic Control (2015)  Target Ranges:  Prepandial:   less than 140 mg/dL      Peak postprandial:   less than 180 mg/dL (1-2 hours)      Critically ill patients:  140 - 180 mg/dL   Results for [name], [name] (MRN [id number]) as of 04/01/2018 12:51  Ref. Range 03/31/2018 07:26 03/31/2018 11:08 03/31/2018 16:33 03/31/2018 21:18  Glucose-Capillary Latest Ref Range: 65 - 99 mg/dL 145 (H) 196 (H) 235 (H) 248 (H)    Home DM Meds: Glipizide 5 mg daily       Tradjenta 5 mg daily         Metformin 500 mg BID  Current Insulin Orders: Novolog Sensitive Correction Scale/ SSI (0-9 units) TID AC + HS      MD- Please consider increasing Novolog SSI to Moderate scale (0-15 units) TID AC + HS     --Will follow patient during hospitalization--  Wyn Quaker RN, MSN, CDE Diabetes Coordinator Inpatient Glycemic Control Team Team Pager: [phone] (8a-5p)

## 2018-04-01 NOTE — Progress Notes (Signed)
Physical Therapy Treatment Patient Details Name: [name] MRN: [id number] DOB: Aug 29, 1951 Today's Date: 04/01/2018    History of Present Illness presented to ER secondary to worsening LE edema, wounds; admitted with sepsis related to diabetic foot ulceration/infection.    PT Comments    Pt reports that she doesn't feel well enought to attempt mobility but agrees to bed exercises. She is able to complete all bed exercises as instructed with excellent motivation. Will continue to progress bed mobility, transfers, and ambulation as pt feels better. Pt will benefit from PT services to address deficits in strength, balance, and mobility in order to return to full function at home.     Follow Up Recommendations  SNF     Equipment Recommendations  Rolling walker with 5" wheels    Recommendations for Other Services       Precautions / Restrictions Precautions Precautions: Fall Restrictions Weight Bearing Restrictions: No    Mobility  Bed Mobility               General bed mobility comments: Pt reports that she doesn't feel well enought to attempt mobility but agrees to bed exercises  Transfers                    Ambulation/Gait                 Stairs            Wheelchair Mobility    Modified Rankin (Stroke Patients Only)       Balance                                            Cognition Arousal/Alertness: Awake/alert Behavior During Therapy: WFL for tasks assessed/performed Overall Cognitive Status: Within Functional Limits for tasks assessed                                        Exercises General Exercises - Lower Extremity Ankle Circles/Pumps: Both;15 reps Quad Sets: Both;15 reps Gluteal Sets: Both;15 reps Heel Slides: Both;15 reps Hip ABduction/ADduction: Both;15 reps Straight Leg Raises: Both;15 reps    General Comments        Pertinent Vitals/Pain Pain Assessment: 0-10 Pain  Score: 6  Pain Location: LLE Pain Descriptors / Indicators: Aching;Grimacing    Home Living                      Prior Function            PT Goals (current goals can now be found in the care plan section) Acute Rehab PT Goals Patient Stated Goal: to try to get up PT Goal Formulation: With patient Time For Goal Achievement: 04/13/18 Potential to Achieve Goals: Good Progress towards PT goals: Progressing toward goals    Frequency    Min 2X/week      PT Plan Current plan remains appropriate    Co-evaluation              AM-PAC PT "6 Clicks" Daily Activity  Outcome Measure  Difficulty turning over in bed (including adjusting bedclothes, sheets and blankets)?: A Little Difficulty moving from lying on back to sitting on the side of the bed? : Unable Difficulty sitting down on and standing up from a chair  with arms (e.g., wheelchair, bedside commode, etc,.)?: Unable Help needed moving to and from a bed to chair (including a wheelchair)?: A Lot Help needed walking in hospital room?: A Lot Help needed climbing 3-5 steps with a railing? : Total 6 Click Score: 10    End of Session Equipment Utilized During Treatment: Gait belt Activity Tolerance: Patient tolerated treatment well Patient left: with call bell/phone within reach;in bed;with bed alarm set   PT Visit Diagnosis: Muscle weakness (generalized) (M62.81);Difficulty in walking, not elsewhere classified (R26.2);Unsteadiness on feet (R26.81)     Time: 1219-7588 PT Time Calculation (min) (ACUTE ONLY): 9 min  Charges:  $Therapeutic Exercise: 8-22 mins                    G Codes:       Lyndel Safe Jeanna Giuffre PT, DPT     Wandra Babin 04/01/2018, 5:07 PM

## 2018-04-01 NOTE — Progress Notes (Signed)
Patients foley catheter removed per MDs orders. Tolerated well. Patient educated to call for assistance when needing to get OOB. Will assess and monitor for retention. Patient is also refusing Dulcolax suppository after multiple attempts to give and is requesting prune juice instead. Educated patient on importance and will continue to encourage patient to accept.

## 2018-04-02 LAB — GLUCOSE, CAPILLARY
Glucose-Capillary: 172 mg/dL — ABNORMAL HIGH (ref 65–99)
Glucose-Capillary: 150 mg/dL — ABNORMAL HIGH (ref 65–99)
Glucose-Capillary: 136 mg/dL — ABNORMAL HIGH (ref 65–99)
Glucose-Capillary: 190 mg/dL — ABNORMAL HIGH (ref 65–99)

## 2018-04-02 MED ORDER — DRONABINOL 2.5 MG PO CAPS: 2.5000 mg | ORAL_CAPSULE | Freq: Two times a day (BID) | ORAL | 0 refills | Status: DC

## 2018-04-02 MED ORDER — LEVOTHYROXINE SODIUM 125 MCG PO TABS: 125.0000 ug | ORAL_TABLET | Freq: Every day | ORAL | 0 refills | Status: DC

## 2018-04-02 MED ORDER — PREMIER PROTEIN SHAKE: 11.0000 [oz_av] | Freq: Two times a day (BID) | ORAL | 0 refills | Status: AC

## 2018-04-02 MED ORDER — BISACODYL 10 MG RE SUPP
10.0000 mg | Freq: Every day | RECTAL | Status: DC
  Administered 2018-04-02: 10 mg via RECTAL
  Filled 2018-04-02: qty 1

## 2018-04-02 MED ORDER — TAMSULOSIN HCL 0.4 MG PO CAPS: 0.4000 mg | ORAL_CAPSULE | Freq: Every day | ORAL | 0 refills | Status: AC

## 2018-04-02 MED ORDER — FLEET ENEMA 7-19 GM/118ML RE ENEM
1.0000 | ENEMA | Freq: Once | RECTAL | Status: AC
  Administered 2018-04-02: 1 via RECTAL

## 2018-04-02 MED ORDER — DOXYCYCLINE HYCLATE 100 MG PO TABS: 100.0000 mg | ORAL_TABLET | Freq: Two times a day (BID) | ORAL | 0 refills | Status: AC

## 2018-04-02 NOTE — Plan of Care (Signed)
  Problem: Education: Goal: Knowledge of General Education information will improve Outcome: Progressing   Problem: Safety: Goal: Ability to remain free from injury will improve Outcome: Progressing   

## 2018-04-02 NOTE — Discharge Summary (Signed)
[facility] [place] NAME: [name]    MR#:  [id number]  DATE OF BIRTH:  [birth year]  DATE OF ADMISSION:  03/26/2018 ADMITTING PHYSICIAN: Lance Coon, MD  DATE OF DISCHARGE: 04/02/2018  PRIMARY CARE PHYSICIAN: Marguerita Merles, MD    ADMISSION DIAGNOSIS:  Peripheral edema [R60.9] Cellulitis, unspecified cellulitis site [L03.90]  DISCHARGE DIAGNOSIS:  Principal Problem:   Diabetic infection of left foot ([place]) Active Problems:   Chronic venous insufficiency   Type 2 diabetes mellitus with insulin therapy ([place])   Hyperlipidemia   Sepsis ([place])   AKI (acute kidney injury) ([place])   Pressure injury of skin   SECONDARY DIAGNOSIS:   Past Medical History:  Diagnosis Date  . Diabetes mellitus without complication ([place])   . HLD (hyperlipidemia)     HOSPITAL COURSE:    67 year old female with past medical history of diabetes, hyperlipidemia, diabetes, hypothyroidism, chronic lymphedema who presented to the hospital due to worsening lower extremity swelling and pain.  1.  Sepsis: Patient presented withleukocytosis, tachycardia. Sepsis is due to lower extremity cellulitis.  Vancomycin was changed oral doxycycline upon discharge.   2.  Chronic lymphedema/bilateral lower extremity cellulitis: Patient was evaluated by the wound team recommendations for Unna wraps. He was also evaluated bypodiatry and vascular input. Unna boots need to be replaced weekly. Patient has significant hyperkeratotic areas in the lower extremities. She may benefit from outpatient dermatology consultation. This can be reevaluated by her PCP. Continue doxycycline for the treatment of cellulitis.  3.  Diabetes type 2 without complication: Patient will be discharged on oral glipizide with ADA diet  4.  Hypothyroidism: Patient will continue Synthroid 5.  Hyperlipidemia: Patient will continue statin  6.  Adult failure to thrive with protein calorie malnutrition:  Patient will continue nutritional supplements and Marinol to help with appetite. Outpatient palliative care consultation place. Her oral intake is improving  7. Acute kidney injury: This is due to urinary retention/obstruction. Patient will need Foley upon discharge. She is started on Flomax. Her creatinine has improved. She needs a voiding trial in 1 week. She will have urology follow-up in one week.  8. Constipation: Patient was started on enema and suppository.  DISCHARGE CONDITIONS AND DIET:   Stable for discharge on diabetic diet  CONSULTS OBTAINED:  Treatment Team:  Albertine Patricia, Jeni Salles, MD  DRUG ALLERGIES:   Allergies  Allergen Reactions  . Penicillins Swelling    Has patient had a PCN reaction causing immediate rash, facial/tongue/throat swelling, SOB or lightheadedness with hypotension: Unknown Has patient had a PCN reaction causing severe rash involving mucus membranes or skin necrosis: Unknown Has patient had a PCN reaction that required hospitalization: Unknown Has patient had a PCN reaction occurring within the last 10 years: Unknown If all of the above answers are "NO", then may proceed with Cephalosporin use.     DISCHARGE MEDICATIONS:   Allergies as of 04/02/2018      Reactions   Penicillins Swelling   Has patient had a PCN reaction causing immediate rash, facial/tongue/throat swelling, SOB or lightheadedness with hypotension: Unknown Has patient had a PCN reaction causing severe rash involving mucus membranes or skin necrosis: Unknown Has patient had a PCN reaction that required hospitalization: Unknown Has patient had a PCN reaction occurring within the last 10 years: Unknown If all of the above answers are "NO", then may proceed with Cephalosporin use.      Medication List    STOP taking these medications  ammonium lactate 12 % cream Commonly known as:  AMLACTIN   aspirin 81 MG chewable tablet   cetirizine 10 MG tablet Commonly  known as:  ZYRTEC   hydrochlorothiazide 25 MG tablet Commonly known as:  HYDRODIURIL   linagliptin 5 MG Tabs tablet Commonly known as:  TRADJENTA   magnesium oxide 400 MG tablet Commonly known as:  MAG-OX   metFORMIN 500 MG 24 hr tablet Commonly known as:  GLUCOPHAGE-XR   triamcinolone ointment 0.1 % Commonly known as:  KENALOG     TAKE these medications   doxycycline 100 MG tablet Commonly known as:  VIBRA-TABS Take 1 tablet (100 mg total) by mouth 2 (two) times daily with a meal for 7 days.   dronabinol 2.5 MG capsule Commonly known as:  MARINOL Take 1 capsule (2.5 mg total) by mouth 2 (two) times daily before lunch and supper.   glipiZIDE 5 MG tablet Commonly known as:  GLUCOTROL Take by mouth daily before breakfast.   levothyroxine 125 MCG tablet Commonly known as:  SYNTHROID, LEVOTHROID Take 1 tablet (125 mcg total) by mouth daily before breakfast. Start taking on:  04/03/2018 What changed:    medication strength  how much to take   protein supplement shake Liqd Commonly known as:  PREMIER PROTEIN Take 325 mLs (11 oz total) by mouth 2 (two) times daily between meals.   tamsulosin 0.4 MG Caps capsule Commonly known as:  FLOMAX Take 1 capsule (0.4 mg total) by mouth daily. Start taking on:  04/03/2018         Today   CHIEF COMPLAINT:   Issue doing well. Nurse reports that she had some nausea with constipation this morning   VITAL SIGNS:  Blood pressure (!) 95/59, pulse 77, temperature 98.3 F (36.8 C), temperature source Oral, resp. rate 18, height 5\' 4"  (1.626 m), weight 77.3 kg (170 lb 8 oz), SpO2 99 %.   REVIEW OF SYSTEMS:  Review of Systems  Constitutional: Negative.  Negative for chills, fever and malaise/fatigue.  HENT: Negative.  Negative for ear discharge, ear pain, hearing loss, nosebleeds and sore throat.   Eyes: Negative.  Negative for blurred vision and pain.  Respiratory: Negative.  Negative for cough, hemoptysis, shortness of  breath and wheezing.   Cardiovascular: Negative.  Negative for chest pain, palpitations and leg swelling.  Gastrointestinal: Positive for constipation and nausea. Negative for abdominal pain, blood in stool, diarrhea and vomiting.  Genitourinary: Negative.  Negative for dysuria.  Musculoskeletal: Negative.  Negative for back pain.  Skin: Negative.   Neurological: Negative for dizziness, tremors, speech change, focal weakness, seizures and headaches.  Endo/Heme/Allergies: Negative.  Does not bruise/bleed easily.  Psychiatric/Behavioral: Negative.  Negative for depression, hallucinations and suicidal ideas.     PHYSICAL EXAMINATION:  GENERAL:  67 y.o.-year-old patient lying in the bed with no acute distress.  NECK:  Supple, no jugular venous distention. No thyroid enlargement, no tenderness.  LUNGS: Normal breath sounds bilaterally, no wheezing, rales,rhonchi  No use of accessory muscles of respiration.  CARDIOVASCULAR: S1, S2 normal. No murmurs, rubs, or gallops.  ABDOMEN: Soft, non-tender, non-distended. Bowel sounds present. No organomegaly or mass.  EXTREMITIES: No pedal edema, cyanosis, or clubbing.  PSYCHIATRIC: The patient is alert and oriented x 3.  SKIN: No obvious rash, lesion, or ulcer.   DATA REVIEW:   CBC Recent Labs  Lab 03/30/18 1056  WBC 13.9*  HGB 10.0*  HCT 29.9*  PLT 315    Chemistries  Recent Labs  Lab 04/01/18  0906  NA 136  K 4.4  CL 113*  CO2 20*  GLUCOSE 164*  BUN 40*  CREATININE 1.25*  CALCIUM 7.9*    Cardiac Enzymes Recent Labs  Lab 03/26/18 2104  TROPONINI <0.03    Microbiology Results  @MICRORSLT48 @  RADIOLOGY:  No results found.    Allergies as of 04/02/2018      Reactions   Penicillins Swelling   Has patient had a PCN reaction causing immediate rash, facial/tongue/throat swelling, SOB or lightheadedness with hypotension: Unknown Has patient had a PCN reaction causing severe rash involving mucus membranes or skin necrosis:  Unknown Has patient had a PCN reaction that required hospitalization: Unknown Has patient had a PCN reaction occurring within the last 10 years: Unknown If all of the above answers are "NO", then may proceed with Cephalosporin use.      Medication List    STOP taking these medications   ammonium lactate 12 % cream Commonly known as:  AMLACTIN   aspirin 81 MG chewable tablet   cetirizine 10 MG tablet Commonly known as:  ZYRTEC   hydrochlorothiazide 25 MG tablet Commonly known as:  HYDRODIURIL   linagliptin 5 MG Tabs tablet Commonly known as:  TRADJENTA   magnesium oxide 400 MG tablet Commonly known as:  MAG-OX   metFORMIN 500 MG 24 hr tablet Commonly known as:  GLUCOPHAGE-XR   triamcinolone ointment 0.1 % Commonly known as:  KENALOG     TAKE these medications   doxycycline 100 MG tablet Commonly known as:  VIBRA-TABS Take 1 tablet (100 mg total) by mouth 2 (two) times daily with a meal for 7 days.   dronabinol 2.5 MG capsule Commonly known as:  MARINOL Take 1 capsule (2.5 mg total) by mouth 2 (two) times daily before lunch and supper.   glipiZIDE 5 MG tablet Commonly known as:  GLUCOTROL Take by mouth daily before breakfast.   levothyroxine 125 MCG tablet Commonly known as:  SYNTHROID, LEVOTHROID Take 1 tablet (125 mcg total) by mouth daily before breakfast. Start taking on:  04/03/2018 What changed:    medication strength  how much to take   protein supplement shake Liqd Commonly known as:  PREMIER PROTEIN Take 325 mLs (11 oz total) by mouth 2 (two) times daily between meals.   tamsulosin 0.4 MG Caps capsule Commonly known as:  FLOMAX Take 1 capsule (0.4 mg total) by mouth daily. Start taking on:  04/03/2018           Management plans discussed with the patient and she is in agreement. Stable for discharge snf  Patient should follow up with pcp  CODE STATUS:     Code Status Orders  (From admission, onward)        Start     Ordered    03/27/18 0027  Full code  Continuous     03/27/18 0026    Code Status History    This patient has a current code status but no historical code status.      TOTAL TIME TAKING CARE OF THIS PATIENT: 38 minutes.    Note: This dictation was prepared with Dragon dictation along with smaller phrase technology. Any transcriptional errors that result from this process are unintentional.  Aariv Medlock M.D on 04/02/2018 at 11:17 AM  Between 7am to 6pm - Pager - [phone] After 6pm go to [email or web address] - password [email or web address] [facility]  Office  [phone]  CC: Primary care physician; Marguerita Merles, MD

## 2018-04-02 NOTE — Clinical Social Work Note (Signed)
CSW called [facility] they have not received insurance authorization yet, CSW sent updated PT notes.  Jones Broom. [place], MSW, [place]  04/02/2018 5:20 PM

## 2018-04-02 NOTE — Plan of Care (Signed)
  Problem: Safety: Goal: Ability to remain free from injury will improve Outcome: Progressing   

## 2018-04-03 LAB — CBC
HCT: 26 % — ABNORMAL LOW (ref 35.0–47.0)
Hemoglobin: 8.8 g/dL — ABNORMAL LOW (ref 12.0–16.0)
MCH: 29.1 pg (ref 26.0–34.0)
MCHC: 34 g/dL (ref 32.0–36.0)
MCV: 85.5 fL (ref 80.0–100.0)
Platelets: 381 10*3/uL (ref 150–440)
RBC: 3.04 MIL/uL — ABNORMAL LOW (ref 3.80–5.20)
RDW: 15.7 % — ABNORMAL HIGH (ref 11.5–14.5)
WBC: 9 10*3/uL (ref 3.6–11.0)

## 2018-04-03 LAB — GLUCOSE, CAPILLARY
GLUCOSE-CAPILLARY: 196 mg/dL — AB (ref 65–99)
Glucose-Capillary: 177 mg/dL — ABNORMAL HIGH (ref 65–99)
Glucose-Capillary: 230 mg/dL — ABNORMAL HIGH (ref 65–99)

## 2018-04-03 NOTE — Discharge Summary (Signed)
[facility] [place] NAME: [name]    MR#:  [id number]  DATE OF BIRTH:  [birth year]  DATE OF ADMISSION:  03/26/2018 ADMITTING PHYSICIAN: Lance Coon, MD  DATE OF DISCHARGE: 04/02/2018  PRIMARY CARE PHYSICIAN: Marguerita Merles, MD    ADMISSION DIAGNOSIS:  Peripheral edema [R60.9] Cellulitis, unspecified cellulitis site [L03.90]  DISCHARGE DIAGNOSIS:  Principal Problem:   Diabetic infection of left foot ([place]) Active Problems:   Chronic venous insufficiency   Type 2 diabetes mellitus with insulin therapy ([place])   Hyperlipidemia   Sepsis ([place])   AKI (acute kidney injury) ([place])   Pressure injury of skin   SECONDARY DIAGNOSIS:   Past Medical History:  Diagnosis Date  . Diabetes mellitus without complication ([place])   . HLD (hyperlipidemia)     HOSPITAL COURSE:    67 year old female with past medical history of diabetes, hyperlipidemia, diabetes, hypothyroidism, chronic lymphedema who presented to the hospital due to worsening lower extremity swelling and pain.  1.  Sepsis: Patient presented with leukocytosis, tachycardia. Sepsis is due to lower extremity cellulitis.  Vancomycin was changed oral doxycycline upon discharge.  2.  Chronic lymphedema/bilateral lower extremity cellulitis: Patient was evaluated by the wound team recommendations for Unna wraps. He was also evaluated bypodiatry and vascular input. Unna boots need to be replaced weekly. Patient has significant hyperkeratotic areas in the lower extremities. She may benefit from outpatient dermatology consultation. This can be reevaluated by her PCP. Continue doxycycline for the treatment of cellulitis.  3.  Diabetes type 2 without complication: Patient will be discharged on oral glipizide with ADA diet  4.  Hypothyroidism: Patient will continue Synthroid  5.  Hyperlipidemia: Patient will continue statin.  6.  Adult failure to thrive with protein calorie  malnutrition: Patient will continue nutritional supplements and Marinol to help with appetite. Outpatient palliative care consultation place. Her oral intake is improving  7. Acute kidney injury: This is due to urinary retention/obstruction. Patient will need Foley upon discharge. She is started on Flomax. Her creatinine has improved. She needs a voiding trial in 1 week. She will have urology follow-up in one week.   8. Constipation: Patient was started on enema and suppository.  DISCHARGE CONDITIONS AND DIET:   Stable for discharge on diabetic diet  CONSULTS OBTAINED:  Treatment Team:  Albertine Patricia, Jeni Salles, MD  DRUG ALLERGIES:   Allergies  Allergen Reactions  . Penicillins Swelling    Has patient had a PCN reaction causing immediate rash, facial/tongue/throat swelling, SOB or lightheadedness with hypotension: Unknown Has patient had a PCN reaction causing severe rash involving mucus membranes or skin necrosis: Unknown Has patient had a PCN reaction that required hospitalization: Unknown Has patient had a PCN reaction occurring within the last 10 years: Unknown If all of the above answers are "NO", then may proceed with Cephalosporin use.     DISCHARGE MEDICATIONS:   Allergies as of 04/03/2018      Reactions   Penicillins Swelling   Has patient had a PCN reaction causing immediate rash, facial/tongue/throat swelling, SOB or lightheadedness with hypotension: Unknown Has patient had a PCN reaction causing severe rash involving mucus membranes or skin necrosis: Unknown Has patient had a PCN reaction that required hospitalization: Unknown Has patient had a PCN reaction occurring within the last 10 years: Unknown If all of the above answers are "NO", then may proceed with Cephalosporin use.      Medication List    STOP taking  these medications   ammonium lactate 12 % cream Commonly known as:  AMLACTIN   aspirin 81 MG chewable tablet   cetirizine 10 MG  tablet Commonly known as:  ZYRTEC   hydrochlorothiazide 25 MG tablet Commonly known as:  HYDRODIURIL   linagliptin 5 MG Tabs tablet Commonly known as:  TRADJENTA   magnesium oxide 400 MG tablet Commonly known as:  MAG-OX   metFORMIN 500 MG 24 hr tablet Commonly known as:  GLUCOPHAGE-XR   triamcinolone ointment 0.1 % Commonly known as:  KENALOG     TAKE these medications   doxycycline 100 MG tablet Commonly known as:  VIBRA-TABS Take 1 tablet (100 mg total) by mouth 2 (two) times daily with a meal for 7 days.   dronabinol 2.5 MG capsule Commonly known as:  MARINOL Take 1 capsule (2.5 mg total) by mouth 2 (two) times daily before lunch and supper.   glipiZIDE 5 MG tablet Commonly known as:  GLUCOTROL Take by mouth daily before breakfast.   levothyroxine 125 MCG tablet Commonly known as:  SYNTHROID, LEVOTHROID Take 1 tablet (125 mcg total) by mouth daily before breakfast. What changed:    medication strength  how much to take   protein supplement shake Liqd Commonly known as:  PREMIER PROTEIN Take 325 mLs (11 oz total) by mouth 2 (two) times daily between meals.   tamsulosin 0.4 MG Caps capsule Commonly known as:  FLOMAX Take 1 capsule (0.4 mg total) by mouth daily.         Today   CHIEF COMPLAINT:   Issue doing well. Nurse reports that she had some nausea with constipation this morning   VITAL SIGNS:  Blood pressure (!) 88/53, pulse 78, temperature 97.9 F (36.6 C), temperature source Oral, resp. rate 18, height 5\' 4"  (1.626 m), weight 77.3 kg (170 lb 8 oz), SpO2 97 %.   REVIEW OF SYSTEMS:  Review of Systems  Constitutional: Negative.  Negative for chills, fever and malaise/fatigue.  HENT: Negative.  Negative for ear discharge, ear pain, hearing loss, nosebleeds and sore throat.   Eyes: Negative.  Negative for blurred vision and pain.  Respiratory: Negative.  Negative for cough, hemoptysis, shortness of breath and wheezing.   Cardiovascular:  Negative.  Negative for chest pain, palpitations and leg swelling.  Gastrointestinal: Positive for constipation and nausea. Negative for abdominal pain, blood in stool, diarrhea and vomiting.  Genitourinary: Negative.  Negative for dysuria.  Musculoskeletal: Negative.  Negative for back pain.  Skin: Negative.   Neurological: Negative for dizziness, tremors, speech change, focal weakness, seizures and headaches.  Endo/Heme/Allergies: Negative.  Does not bruise/bleed easily.  Psychiatric/Behavioral: Negative.  Negative for depression, hallucinations and suicidal ideas.     PHYSICAL EXAMINATION:  GENERAL:  67 y.o.-year-old patient lying in the bed with no acute distress.  NECK:  Supple, no jugular venous distention. No thyroid enlargement, no tenderness.  LUNGS: Normal breath sounds bilaterally, no wheezing, rales,rhonchi  No use of accessory muscles of respiration.  CARDIOVASCULAR: S1, S2 normal. No murmurs, rubs, or gallops.  ABDOMEN: Soft, non-tender, non-distended. Bowel sounds present. No organomegaly or mass.  EXTREMITIES: No pedal edema, cyanosis, or clubbing.  PSYCHIATRIC: The patient is alert and oriented x 3.  SKIN: No obvious rash, lesion, or ulcer.   DATA REVIEW:   CBC Recent Labs  Lab 03/30/18 1056  WBC 13.9*  HGB 10.0*  HCT 29.9*  PLT 315    Chemistries  Recent Labs  Lab 04/01/18 0906  NA 136  K  4.4  CL 113*  CO2 20*  GLUCOSE 164*  BUN 40*  CREATININE 1.25*  CALCIUM 7.9*    Cardiac Enzymes No results for input(s): TROPONINI in the last 168 hours.  Microbiology Results  @MICRORSLT48 @  RADIOLOGY:  No results found.    Allergies as of 04/03/2018      Reactions   Penicillins Swelling   Has patient had a PCN reaction causing immediate rash, facial/tongue/throat swelling, SOB or lightheadedness with hypotension: Unknown Has patient had a PCN reaction causing severe rash involving mucus membranes or skin necrosis: Unknown Has patient had a PCN  reaction that required hospitalization: Unknown Has patient had a PCN reaction occurring within the last 10 years: Unknown If all of the above answers are "NO", then may proceed with Cephalosporin use.      Medication List    STOP taking these medications   ammonium lactate 12 % cream Commonly known as:  AMLACTIN   aspirin 81 MG chewable tablet   cetirizine 10 MG tablet Commonly known as:  ZYRTEC   hydrochlorothiazide 25 MG tablet Commonly known as:  HYDRODIURIL   linagliptin 5 MG Tabs tablet Commonly known as:  TRADJENTA   magnesium oxide 400 MG tablet Commonly known as:  MAG-OX   metFORMIN 500 MG 24 hr tablet Commonly known as:  GLUCOPHAGE-XR   triamcinolone ointment 0.1 % Commonly known as:  KENALOG     TAKE these medications   doxycycline 100 MG tablet Commonly known as:  VIBRA-TABS Take 1 tablet (100 mg total) by mouth 2 (two) times daily with a meal for 7 days.   dronabinol 2.5 MG capsule Commonly known as:  MARINOL Take 1 capsule (2.5 mg total) by mouth 2 (two) times daily before lunch and supper.   glipiZIDE 5 MG tablet Commonly known as:  GLUCOTROL Take by mouth daily before breakfast.   levothyroxine 125 MCG tablet Commonly known as:  SYNTHROID, LEVOTHROID Take 1 tablet (125 mcg total) by mouth daily before breakfast. What changed:    medication strength  how much to take   protein supplement shake Liqd Commonly known as:  PREMIER PROTEIN Take 325 mLs (11 oz total) by mouth 2 (two) times daily between meals.   tamsulosin 0.4 MG Caps capsule Commonly known as:  FLOMAX Take 1 capsule (0.4 mg total) by mouth daily.           Management plans discussed with the patient and she is in agreement. Stable for discharge snf  Patient should follow up with pcp  CODE STATUS:     Code Status Orders  (From admission, onward)        Start     Ordered   03/27/18 0027  Full code  Continuous     03/27/18 0026    Code Status History    This  patient has a current code status but no historical code status.      TOTAL TIME TAKING CARE OF THIS PATIENT: 38 minutes.    Note: This dictation was prepared with Dragon dictation along with smaller phrase technology. Any transcriptional errors that result from this process are unintentional.  Vaughan Basta M.D on 04/03/2018 at 3:26 PM  Between 7am to 6pm - Pager - [phone] After 6pm go to [email or web address] - password [email or web address] [facility]  Office  [phone]  CC: Primary care physician; Marguerita Merles, MD

## 2018-04-03 NOTE — Clinical Social Work Note (Signed)
Patient is ready for discharge. CSW contacted [facility] this morning and the [facility] is still pending. CSW spoke with patient and stated that if an auth was not received by this afternoon, she would need to return home with home health in place. Patient was in agreement with this plan. Of note, patient refused to ambulate with PT yesterday.  Shela Leff MSW,LCSW [phone]

## 2018-04-03 NOTE — Progress Notes (Signed)
Pt discharged to [facility] via EMS, no complaints at this time, family/friend contacted and updated. Pt VSS, followup appointments made, report called to RN at SNF.

## 2018-04-03 NOTE — Care Management Important Message (Signed)
Copy of signed IM left in patient's room.    

## 2018-04-03 NOTE — Plan of Care (Signed)
  Problem: Education: Goal: Knowledge of General Education information will improve Outcome: Progressing   Problem: Safety: Goal: Ability to remain free from injury will improve Outcome: Progressing   Problem: Clinical Measurements: Goal: Diagnostic test results will improve Outcome: Progressing Goal: Signs and symptoms of infection will decrease Outcome: Progressing

## 2018-04-03 NOTE — Discharge Instructions (Signed)
Follow with urology clinic in 1 week.

## 2018-04-06 DIAGNOSIS — L03119 Cellulitis of unspecified part of limb: Secondary | ICD-10-CM | POA: Insufficient documentation

## 2018-04-06 DIAGNOSIS — E441 Mild protein-calorie malnutrition: Secondary | ICD-10-CM | POA: Insufficient documentation

## 2018-04-14 ENCOUNTER — Ambulatory Visit: Payer: Self-pay | Admitting: Urology

## 2018-04-22 ENCOUNTER — Ambulatory Visit (INDEPENDENT_AMBULATORY_CARE_PROVIDER_SITE_OTHER): Payer: Medicare HMO | Admitting: Vascular Surgery

## 2018-04-22 ENCOUNTER — Encounter (INDEPENDENT_AMBULATORY_CARE_PROVIDER_SITE_OTHER): Payer: Medicare HMO

## 2018-04-22 ENCOUNTER — Encounter

## 2018-04-22 ENCOUNTER — Other Ambulatory Visit (INDEPENDENT_AMBULATORY_CARE_PROVIDER_SITE_OTHER): Payer: Self-pay | Admitting: Vascular Surgery

## 2018-04-22 DIAGNOSIS — M79606 Pain in leg, unspecified: Secondary | ICD-10-CM

## 2018-04-27 ENCOUNTER — Ambulatory Visit: Payer: Self-pay

## 2018-05-05 ENCOUNTER — Ambulatory Visit: Payer: Self-pay | Admitting: Urology

## 2018-05-08 ENCOUNTER — Emergency Department
Admission: EM | Admit: 2018-05-08 | Discharge: 2018-05-08 | Disposition: A | Discharge status: Home / Self Care | Payer: Medicare HMO | Attending: Emergency Medicine | Admitting: Emergency Medicine

## 2018-05-08 ENCOUNTER — Other Ambulatory Visit: Payer: Self-pay

## 2018-05-08 ENCOUNTER — Encounter: Payer: Self-pay | Admitting: *Deleted

## 2018-05-08 DIAGNOSIS — T839XXA Unspecified complication of genitourinary prosthetic device, implant and graft, initial encounter: Secondary | ICD-10-CM | POA: Insufficient documentation

## 2018-05-08 DIAGNOSIS — E119 Type 2 diabetes mellitus without complications: Secondary | ICD-10-CM | POA: Insufficient documentation

## 2018-05-08 DIAGNOSIS — Z7984 Long term (current) use of oral hypoglycemic drugs: Secondary | ICD-10-CM | POA: Insufficient documentation

## 2018-05-08 DIAGNOSIS — Z87891 Personal history of nicotine dependence: Secondary | ICD-10-CM | POA: Diagnosis not present

## 2018-05-08 DIAGNOSIS — Y69 Unspecified misadventure during surgical and medical care: Secondary | ICD-10-CM | POA: Diagnosis not present

## 2018-05-08 DIAGNOSIS — Z79899 Other long term (current) drug therapy: Secondary | ICD-10-CM | POA: Diagnosis not present

## 2018-05-08 NOTE — ED Triage Notes (Signed)
Pt in via EMS, reports coming from the [facility] and needs her foley catheter bag changed because her current one is leaking. EMS reports pt denies all complaints just needs her bag changed out.

## 2018-05-08 NOTE — ED Provider Notes (Signed)
[facility] Emergency Department Provider Note  ____________________________________________  Time seen: Approximately 4:36 PM  I have reviewed the triage vital signs and the nursing notes.   HISTORY  Chief Complaint Foley bag leaking   HPI [name] is a 67 y.o. female who presents to the emergency department for foley bag change. She denies other complaints today.   Past Medical History:  Diagnosis Date  . Diabetes mellitus without complication ([place])   . HLD (hyperlipidemia)     Patient Active Problem List   Diagnosis Date Noted  . Pressure injury of skin 03/27/2018  . Sepsis ([place]) 03/26/2018  . Diabetic infection of left foot ([place]) 03/26/2018  . AKI (acute kidney injury) ([place]) 03/26/2018  . Venous ulcer of left leg ([place]) 11/18/2016  . Chronic venous insufficiency 11/18/2016  . Lymphedema 11/18/2016  . Swelling of limb 11/18/2016  . Type 2 diabetes mellitus with insulin therapy ([place]) 11/18/2016  . Hyperlipidemia 11/18/2016    Past Surgical History:  Procedure Laterality Date  . APPENDECTOMY    . TONSILLECTOMY      Prior to Admission medications   Medication Sig Start Date End Date Taking? Authorizing Provider  dronabinol (MARINOL) 2.5 MG capsule Take 1 capsule (2.5 mg total) by mouth 2 (two) times daily before lunch and supper. 04/02/18   Bettey Costa, MD  glipiZIDE (GLUCOTROL) 5 MG tablet Take by mouth daily before breakfast.    [provider]  levothyroxine (SYNTHROID, LEVOTHROID) 125 MCG tablet Take 1 tablet (125 mcg total) by mouth daily before breakfast. 04/03/18   Bettey Costa, MD  tamsulosin (FLOMAX) 0.4 MG CAPS capsule Take 1 capsule (0.4 mg total) by mouth daily. 04/03/18   Bettey Costa, MD    Allergies Penicillins  Family History  Problem Relation Age of Onset  . Diabetes Mother   . Diabetes Father   . Breast cancer Neg Hx     Social History Social History   Tobacco Use  . Smoking status: Former Research scientist (life sciences)  .  Smokeless tobacco: Never Used  Substance Use Topics  . Alcohol use: No  . Drug use: No    Review of Systems Constitutional: Negative for fever. Respiratory: Negative for cough Gastrointestinal: No abdominal pain.  No nausea, no vomiting. Neurological: Negative for headaches, focal weakness or numbness.  ____________________________________________   PHYSICAL EXAM:  VITAL SIGNS: ED Triage Vitals  Enc Vitals Group     BP 05/08/18 1628 (!) 151/92     Pulse Rate 05/08/18 1628 73     Resp 05/08/18 1628 16     Temp 05/08/18 1628 97.9 F (36.6 C)     Temp Source 05/08/18 1628 Oral     SpO2 05/08/18 1628 95 %     Weight 05/08/18 1626 170 lb (77.1 kg)     Height 05/08/18 1626 5\' 4"  (1.626 m)     Head Circumference --      Peak Flow --      Pain Score 05/08/18 1626 0     Pain Loc --      Pain Edu? --      Excl. in [place]? --     Constitutional: Alert and oriented. Chronically ill appearing and in no acute distress. Eyes: Conjunctivae are normal. Head: Atraumatic. Mouth/Throat: Mucous membranes are moist. Neck: No stridor.  Respiratory: Normal respiratory effort. Neurologic:  Normal speech and language. No gross focal neurologic deficits are appreciated. Speech is normal.  Psychiatric: Mood and affect are normal. Speech and behavior are normal.  ____________________________________________   LABS (all labs ordered are listed, but only abnormal results are displayed)  Labs Reviewed - No data to display ____________________________________________  EKG  Not indicated. ____________________________________________  RADIOLOGY  Not indicated. ____________________________________________   PROCEDURES  None ____________________________________________   INITIAL IMPRESSION / ASSESSMENT AND PLAN / ED COURSE  67 year old female presenting to the emergency department for replacement of her Foley bag.  Bag was replaced in triage.  Catheter is draining.  Urine appears  clear and straw-colored.  No abdominal pain or feeling of retention per the patient.  She will be discharged home and was advised to return to the emergency department for any symptom of concern if unable to schedule an appointment with primary care.  Pertinent labs & imaging results that were available during my care of the patient were reviewed by me and considered in my medical decision making (see chart for details). ____________________________________________   FINAL CLINICAL IMPRESSION(S) / ED DIAGNOSES  Final diagnoses:  Problem with Foley catheter, initial encounter [facility])       Victorino Dike, FNP 05/08/18 1705    Harvest Dark, MD 05/08/18 2248

## 2018-05-08 NOTE — ED Notes (Signed)
Pt given sandwich tray per PA request.

## 2018-05-08 NOTE — ED Notes (Addendum)
Pt request that we call ems for transportation.  1735: [facility] and got a automated system that stated that everyone was assisting other clients. No option to leave message.  1740: per the [facility], send by ems, no one to transport.

## 2018-05-08 NOTE — ED Notes (Signed)
Foley bag has been changed by triage RN. No leaking noted at this time.

## 2018-05-08 NOTE — ED Notes (Signed)
Pt continues to wait for EMS to pick up.

## 2018-05-08 NOTE — ED Notes (Signed)
Called the [facility], report given to nurse Margreta Journey, and informed facility that patient was in route to their facility

## 2018-06-07 NOTE — Progress Notes (Signed)
06/08/2018 11:30 AM   [name] Mar 17, 1951 [id number]  Referring provider: Marguerita Merles, [place] [address] [place], [place] [address]  Chief Complaint  Patient presents with  . Urinary Retention    HPI: Patient is a 67 year old female with a history of uncontrolled diabetes, lymphadenitis and was recently admitted for cellulitis and was found to have bilateral hydronephrosis and urinary retention and Foley catheter was placed with her transportation specialist, [place].    Her Foley was placed on March 30, 2018 after a renal ultrasound was performed due to increasing serum creatinine levels.  PVR was > 1500cc.  RUS on 03/30/2018 noted mild right-sided and moderate left-sided hydronephrosis.  She was initiated on tamsulosin 0.4 mg daily.    Today, Foley catheter is exchanged.   She denies any difficulty with urination prior to her hospital admission.  Patient denies any gross hematuria, dysuria or suprapubic/flank pain.  Patient denies any fevers, chills, nausea or vomiting.     Reviewed hospital admission notes See above    PMH: Past Medical History:  Diagnosis Date  . Asthma   . Diabetes mellitus without complication ([place])   . Glaucoma   . HLD (hyperlipidemia)     Surgical History: Past Surgical History:  Procedure Laterality Date  . APPENDECTOMY    . TONSILLECTOMY      Home Medications:  Allergies as of 06/08/2018      Reactions   Penicillins Swelling   Has patient had a PCN reaction causing immediate rash, facial/tongue/throat swelling, SOB or lightheadedness with hypotension: Unknown Has patient had a PCN reaction causing severe rash involving mucus membranes or skin necrosis: Unknown Has patient had a PCN reaction that required hospitalization: Unknown Has patient had a PCN reaction occurring within the last 10 years: Unknown If all of the above answers are "NO", then may proceed with Cephalosporin use.      Medication List        Accurate as of  06/08/18 11:30 AM. Always use your most recent med list.          acetaminophen 325 MG tablet Commonly known as:  TYLENOL Take 650 mg by mouth every 6 (six) hours as needed.   D3-50 50000 units capsule Generic drug:  Cholecalciferol Take 50,000 Units by mouth once a week.   dronabinol 2.5 MG capsule Commonly known as:  MARINOL Take 1 capsule (2.5 mg total) by mouth 2 (two) times daily before lunch and supper.   ferrous sulfate 325 (65 FE) MG tablet Take 325 mg by mouth daily with breakfast.   gabapentin 100 MG capsule Commonly known as:  NEURONTIN Take 200 mg by mouth 3 (three) times daily.   glipiZIDE 5 MG tablet Commonly known as:  GLUCOTROL Take by mouth daily before breakfast.   levothyroxine 125 MCG tablet Commonly known as:  SYNTHROID, LEVOTHROID Take 1 tablet (125 mcg total) by mouth daily before breakfast.   NOVOLIN 70/30 (70-30) 100 UNIT/ML injection Generic drug:  insulin NPH-regular Human Inject into the skin.   tamsulosin 0.4 MG Caps capsule Commonly known as:  FLOMAX Take 1 capsule (0.4 mg total) by mouth daily.       Allergies:  Allergies  Allergen Reactions  . Penicillins Swelling    Has patient had a PCN reaction causing immediate rash, facial/tongue/throat swelling, SOB or lightheadedness with hypotension: Unknown Has patient had a PCN reaction causing severe rash involving mucus membranes or skin necrosis: Unknown Has patient had a PCN reaction that required  hospitalization: Unknown Has patient had a PCN reaction occurring within the last 10 years: Unknown If all of the above answers are "NO", then may proceed with Cephalosporin use.     Family History: Family History  Problem Relation Age of Onset  . Diabetes Mother   . Diabetes Father   . Breast cancer Neg Hx     Social History:  reports that she has quit smoking. She has never used smokeless tobacco. She reports that she does not drink alcohol or use drugs.  ROS: UROLOGY Frequent  Urination?: No Hard to postpone urination?: No Burning/pain with urination?: No Get up at night to urinate?: No Leakage of urine?: No Urine stream starts and stops?: No Trouble starting stream?: No Do you have to strain to urinate?: No Blood in urine?: No Urinary tract infection?: No Sexually transmitted disease?: No Injury to kidneys or bladder?: No Painful intercourse?: No Weak stream?: No Currently pregnant?: No Vaginal bleeding?: No Last menstrual period?: n  Gastrointestinal Nausea?: No Vomiting?: No Indigestion/heartburn?: No Diarrhea?: No Constipation?: No  Constitutional Fever: No Night sweats?: No Weight loss?: No Fatigue?: No  Skin Skin rash/lesions?: No Itching?: No  Eyes Blurred vision?: No Double vision?: No  Ears/Nose/Throat Sore throat?: No Sinus problems?: No  Hematologic/Lymphatic Swollen glands?: No Easy bruising?: No  Cardiovascular Leg swelling?: Yes Chest pain?: No  Respiratory Cough?: No Shortness of breath?: No  Endocrine Excessive thirst?: No  Musculoskeletal Back pain?: No Joint pain?: No  Neurological Headaches?: No Dizziness?: No  Psychologic Depression?: No Anxiety?: No  Physical Exam: BP 101/66   Pulse (!) 101   Wt 182 lb (82.6 kg)   LMP  (LMP Unknown)   BMI 31.24 kg/m   Constitutional:  Well nourished. Alert and oriented, No acute distress. HEENT: [place] AT, moist mucus membranes.  Trachea midline, no masses. Cardiovascular: No clubbing, cyanosis, or edema. Respiratory: Normal respiratory effort, no increased work of breathing. GI: Abdomen is soft, non tender, non distended, no abdominal masses. Liver and spleen not palpable.  No hernias appreciated.  Stool sample for occult testing is not indicated.   GU: No CVA tenderness.  No bladder fullness or masses.   Skin: No rashes, bruises or suspicious lesions. Lymph: No cervical or inguinal adenopathy.  Both legs wrapped with dressing.  Left leg more edematous  than the right.   Neurologic: Grossly intact, no focal deficits, moving all 4 extremities. Psychiatric: Normal mood and affect.  Laboratory Data: Lab Results  Component Value Date   WBC 9.0 04/03/2018   HGB 8.8 (L) 04/03/2018   HCT 26.0 (L) 04/03/2018   MCV 85.5 04/03/2018   PLT 381 04/03/2018    Lab Results  Component Value Date   CREATININE 1.25 (H) 04/01/2018    No results found for: PSA  No results found for: TESTOSTERONE  No results found for: HGBA1C  No results found for: TSH  No results found for: CHOL, HDL, CHOLHDL, VLDL, LDLCALC  Lab Results  Component Value Date   AST 15 03/06/2013   Lab Results  Component Value Date   ALT 29 03/06/2013   No components found for: ALKALINEPHOPHATASE No components found for: BILIRUBINTOTAL  No results found for: ESTRADIOL  Urinalysis No results found for: COLORURINE, APPEARANCEUR, LABSPEC, PHURINE, GLUCOSEU, HGBUR, BILIRUBINUR, KETONESUR, PROTEINUR, UROBILINOGEN, NITRITE, LEUKOCYTESUR  I have reviewed the labs.   Pertinent Imaging: CLINICAL DATA:  67 year old female with acute renal failure. Initial encounter.  EXAM: RENAL / URINARY TRACT ULTRASOUND COMPLETE  COMPARISON:  None.  FINDINGS: Right Kidney:  Length: 9.8 cm. Echogenicity within normal limits. Mild right hydronephrosis. No mass identified.  Left Kidney:  Length: 12.4 cm. Echogenicity within normal limits. Moderate left hydronephrosis. No mass identified.  Bladder:  Appears normal for degree of bladder distention. Bilateral ureteral jets noted.  IMPRESSION: Mild right-sided and moderate left-sided hydronephrosis.   Electronically Signed   By: Genia Del M.D.   On: 03/30/2018 16:57 I have independently reviewed the films.    Assessment & Plan:    1. Urinary retention Foley catheter exchanged today Continue tamsulosin 0.4 mg daily Unsure what role DM has played as it has been uncontrolled for several months - may need  UDS in the future if TOV is failed   2. Bilateral Hydronephrosis Will repeat RUS to ensure hydronephrosis has resolved with bladder decompression If hydronephrosis has resolved - will schedule TOV   Return for RTC for RUS report; will need to be an am appointment for [place].  These notes generated with voice recognition software. I apologize for typographical errors.  Zara Council, PA-C  [facility] [address]  [place], [place] [address] [phone]

## 2018-06-08 ENCOUNTER — Encounter: Payer: Self-pay | Admitting: Urology

## 2018-06-08 ENCOUNTER — Ambulatory Visit (INDEPENDENT_AMBULATORY_CARE_PROVIDER_SITE_OTHER): Payer: Medicare HMO | Admitting: Urology

## 2018-06-08 ENCOUNTER — Other Ambulatory Visit: Payer: Self-pay

## 2018-06-08 VITALS — BP 101/66 | HR 101 | Wt 182.0 lb

## 2018-06-08 DIAGNOSIS — N1330 Unspecified hydronephrosis: Secondary | ICD-10-CM | POA: Diagnosis not present

## 2018-06-08 DIAGNOSIS — R339 Retention of urine, unspecified: Secondary | ICD-10-CM

## 2018-06-18 ENCOUNTER — Emergency Department
Admission: EM | Admit: 2018-06-18 | Discharge: 2018-06-18 | Disposition: A | Discharge status: Other Acute Inpatient Hospital | Payer: Medicare HMO | Attending: Emergency Medicine | Admitting: Emergency Medicine

## 2018-06-18 ENCOUNTER — Other Ambulatory Visit: Payer: Self-pay

## 2018-06-18 DIAGNOSIS — J45909 Unspecified asthma, uncomplicated: Secondary | ICD-10-CM | POA: Insufficient documentation

## 2018-06-18 DIAGNOSIS — T839XXA Unspecified complication of genitourinary prosthetic device, implant and graft, initial encounter: Secondary | ICD-10-CM | POA: Diagnosis present

## 2018-06-18 DIAGNOSIS — N3090 Cystitis, unspecified without hematuria: Secondary | ICD-10-CM | POA: Diagnosis not present

## 2018-06-18 DIAGNOSIS — Y731 Therapeutic (nonsurgical) and rehabilitative gastroenterology and urology devices associated with adverse incidents: Secondary | ICD-10-CM | POA: Insufficient documentation

## 2018-06-18 DIAGNOSIS — Z87891 Personal history of nicotine dependence: Secondary | ICD-10-CM | POA: Insufficient documentation

## 2018-06-18 DIAGNOSIS — E119 Type 2 diabetes mellitus without complications: Secondary | ICD-10-CM | POA: Insufficient documentation

## 2018-06-18 LAB — URINALYSIS, COMPLETE (UACMP) WITH MICROSCOPIC
BILIRUBIN URINE: NEGATIVE
Glucose, UA: 50 mg/dL — AB
Ketones, ur: NEGATIVE mg/dL
NITRITE: POSITIVE — AB
Protein, ur: NEGATIVE mg/dL
SPECIFIC GRAVITY, URINE: 1.01 (ref 1.005–1.030)
Squamous Epithelial / LPF: NONE SEEN (ref 0–5)
pH: 6 (ref 5.0–8.0)

## 2018-06-18 MED ORDER — CEPHALEXIN 500 MG PO CAPS: 500.0000 mg | ORAL_CAPSULE | Freq: Four times a day (QID) | ORAL | 0 refills | Status: AC

## 2018-06-18 MED ORDER — CEPHALEXIN 500 MG PO CAPS
500.0000 mg | ORAL_CAPSULE | Freq: Once | ORAL | Status: AC
  Administered 2018-06-18: 500 mg via ORAL
  Filled 2018-06-18: qty 1

## 2018-06-18 NOTE — ED Notes (Signed)
[facility] made aware that pt is returning.

## 2018-06-18 NOTE — ED Provider Notes (Signed)
[facility] Emergency Department Provider Note       Time seen: ----------------------------------------- 6:09 PM on 06/18/2018 -----------------------------------------   I have reviewed the triage vital signs and the nursing notes.  HISTORY   Chief Complaint No chief complaint on file.    HPI [name] is a 67 y.o. female with a history of asthma, diabetes, glaucoma, hyperlipidemia who presents to the ED for catheter dysfunction.  Patient states she recently had a Foley catheter placed and it is not working correctly.  She is having leakage around the Foley catheter and she feels like it may have come out some.  She denies any pain, fevers, chills or other complaints.  Past Medical History:  Diagnosis Date  . Asthma   . Diabetes mellitus without complication ([place])   . Glaucoma   . HLD (hyperlipidemia)     Patient Active Problem List   Diagnosis Date Noted  . Pressure injury of skin 03/27/2018  . Sepsis ([place]) 03/26/2018  . Diabetic infection of left foot ([place]) 03/26/2018  . AKI (acute kidney injury) ([place]) 03/26/2018  . Venous ulcer of left leg ([place]) 11/18/2016  . Chronic venous insufficiency 11/18/2016  . Lymphedema 11/18/2016  . Swelling of limb 11/18/2016  . Type 2 diabetes mellitus with insulin therapy ([place]) 11/18/2016  . Hyperlipidemia 11/18/2016    Past Surgical History:  Procedure Laterality Date  . APPENDECTOMY    . TONSILLECTOMY      Allergies Penicillins  Social History Social History   Tobacco Use  . Smoking status: Former Research scientist (life sciences)  . Smokeless tobacco: Never Used  Substance Use Topics  . Alcohol use: No  . Drug use: No   Review of Systems Constitutional: Negative for fever. Cardiovascular: Negative for chest pain. Respiratory: Negative for shortness of breath. Gastrointestinal: Negative for abdominal pain, vomiting and diarrhea. Genitourinary: Positive for Foley catheter dysfunction Musculoskeletal: Negative  for back pain. Skin: Negative for rash. Neurological: Negative for headaches, focal weakness or numbness.  All systems negative/normal/unremarkable except as stated in the HPI  ____________________________________________   PHYSICAL EXAM:  VITAL SIGNS: ED Triage Vitals  Enc Vitals Group     BP      Pulse      Resp      Temp      Temp src      SpO2      Weight      Height      Head Circumference      Peak Flow      Pain Score      Pain Loc      Pain Edu?      Excl. in [place]?    Constitutional: Alert and oriented. Well appearing and in no distress. Eyes: Conjunctivae are normal. Normal extraocular movements. Cardiovascular: Normal rate, regular rhythm. No murmurs, rubs, or gallops. Respiratory: Normal respiratory effort without tachypnea nor retractions. Breath sounds are clear and equal bilaterally. No wheezes/rales/rhonchi. Gastrointestinal: Soft and nontender. Normal bowel sounds Genitourinary: There is currently urine in her Foley catheter and it appears to be working properly Musculoskeletal: Nontender with normal range of motion in extremities. No lower extremity tenderness nor edema. Neurologic:  Normal speech and language. No gross focal neurologic deficits are appreciated.  Skin:  Skin is warm, dry and intact. No rash noted. Psychiatric: Mood and affect are normal. Speech and behavior are normal.  ____________________________________________  ED COURSE:  As part of my medical decision making, I reviewed the following data within the electronic medical  record:  History obtained from family if available, nursing notes, old chart and ekg, as well as notes from prior ED visits. Patient presented for potential Foley catheter dysfunction, we will place a larger Foley catheter and send a urinalysis.   Procedures ____________________________________________   LABS (pertinent positives/negatives)  Labs Reviewed  URINALYSIS, COMPLETE (UACMP) WITH MICROSCOPIC - Abnormal;  Notable for the following components:      Result Value   Color, Urine YELLOW (*)    APPearance CLEAR (*)    Glucose, UA 50 (*)    Hgb urine dipstick SMALL (*)    Nitrite POSITIVE (*)    Leukocytes, UA SMALL (*)    Bacteria, UA FEW (*)    All other components within normal limits  URINE CULTURE  ____________________________________________  DIFFERENTIAL DIAGNOSIS   Foley catheter dysfunction, UTI, urinary retention unlikely  FINAL ASSESSMENT AND PLAN  Foley catheter dysfunction, cystitis   Plan: The patient had presented for Foley catheter dysfunction. Patient's labs do indicate a urinary tract infection.  We replaced the Foley catheter and sent a urine culture.  She was given her first dose of antibiotics here and will be given a prescription for Keflex.   Laurence Aly, MD   Note: This note was generated in part or whole with voice recognition software. Voice recognition is usually quite accurate but there are transcription errors that can and very often do occur. I apologize for any typographical errors that were not detected and corrected.     Earleen Newport, MD 06/18/18 [phone]

## 2018-06-18 NOTE — ED Triage Notes (Signed)
Pt arrived via ems with foley catheter displacement.Foley catheter that was placed yesterday is leaking and needs to be replaced. Pt NAD, respirations even and non labored.

## 2018-06-20 LAB — URINE CULTURE: SPECIAL REQUESTS: NORMAL

## 2018-06-22 ENCOUNTER — Encounter: Payer: Medicare Other | Attending: Physician Assistant | Admitting: Physician Assistant

## 2018-06-22 DIAGNOSIS — I10 Essential (primary) hypertension: Secondary | ICD-10-CM | POA: Insufficient documentation

## 2018-06-22 DIAGNOSIS — I890 Lymphedema, not elsewhere classified: Secondary | ICD-10-CM | POA: Diagnosis not present

## 2018-06-22 DIAGNOSIS — J45909 Unspecified asthma, uncomplicated: Secondary | ICD-10-CM | POA: Diagnosis not present

## 2018-06-22 DIAGNOSIS — E1142 Type 2 diabetes mellitus with diabetic polyneuropathy: Secondary | ICD-10-CM | POA: Diagnosis not present

## 2018-06-22 DIAGNOSIS — Z794 Long term (current) use of insulin: Secondary | ICD-10-CM | POA: Insufficient documentation

## 2018-06-22 DIAGNOSIS — Z8249 Family history of ischemic heart disease and other diseases of the circulatory system: Secondary | ICD-10-CM | POA: Insufficient documentation

## 2018-06-23 ENCOUNTER — Other Ambulatory Visit: Payer: Self-pay | Admitting: Urology

## 2018-06-23 ENCOUNTER — Ambulatory Visit
Admission: RE | Admit: 2018-06-23 | Discharge: 2018-06-23 | Disposition: A | Discharge status: Home / Self Care | Payer: Medicare Other | Source: Ambulatory Visit | Attending: Urology | Admitting: Urology

## 2018-06-23 DIAGNOSIS — N1330 Unspecified hydronephrosis: Secondary | ICD-10-CM | POA: Diagnosis present

## 2018-06-23 DIAGNOSIS — R3129 Other microscopic hematuria: Secondary | ICD-10-CM

## 2018-06-23 DIAGNOSIS — Z960 Presence of urogenital implants: Secondary | ICD-10-CM | POA: Diagnosis not present

## 2018-06-23 DIAGNOSIS — N3289 Other specified disorders of bladder: Secondary | ICD-10-CM | POA: Insufficient documentation

## 2018-06-23 NOTE — Progress Notes (Signed)
CTU order in place.

## 2018-06-23 NOTE — Progress Notes (Signed)
LIS, SAVITT ([id number]) Visit Report for 06/22/2018 Abuse/Suicide Risk Screen Details Patient Name: [name], [name] Date of Service: 06/22/2018 12:30 PM Medical Record Number: [id number] Patient Account Number: [id number] Date of Birth/Sex: October 18, 1951 (67 y.o. F) Treating RN: Cornell Barman Primary Care Brondon Wann: SYSTEM, PCP Other Clinician: Referring Jacquelyne Quarry: Irene Limbo Treating Lorynn Moeser/Extender: STONE III, HOYT Weeks in Treatment: 0 Abuse/Suicide Risk Screen Items Answer ABUSE/SUICIDE RISK SCREEN: Has anyone close to you tried to hurt or harm you recentlyo No Do you feel uncomfortable with anyone in your familyo No Has anyone forced you do things that you didnot want to doo No Do you have any thoughts of harming yourselfo No Patient displays signs or symptoms of abuse and/or neglect. No Electronic Signature(s) Signed: 06/22/2018 5:30:39 PM By: Gretta Cool, BSN, RN, CWS, Kim RN, BSN Entered By: Gretta Cool, BSN, RN, CWS, Kim on 06/22/2018 13:03:32 [name] ([id number]) -------------------------------------------------------------------------------- Activities of Daily Living Details Patient Name: [name] Date of Service: 06/22/2018 12:30 PM Medical Record Number: [id number] Patient Account Number: [id number] Date of Birth/Sex: [birth year] (67 y.o. F) Treating RN: Cornell Barman Primary Care Bonham Zingale: SYSTEM, PCP Other Clinician: Referring Lemmie Vanlanen: Irene Limbo Treating Tajuana Kniskern/Extender: STONE III, HOYT Weeks in Treatment: 0 Activities of Daily Living Items Answer Activities of Daily Living (Please select one for each item) Drive Automobile Not Able Take Medications Need Assistance Use Telephone Completely Able Care for Appearance Need Assistance Use Toilet Need Assistance Bath / Shower Need Assistance Dress Self Need Assistance Feed Self Completely Able Walk Completely Able Get In / Out Bed Completely Able Housework Need Assistance Prepare Meals Need  Assistance Handle Money Completely Able Shop for Self Need Assistance Electronic Signature(s) Signed: 06/22/2018 5:30:39 PM By: Gretta Cool, BSN, RN, CWS, Kim RN, BSN Entered By: Gretta Cool, BSN, RN, CWS, Kim on 06/22/2018 13:04:15 TAMLYN, SIDES ([id number]) -------------------------------------------------------------------------------- Education Assessment Details Patient Name: [name] Date of Service: 06/22/2018 12:30 PM Medical Record Number: [id number] Patient Account Number: [id number] Date of Birth/Sex: 06-01-1951 (67 y.o. F) Treating RN: Cornell Barman Primary Care Inna Tisdell: SYSTEM, PCP Other Clinician: Referring Flint Hakeem: Irene Limbo Treating Gloyd Happ/Extender: Melburn Hake, HOYT Weeks in Treatment: 0 Primary Learner Assessed: Patient Learning Preferences/Education Level/Primary Language Learning Preference: Explanation, Demonstration Highest Education Level: College or Above Preferred Language: English Cognitive Barrier Assessment/Beliefs Language Barrier: No Translator Needed: No Memory Deficit: No Emotional Barrier: No Cultural/Religious Beliefs Affecting Medical Care: No Physical Barrier Assessment Impaired Vision: No Impaired Hearing: No Decreased Hand dexterity: No Knowledge/Comprehension Assessment Knowledge Level: High Comprehension Level: High Ability to understand written High instructions: Ability to understand verbal High instructions: Motivation Assessment Anxiety Level: Calm Cooperation: Cooperative Education Importance: Acknowledges Need Interest in Health Problems: Asks Questions Perception: Coherent Willingness to Engage in Self- Medium Management Activities: Readiness to Engage in Self- Medium Management Activities: Electronic Signature(s) Signed: 06/22/2018 5:30:39 PM By: Gretta Cool, BSN, RN, CWS, Kim RN, BSN Entered By: Gretta Cool, BSN, RN, CWS, Kim on 06/22/2018 13:04:44 JOELLE, ROSWELL  ([id number]) -------------------------------------------------------------------------------- Fall Risk Assessment Details Patient Name: [name] Date of Service: 06/22/2018 12:30 PM Medical Record Number: [id number] Patient Account Number: [id number] Date of Birth/Sex: 30-Aug-1951 (67 y.o. F) Treating RN: Cornell Barman Primary Care Cable Fearn: SYSTEM, PCP Other Clinician: Referring Naelle Diegel: Irene Limbo Treating Roquel Burgin/Extender: Melburn Hake, HOYT Weeks in Treatment: 0 Fall Risk Assessment Items Have you had 2 or more falls in the last 12 monthso 0 No Have you had any fall that resulted in injury in the last 12 monthso 0 No FALL RISK ASSESSMENT: History  of falling - immediate or within 3 months 0 No Secondary diagnosis 0 No Ambulatory aid None/bed rest/wheelchair/nurse 0 No Crutches/cane/walker 15 Yes Furniture 0 No IV Access/Saline Lock 0 No Gait/Training Normal/bed rest/immobile 0 No Weak 10 Yes Impaired 0 No Mental Status Oriented to own ability 0 No Electronic Signature(s) Signed: 06/22/2018 5:30:39 PM By: Gretta Cool, BSN, RN, CWS, Kim RN, BSN Entered By: Gretta Cool, BSN, RN, CWS, Kim on 06/22/2018 13:05:13 NAOMEE, NOWLAND ([id number]) -------------------------------------------------------------------------------- Foot Assessment Details Patient Name: [name] Date of Service: 06/22/2018 12:30 PM Medical Record Number: [id number] Patient Account Number: [id number] Date of Birth/Sex: 1951/09/27 (67 y.o. F) Treating RN: Cornell Barman Primary Care Kashmere Staffa: SYSTEM, PCP Other Clinician: Referring Shamekia Tippets: Irene Limbo Treating Tanesia Butner/Extender: STONE III, HOYT Weeks in Treatment: 0 Foot Assessment Items Site Locations + = Sensation present, - = Sensation absent, C = Callus, U = Ulcer R = Redness, W = Warmth, M = Maceration, PU = Pre-ulcerative lesion F = Fissure, S = Swelling, D = Dryness Assessment Right: Left: Other Deformity: No No Prior Foot Ulcer: No No Prior  Amputation: No No Charcot Joint: No No Ambulatory Status: Ambulatory With Help Assistance Device: Walker GaitEnergy manager) Signed: 06/22/2018 5:30:39 PM By: Gretta Cool, BSN, RN, CWS, Kim RN, BSN Entered By: Gretta Cool, BSN, RN, CWS, Kim on 06/22/2018 13:08:43 DANILYN, COCKE ([id number]) -------------------------------------------------------------------------------- Nutrition Risk Assessment Details Patient Name: [name] Date of Service: 06/22/2018 12:30 PM Medical Record Number: [id number] Patient Account Number: [id number] Date of Birth/Sex: 15-Dec-1951 (67 y.o. F) Treating RN: Cornell Barman Primary Care Ramya Vanbergen: SYSTEM, PCP Other Clinician: Referring Coreen Shippee: Irene Limbo Treating Davian Hanshaw/Extender: STONE III, HOYT Weeks in Treatment: 0 Height (in): 64 Weight (lbs): Body Mass Index (BMI): Nutrition Risk Assessment Items NUTRITION RISK SCREEN: I have an illness or condition that made me change the kind and/or amount of 0 No food I eat I eat fewer than two meals per day 0 No I eat few fruits and vegetables, or milk products 0 No I have three or more drinks of beer, liquor or wine almost every day 0 No I have tooth or mouth problems that make it hard for me to eat 0 No I don't always have enough money to buy the food I need 0 No I eat alone most of the time 0 No I take three or more different prescribed or over-the-counter drugs a day 1 Yes Without wanting to, I have lost or gained 10 pounds in the last six months 0 No I am not always physically able to shop, cook and/or feed myself 0 No Nutrition Protocols Good Risk Protocol 0 No interventions needed Moderate Risk Protocol Electronic Signature(s) Signed: 06/22/2018 5:30:39 PM By: Gretta Cool, BSN, RN, CWS, Kim RN, BSN Entered By: Gretta Cool, BSN, RN, CWS, Kim on 06/22/2018 13:05:39

## 2018-06-24 ENCOUNTER — Telehealth: Payer: Self-pay

## 2018-06-24 DIAGNOSIS — N1330 Unspecified hydronephrosis: Secondary | ICD-10-CM

## 2018-06-24 DIAGNOSIS — R3129 Other microscopic hematuria: Secondary | ICD-10-CM

## 2018-06-24 DIAGNOSIS — R339 Retention of urine, unspecified: Secondary | ICD-10-CM

## 2018-06-24 NOTE — Telephone Encounter (Signed)
Called pt's living facility, spoke with nurse. Informed him of results.

## 2018-06-24 NOTE — Telephone Encounter (Signed)
-----   Message from Nori Riis, PA-C sent at 06/23/2018  3:09 PM EDT ----- Please let Mrs. 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  We need to do a CTU to see what is causing the obstruction.

## 2018-06-25 NOTE — Progress Notes (Signed)
[name], [name] ([id number]) Visit Report for 06/22/2018 Chief Complaint Document Details Patient Name: [name], [name] Date of Service: 06/22/2018 12:30 PM Medical Record Number: [id number] Patient Account Number: [id number] Date of Birth/Sex: 11-Aug-1951 (66 y.o. F) Treating RN: Roger Shelter Primary Care Provider: SYSTEM, PCP Other Clinician: Referring Provider: Irene Limbo Treating Provider/Extender: Melburn Hake, Grahm Etsitty Weeks in Treatment: 0 Information Obtained from: Patient Chief Complaint Bilateral LE Lymphedema Electronic Signature(s) Signed: 06/23/2018 8:11:30 AM By: Worthy Keeler PA-C Entered By: Worthy Keeler on 06/22/2018 17:49:24 [name], [name] ([id number]) -------------------------------------------------------------------------------- HPI Details Patient Name: [name] Date of Service: 06/22/2018 12:30 PM Medical Record Number: [id number] Patient Account Number: [id number] Date of Birth/Sex: 1951/01/26 (66 y.o. 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 Fortunately she does not have any active open wounds at this point in time. Unfortunately this has apparently been a scenario that comes and goes quite frequently due to the lymphedema that she's had present for quite a number of years. She does have some difficulty apparently wearing standard compression stockings and may need custom compression in order to correctly manage this. She does have lymphedema pumps which are at home in her apartment although she no longer lives there and will not be going back there she is currently residing in assisted  living. Nonetheless she has not been able to use her pumps for quite some time as she has not been able to apparently accept them due to a very complicated scenario that I do not fully understand. Nonetheless she states that she's gonna have to write some kind a letter to the owner of the property till our friend to come in and remove her belonging and get them to her. I believe she needs to do this sooner rather than later to get her compression pumps. Nonetheless otherwise she seems to be doing fairly well all things considered and she did have Unna Boot wraps up on evaluation today. Electronic Signature(s) Signed: 06/23/2018 8:11:30 AM By: Worthy Keeler PA-C Entered By: Worthy Keeler on 06/23/2018 08:09:39 [name], [name] ([id number]) -------------------------------------------------------------------------------- Physical Exam Details Patient Name: [name] Date of Service: 06/22/2018 12:30 PM Medical Record Number: [id number] Patient Account Number: [id number] Date of Birth/Sex: 1951/09/13 (66 y.o. F) Treating RN: Roger Shelter Primary Care Provider: SYSTEM, PCP Other Clinician: Referring Provider: Irene Limbo Treating Provider/Extender: STONE III, Haliey Romberg Weeks in Treatment: 0 Constitutional sitting or standing blood pressure is within target range for patient.. pulse regular and within target range for patient.Marland Kitchen respirations regular, non-labored and within target range for patient.Marland Kitchen temperature within target range for patient.. Well- nourished and well-hydrated in no acute distress. Eyes conjunctiva clear no eyelid edema noted. pupils equal round and reactive to light and accommodation. Ears, Nose, Mouth, and Throat no gross abnormality of ear auricles or external auditory canals. normal hearing noted during conversation. mucus membranes moist. Respiratory normal breathing without difficulty. clear to auscultation bilaterally. Cardiovascular regular rate and rhythm  with normal S1, S2. 1+ dorsalis pedis/posterior tibialis pulses. Bilateral stage 3 lymphedema. Gastrointestinal (GI) soft, non-tender, non-distended, +BS. no ventral hernia noted. Musculoskeletal normal gait and posture. no significant deformity or arthritic changes, no loss or range of motion, no clubbing. Psychiatric  this patient is able to make decisions and demonstrates good insight into disease process. Alert and Oriented x 3. pleasant and cooperative. Notes Again patient has no open wounds but does have bilateral stage III lymphedema which has been controlled with the compression wraps that is good news. Unfortunately she does not have access to her lymphedema pumps at this point. Electronic Signature(s) Signed: 06/23/2018 8:11:30 AM By: Worthy Keeler PA-C Entered By: Worthy Keeler on 06/23/2018 08:10:39 [name], [name] ([id number]) -------------------------------------------------------------------------------- Physician Orders Details Patient Name: [name] Date of Service: 06/22/2018 12:30 PM Medical Record Number: [id number] Patient Account Number: [id number] Date of Birth/Sex: 30-Aug-1951 (66 y.o. F) Treating RN: Roger Shelter Primary Care Provider: SYSTEM, PCP Other Clinician: Referring Provider: Irene Limbo Treating Provider/Extender: STONE III, Jonty Morrical Weeks in Treatment: 0 Verbal / Phone Orders: No Diagnosis Coding Wound Cleansing o Clean wound with Normal Saline. Skin Barriers/Peri-Wound Care o Moisturizing lotion Dressing Change Frequency o Dressing is to be changed Monday and Thursday. Follow-up Appointments o Return Appointment in 1 week. o Nurse Visit as needed Edema Control o 3 Layer Compression System - Bilateral - unna boot to anchor, wrap around foot once and then wrap wound lower leg approximately 2 to 3 finger widths below the knee. this is to anchor dressings from sliding. wraps need to be 2 finger widths below the toes up to 2  to 3 finger widths below knee o Compression Pump: Use compression pump on left lower extremity for 30 minutes, twice daily. - use one hour twice daily o Compression Pump: Use compression pump on right lower extremity for 30 minutes, twice daily. - use one hour twice daily Off-Loading o Turn and reposition every 2 hours o Other: - elevate lower extremities to assist with edema control in lower legs Additional Orders / Instructions o Vitamin A; Vitamin C, Zinc o Increase protein intake. [place] Visits o Home Health Nurse may visit PRN to address patientos wound care needs. o FACE TO FACE ENCOUNTER: MEDICARE and MEDICAID PATIENTS: I certify that this patient is under my care and that I had a face-to-face encounter that meets the physician face-to-face encounter requirements with this patient on this date. The encounter with the patient was in whole or in part for the following MEDICAL CONDITION: (primary reason for [place]) MEDICAL NECESSITY: I certify, that based on my findings, NURSING services are a medically necessary home health service. HOME BOUND STATUS: I certify that my clinical findings support that this patient is homebound (i.e., Due to illness or injury, pt requires aid of supportive devices such as crutches, cane, wheelchairs, walkers, the use of special transportation or the assistance of another person to leave their place of residence. There is a normal inability to leave the home and doing so requires considerable and taxing effort. Other absences are for medical reasons / religious services and are infrequent or of short duration when for other reasons). o If current dressing causes regression in wound condition, may D/C ordered dressing product/s and apply Normal Saline Moist Dressing daily until next [place] / Other MD appointment. [place] of regression in wound condition at  [phone]. o Please direct any NON-WOUND related issues/requests for orders to patient's Primary Care Physician CYANN, VENTI ([id number]) Electronic Signature(s) Signed: 06/23/2018 8:11:30 AM By: Worthy Keeler PA-C Signed: 06/24/2018 3:34:51 PM By: Roger Shelter Entered By: Roger Shelter on 06/22/2018 13:41:44 Hagmann, Garvin Fila ([id number]) -------------------------------------------------------------------------------- Problem List Details Patient  Name: TEARRA, OUK Date of Service: 06/22/2018 12:30 PM Medical Record Number: [id number] Patient Account Number: [id number] Date of Birth/Sex: [birth year] (66 y.o. F) Treating RN: Roger Shelter Primary Care Provider: SYSTEM, PCP Other Clinician: Referring Provider: Irene Limbo Treating Provider/Extender: Melburn Hake, Shakai Dolley Weeks in Treatment: 0 Active Problems ICD-10 Evaluated Encounter Code Description Active Date Today Diagnosis I89.0 Lymphedema, not elsewhere classified 06/22/2018 No Yes E11.42 Type 2 diabetes mellitus with diabetic polyneuropathy 06/22/2018 No Yes I10 Essential (primary) hypertension 06/22/2018 No Yes Inactive Problems Resolved Problems Electronic Signature(s) Signed: 06/23/2018 8:11:30 AM By: Worthy Keeler PA-C Entered By: Worthy Keeler on 06/22/2018 17:49:10 [name] ([id number]) -------------------------------------------------------------------------------- Progress Note Details Patient Name: [name] Date of Service: 06/22/2018 12:30 PM Medical Record Number: [id number] Patient Account Number: [id number] Date of Birth/Sex: April 21, 1951 (66 y.o. 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 Fortunately she does not have any active open wounds at this point in time. Unfortunately this has apparently been a scenario that comes and goes quite frequently due to the lymphedema that she's had present for quite a number of years. She does have some difficulty apparently wearing standard compression stockings and may need custom compression in order to correctly manage this. She does have lymphedema pumps which are at home in her apartment although she no longer lives there and will not be going back there she is currently residing in assisted living. Nonetheless she has not been able to use her pumps for quite some time as she has not been able to apparently accept them due to a very complicated scenario that I do not fully understand. Nonetheless she states that she's gonna have to write some kind a letter to the owner of the property till our friend to come in and remove her belonging and get them to her. I believe she needs to do this sooner rather than later to get her compression pumps. Nonetheless otherwise she seems to be doing fairly well all things considered and she did have Unna Boot wraps up on evaluation today. Wound History Patient reportedly has not had testing performed to evaluate circulation in the legs. Patient History Information obtained from Patient. Allergies penicillin Family History Diabetes - Mother, Heart Disease - Mother, Hypertension - Mother, No family history of Cancer, Kidney Disease, Lung Disease, Seizures, Stroke, Thyroid Problems, Tuberculosis. Social History Never smoker, Marital Status - Single, Alcohol Use - Never, Drug Use - Prior History, Caffeine Use - Daily. Medical History Eyes Patient has history of Cataracts - removed in 2009 Denies history of Glaucoma, Optic  Neuritis Ear/Nose/Mouth/Throat Denies history of Chronic sinus problems/congestion, Middle ear problems Hematologic/Lymphatic Patient has history of Lymphedema - bilateral Denies history of Anemia, Hemophilia, Human Immunodeficiency Virus, Sickle Cell Disease Respiratory Patient has history of Asthma Doxtater, Yolanda K. ([id number]) Denies history of Aspiration, Chronic Obstructive Pulmonary Disease (COPD), Pneumothorax, Sleep Apnea, Tuberculosis Cardiovascular Patient has history of Hypertension, Peripheral Venous Disease Denies history of Angina, Arrhythmia, Congestive Heart Failure, Coronary Artery Disease, Deep Vein Thrombosis, Hypotension, Myocardial Infarction, Peripheral Arterial Disease, Phlebitis, Vasculitis Gastrointestinal Denies history of Cirrhosis , Colitis, Crohn s, Hepatitis A, Hepatitis B, Hepatitis C Endocrine  Patient has history of Type II Diabetes Denies history of Type I Diabetes Genitourinary Denies history of End Stage Renal Disease Immunological Denies history of Lupus Erythematosus, Raynaud s, Scleroderma Integumentary (Skin) Denies history of History of Burn, History of pressure wounds Musculoskeletal Denies history of Gout, Rheumatoid Arthritis, Osteoarthritis, Osteomyelitis Neurologic Patient has history of Neuropathy Denies history of Dementia, Quadriplegia, Paraplegia, Seizure Disorder Oncologic Denies history of Received Chemotherapy, Received Radiation Psychiatric Denies history of Anorexia/bulimia, Confinement Anxiety Patient is treated with Insulin. Blood sugar is not tested. Medical And Surgical History Notes Genitourinary Foley Catheter Review of Systems (ROS) Constitutional Symptoms (General Health) The patient has no complaints or symptoms. Eyes The patient has no complaints or symptoms. Ear/Nose/Mouth/Throat The patient has no complaints or symptoms. Hematologic/Lymphatic The patient has no complaints or symptoms. Respiratory The  patient has no complaints or symptoms. Cardiovascular Complains or has symptoms of LE edema. Denies complaints or symptoms of Chest pain. Gastrointestinal The patient has no complaints or symptoms. Endocrine Complains or has symptoms of Thyroid disease. Denies complaints or symptoms of Hepatitis. Genitourinary The patient has no complaints or symptoms. Immunological The patient has no complaints or symptoms. Integumentary (Skin) Denies complaints or symptoms of Wounds, Bleeding or bruising tendency, Breakdown, Swelling. Musculoskeletal Denies complaints or symptoms of Muscle Pain, Muscle Weakness. Neurologic [name], [name] ([id number]) The patient has no complaints or symptoms. Oncologic The patient has no complaints or symptoms. Psychiatric The patient has no complaints or symptoms. Objective Constitutional sitting or standing blood pressure is within target range for patient.. pulse regular and within target range for patient.Marland Kitchen respirations regular, non-labored and within target range for patient.Marland Kitchen temperature within target range for patient.. Well- nourished and well-hydrated in no acute distress. Vitals Time Taken: 12:46 PM, Height: 64 in, Temperature: 97.9 F, Pulse: 86 bpm, Respiratory Rate: 16 breaths/min, Blood Pressure: 129/56 mmHg. Eyes conjunctiva clear no eyelid edema noted. pupils equal round and reactive to light and accommodation. Ears, Nose, Mouth, and Throat no gross abnormality of ear auricles or external auditory canals. normal hearing noted during conversation. mucus membranes moist. Respiratory normal breathing without difficulty. clear to auscultation bilaterally. Cardiovascular regular rate and rhythm with normal S1, S2. 1+ dorsalis pedis/posterior tibialis pulses. Bilateral stage 3 lymphedema. Gastrointestinal (GI) soft, non-tender, non-distended, +BS. no ventral hernia noted. Musculoskeletal normal gait and posture. no significant deformity or  arthritic changes, no loss or range of motion, no clubbing. Psychiatric this patient is able to make decisions and demonstrates good insight into disease process. Alert and Oriented x 3. pleasant and cooperative. General Notes: Again patient has no open wounds but does have bilateral stage III lymphedema which has been controlled with the compression wraps that is good news. Unfortunately she does not have access to her lymphedema pumps at this point. Other Condition(s) Patient presents with Lymphedema located on the Bilateral Leg. The skin appearance exhibited: Hemosiderin Staining. [name], [name] ([id number]) Assessment Active Problems ICD-10 Lymphedema, not elsewhere classified Type 2 diabetes mellitus with diabetic polyneuropathy Essential (primary) hypertension Plan Wound Cleansing: Clean wound with Normal Saline. Skin Barriers/Peri-Wound Care: Moisturizing lotion Dressing Change Frequency: Dressing is to be changed Monday and Thursday. Follow-up Appointments: Return Appointment in 1 week. Nurse Visit as needed Edema Control: 3 Layer Compression System - Bilateral - unna boot to anchor, wrap around foot once and then wrap wound lower leg approximately 2 to 3 finger widths below the knee. this is to anchor dressings from sliding. wraps need to be 2 finger widths below the toes up  to 2 to 3 finger widths below knee Compression Pump: Use compression pump on left lower extremity for 30 minutes, twice daily. - use one hour twice daily Compression Pump: Use compression pump on right lower extremity for 30 minutes, twice daily. - use one hour twice daily Off-Loading: Turn and reposition every 2 hours Other: - elevate lower extremities to assist with edema control in lower legs Additional Orders / Instructions: Vitamin A; Vitamin C, Zinc Increase protein intake. Home Health: [place] Nurse may visit PRN to address patient s wound care needs. FACE  TO FACE ENCOUNTER: MEDICARE and MEDICAID PATIENTS: I certify that this patient is under my care and that I had a face-to-face encounter that meets the physician face-to-face encounter requirements with this patient on this date. The encounter with the patient was in whole or in part for the following MEDICAL CONDITION: (primary reason for [place]) MEDICAL NECESSITY: I certify, that based on my findings, NURSING services are a medically necessary home health service. HOME BOUND STATUS: I certify that my clinical findings support that this patient is homebound (i.e., Due to illness or injury, pt requires aid of supportive devices such as crutches, cane, wheelchairs, walkers, the use of special transportation or the assistance of another person to leave their place of residence. There is a normal inability to leave the home and doing so requires considerable and taxing effort. Other absences are for medical reasons / religious services and are infrequent or of short duration when for other reasons). If current dressing causes regression in wound condition, may D/C ordered dressing product/s and apply Normal Saline Moist Dressing daily until next [place] / Other MD appointment. [place] of regression in wound condition at [phone]. Please direct any NON-WOUND related issues/requests for orders to patient's Primary Care Physician [name], [name] ([id number]) Yetta Flock suggest currently that we go ahead and initiate the above orders in regard to her lymphedema to try to help improve the overall swelling at this point. Upon next week's evaluation actually get some of the swelling down I would like to eventually proceed with ordering custom compression stockings from elastic therapy in [facility]. I discussed Juxta-Lite wraps but it sounds like this will be too expensive for the patient. Otherwise will see were things stand at that point. Worst case  scenario we may need to refer her to the lymphedema clinic although again I would prefer to try to find a way and manner in which she could manage her lymphedema on her own at home and she is gonna work towards getting her lymphedema pumps back in her possession utilize as soon as possible I think this is appropriate as well. Please see above for specific wound care orders. We will see patient for re-evaluation in 1 week(s) here in the clinic. If anything worsens or changes patient will contact our office for additional recommendations. Electronic Signature(s) Signed: 06/23/2018 8:11:30 AM By: Worthy Keeler PA-C Entered By: Worthy Keeler on 06/23/2018 08:10:55 [name], [name] ([id number]) -------------------------------------------------------------------------------- ROS/PFSH Details Patient Name: [name] Date of Service: 06/22/2018 12:30 PM Medical Record Number: [id number] Patient Account Number: [id number] Date of Birth/Sex: 1951-08-08 (66 y.o. F) Treating RN: Cornell Barman Primary Care Provider: SYSTEM, PCP Other Clinician: Referring Provider: Irene Limbo Treating Provider/Extender: STONE III, Tashi Band Weeks in Treatment: 0 Information Obtained From Patient Wound History Do you currently have one or more open woundso Yes Have you had any tests for  circulation on your legso No Cardiovascular Complaints and Symptoms: Positive for: LE edema Negative for: Chest pain Medical History: Positive for: Hypertension; Peripheral Venous Disease Negative for: Angina; Arrhythmia; Congestive Heart Failure; Coronary Artery Disease; Deep Vein Thrombosis; Hypotension; Myocardial Infarction; Peripheral Arterial Disease; Phlebitis; Vasculitis Endocrine Complaints and Symptoms: Positive for: Thyroid disease Negative for: Hepatitis Medical History: Positive for: Type II Diabetes Negative for: Type I Diabetes Time with diabetes: 10 plus Treated with: Insulin Blood sugar tested every day:  No Integumentary (Skin) Complaints and Symptoms: Negative for: Wounds; Bleeding or bruising tendency; Breakdown; Swelling Medical History: Negative for: History of Burn; History of pressure wounds Musculoskeletal Complaints and Symptoms: Negative for: Muscle Pain; Muscle Weakness Medical History: Negative for: Gout; Rheumatoid Arthritis; Osteoarthritis; Osteomyelitis Constitutional Symptoms (General Health) [name], [name] ([id number]) Complaints and Symptoms: No Complaints or Symptoms Eyes Complaints and Symptoms: No Complaints or Symptoms Medical History: Positive for: Cataracts - removed in 2009 Negative for: Glaucoma; Optic Neuritis Ear/Nose/Mouth/Throat Complaints and Symptoms: No Complaints or Symptoms Medical History: Negative for: Chronic sinus problems/congestion; Middle ear problems Hematologic/Lymphatic Complaints and Symptoms: No Complaints or Symptoms Medical History: Positive for: Lymphedema - bilateral Negative for: Anemia; Hemophilia; Human Immunodeficiency Virus; Sickle Cell Disease Respiratory Complaints and Symptoms: No Complaints or Symptoms Medical History: Positive for: Asthma Negative for: Aspiration; Chronic Obstructive Pulmonary Disease (COPD); Pneumothorax; Sleep Apnea; Tuberculosis Gastrointestinal Complaints and Symptoms: No Complaints or Symptoms Medical History: Negative for: Cirrhosis ; Colitis; Crohnos; Hepatitis A; Hepatitis B; Hepatitis C Genitourinary Complaints and Symptoms: No Complaints or Symptoms Medical History: Negative for: End Stage Renal Disease Past Medical History Notes: Foley Catheter Immunological Complaints and Symptoms: No Complaints or Symptoms [name], ROSASCO. ([id number]) Medical History: Negative for: Lupus Erythematosus; Raynaudos; Scleroderma Neurologic Complaints and Symptoms: No Complaints or Symptoms Medical History: Positive for: Neuropathy Negative for: Dementia; Quadriplegia; Paraplegia; Seizure  Disorder Oncologic Complaints and Symptoms: No Complaints or Symptoms Medical History: Negative for: Received Chemotherapy; Received Radiation Psychiatric Complaints and Symptoms: No Complaints or Symptoms Medical History: Negative for: Anorexia/bulimia; Confinement Anxiety HBO Extended History Items Eyes: Cataracts Immunizations Pneumococcal Vaccine: Received Pneumococcal Vaccination: Yes Implantable Devices Family and Social History Cancer: No; Diabetes: Yes - Mother; Heart Disease: Yes - Mother; Hypertension: Yes - Mother; Kidney Disease: No; Lung Disease: No; Seizures: No; Stroke: No; Thyroid Problems: No; Tuberculosis: No; Never smoker; Marital Status - Single; Alcohol Use: Never; Drug Use: Prior History; Caffeine Use: Daily; Living Will: Yes; Medical Power of Attorney: Yes Electronic Signature(s) Signed: 06/22/2018 5:30:39 PM By: Gretta Cool, BSN, RN, CWS, Kim RN, BSN Signed: 06/23/2018 8:11:30 AM By: Worthy Keeler PA-C Entered By: Gretta Cool BSN, RN, CWS, Kim on 06/22/2018 13:03:25 JUANNA, PUDLO ([id number]) -------------------------------------------------------------------------------- SuperBill Details Patient Name: [name] Date of Service: 06/22/2018 Medical Record Number: [id number] Patient Account Number: [id number] Date of Birth/Sex: [birth year] (66 y.o. F) Treating RN: Roger Shelter Primary Care Provider: SYSTEM, PCP Other Clinician: Referring Provider: Irene Limbo Treating Provider/Extender: STONE III, Hoorain Kozakiewicz Weeks in Treatment: 0 Diagnosis Coding ICD-10 Codes Code Description I89.0 Lymphedema, not elsewhere classified E11.42 Type 2 diabetes mellitus with diabetic polyneuropathy I10 Essential (primary) hypertension Facility Procedures CPT4 Code: 09233007 Description: 99214 - WOUND CARE VISIT-LEV 4 EST PT Modifier: Quantity: 1 Physician Procedures CPT4 Code: 6226333 Description: WC PHYS LEVEL 3 o NEW PT ICD-10 Diagnosis Description I89.0 Lymphedema,  not elsewhere classified E11.42 Type 2 diabetes mellitus with diabetic polyneuropathy I10 Essential (primary) hypertension Modifier: Quantity: 1 Electronic Signature(s) Signed: 06/23/2018 8:11:30 AM By: Worthy Keeler PA-C Entered By:  Worthy Keeler on 06/23/2018 08:11:14

## 2018-06-25 NOTE — Progress Notes (Signed)
[name], [name] ([id number]) Visit Report for 06/22/2018 Allergy List Details Patient Name: [name], [name] Date of Service: 06/22/2018 12:30 PM Medical Record Number: [id number] Patient Account Number: [id number] Date of Birth/Sex: 1951/12/21 (66 y.o. F) Treating RN: Cornell Barman Primary Care Tamsen Reist: SYSTEM, PCP Other Clinician: Referring Shykeria Sakamoto: Irene Limbo Treating Idrissa Beville/Extender: Melburn Hake, HOYT Weeks in Treatment: 0 Allergies Active Allergies penicillin Allergy Notes Electronic Signature(s) Signed: 06/22/2018 5:30:39 PM By: Gretta Cool, BSN, RN, CWS, Kim RN, BSN Entered By: Gretta Cool, BSN, RN, CWS, Kim on 06/22/2018 12:48:01 [name], [name] ([id number]) -------------------------------------------------------------------------------- Arrival Information Details Patient Name: [name] Date of Service: 06/22/2018 12:30 PM Medical Record Number: [id number] Patient Account Number: [id number] Date of Birth/Sex: 1951-01-16 (66 y.o. F) Treating RN: Cornell Barman Primary Care Ebunoluwa Gernert: SYSTEM, PCP Other Clinician: Referring Omri Bertran: Irene Limbo Treating Kherington Meraz/Extender: Melburn Hake, HOYT Weeks in Treatment: 0 Visit Information Patient Arrived: Walker Arrival Time: 12:45 Accompanied By: transportation Transfer Assistance: None Patient Identification Verified: Yes Secondary Verification Process Completed: Yes Patient Has Alerts: Yes Patient Alerts: Type II Diabetic Electronic Signature(s) Signed: 06/22/2018 5:30:39 PM By: Gretta Cool, BSN, RN, CWS, Kim RN, BSN Entered By: Gretta Cool, BSN, RN, CWS, Kim on 06/22/2018 12:46:46 [name] ([id number]) -------------------------------------------------------------------------------- Clinic Level of Care Assessment Details Patient Name: [name] Date of Service: 06/22/2018 12:30 PM Medical Record Number: [id number] Patient Account Number: [id number] Date of Birth/Sex: 10/09/1951 (66 y.o. F) Treating RN: Roger Shelter Primary Care  Averi Cacioppo: SYSTEM, PCP Other Clinician: Referring Jhaniya Briski: Irene Limbo Treating Palmer Fahrner/Extender: STONE III, HOYT Weeks in Treatment: 0 Clinic Level of Care Assessment Items TOOL 2 Quantity Score X - Use when only an EandM is performed on the INITIAL visit 1 0 ASSESSMENTS - Nursing Assessment / Reassessment X - General Physical Exam (combine w/ comprehensive assessment (listed just below) when 1 20 performed on new pt. evals) X- 1 25 Comprehensive Assessment (HX, ROS, Risk Assessments, Wounds Hx, etc.) ASSESSMENTS - Wound and Skin Assessment / Reassessment []  - Simple Wound Assessment / Reassessment - one wound 0 []  - 0 Complex Wound Assessment / Reassessment - multiple wounds X- 1 10 Dermatologic / Skin Assessment (not related to wound area) ASSESSMENTS - Ostomy and/or Continence Assessment and Care []  - Incontinence Assessment and Management 0 []  - 0 Ostomy Care Assessment and Management (repouching, etc.) PROCESS - Coordination of Care X - Simple Patient / Family Education for ongoing care 1 15 []  - 0 Complex (extensive) Patient / Family Education for ongoing care []  - 0 Staff obtains Programmer, systems, Records, Test Results / Process Orders []  - 0 Staff telephones HHA, Nursing Homes / Clarify orders / etc []  - 0 Routine Transfer to another Facility (non-emergent condition) []  - 0 Routine Hospital Admission (non-emergent condition) []  - 0 New Admissions / Biomedical engineer / Ordering NPWT, Apligraf, etc. []  - 0 Emergency Hospital Admission (emergent condition) X- 1 10 Simple Discharge Coordination []  - 0 Complex (extensive) Discharge Coordination PROCESS - Special Needs []  - Pediatric / Minor Patient Management 0 []  - 0 Isolation Patient Management [name], HOUPT. ([id number]) []  - 0 Hearing / Language / Visual special needs []  - 0 Assessment of Community assistance (transportation, D/C planning, etc.) []  - 0 Additional assistance / Altered mentation []  -  0 Support Surface(s) Assessment (bed, cushion, seat, etc.) INTERVENTIONS - Wound Cleansing / Measurement X - Wound Imaging (photographs - any number of wounds) 1 5 []  - 0 Wound Tracing (instead of photographs) []  - 0 Simple Wound Measurement - one wound  X- 2 5 Complex Wound Measurement - multiple wounds []  - 0 Simple Wound Cleansing - one wound X- 2 5 Complex Wound Cleansing - multiple wounds INTERVENTIONS - Wound Dressings []  - Small Wound Dressing one or multiple wounds 0 []  - 0 Medium Wound Dressing one or multiple wounds []  - 0 Large Wound Dressing one or multiple wounds []  - 0 Application of Medications - injection INTERVENTIONS - Miscellaneous []  - External ear exam 0 []  - 0 Specimen Collection (cultures, biopsies, blood, body fluids, etc.) []  - 0 Specimen(s) / Culture(s) sent or taken to Lab for analysis []  - 0 Patient Transfer (multiple staff / Civil Service fast streamer / Similar devices) []  - 0 Simple Staple / Suture removal (25 or less) []  - 0 Complex Staple / Suture removal (26 or more) []  - 0 Hypo / Hyperglycemic Management (close monitor of Blood Glucose) X- 1 15 Ankle / Brachial Index (ABI) - do not check if billed separately Has the patient been seen at the hospital within the last three years: Yes Total Score: 120 Level Of Care: New/Established - Level 4 Electronic Signature(s) Signed: 06/24/2018 3:34:51 PM By: Roger Shelter Entered By: Roger Shelter on 06/22/2018 13:43:13 [name], [name] ([id number]) -------------------------------------------------------------------------------- Encounter Discharge Information Details Patient Name: [name] Date of Service: 06/22/2018 12:30 PM Medical Record Number: [id number] Patient Account Number: [id number] Date of Birth/Sex: 1951-05-17 (66 y.o. F) Treating RN: Montey Hora Primary Care Lynnzie Blackson: SYSTEM, PCP Other Clinician: Referring Ervie Mccard: Irene Limbo Treating Carr Shartzer/Extender: Melburn Hake, HOYT Weeks in  Treatment: 0 Encounter Discharge Information Items Discharge Condition: Stable Ambulatory Status: Walker Discharge Destination: Home Transportation: Private Auto Accompanied By: staff Schedule Follow-up Appointment: Yes Clinical Summary of Care: Electronic Signature(s) Signed: 06/22/2018 2:05:19 PM By: Montey Hora Entered By: Montey Hora on 06/22/2018 14:05:19 [name] ([id number]) -------------------------------------------------------------------------------- Lower Extremity Assessment Details Patient Name: [name] Date of Service: 06/22/2018 12:30 PM Medical Record Number: [id number] Patient Account Number: [id number] Date of Birth/Sex: January 15, 1951 (66 y.o. F) Treating RN: Cornell Barman Primary Care Sherlock Nancarrow: SYSTEM, PCP Other Clinician: Referring Jesslynn Kruck: Irene Limbo Treating Jaylea Plourde/Extender: Melburn Hake, HOYT Weeks in Treatment: 0 Edema Assessment Assessed: [Left: No] [Right: No] [Left: Edema] [Right: :] Calf Left: Right: Point of Measurement: 32 cm From Medial Instep 46 cm 41.7 cm Ankle Left: Right: Point of Measurement: 12 cm From Medial Instep 30.7 cm 24.5 cm Vascular Assessment Pulses: Dorsalis Pedis Palpable: [Left:No] [Right:No] Doppler Audible: [Left:Yes] [Right:Yes] Posterior Tibial Palpable: [Left:No] [Right:No] Doppler Audible: [Left:Inaudible] Extremity colors, hair growth, and conditions: Extremity Color: [Left:Hyperpigmented] [Right:Hyperpigmented] Hair Growth on Extremity: [Left:No] [Right:No] Temperature of Extremity: [Left:Warm] [Right:Warm] Capillary Refill: [Left:< 3 seconds] [Right:< 3 seconds] Blood Pressure: Brachial: [Left:110] [Right:118] Dorsalis Pedis: 152 [Left:Dorsalis Pedis: 130] Ankle: Posterior Tibial: [Left:Posterior Tibial: 1.29] [Right:1.10] Toe Nail Assessment Left: Right: Thick: Yes Yes Discolored: Yes Yes Deformed: Yes Yes Improper Length and Hygiene: Yes Yes Electronic Signature(s) Signed: 06/22/2018  5:30:39 PM By: Gretta Cool, BSN, RN, CWS, Kim RN, BSN Entered By: Gretta Cool, BSN, RN, CWS, Kim on 06/22/2018 13:18:28 [name], [name] ([id number], [name] ([id number]) -------------------------------------------------------------------------------- Multi Wound Chart Details Patient Name: [name] Date of Service: 06/22/2018 12:30 PM Medical Record Number: [id number] Patient Account Number: [id number] Date of Birth/Sex: 1951/07/19 (66 y.o. F) Treating RN: Roger Shelter Primary Care Devorah Givhan: SYSTEM, PCP Other Clinician: Referring Liller Yohn: Irene Limbo Treating Ebrima Ranta/Extender: STONE III, HOYT Weeks in Treatment: 0 Vital Signs Height(in): 64 Pulse(bpm): 86 Weight(lbs): Blood Pressure(mmHg): 129/56 Body Mass Index(BMI): Temperature(F): 97.9 Respiratory Rate 16 (  breaths/min): Wound Assessments Treatment Notes Electronic Signature(s) Signed: 06/24/2018 3:34:51 PM By: Roger Shelter Entered By: Roger Shelter on 06/22/2018 13:31:07 [name], [name] ([id number]) -------------------------------------------------------------------------------- Multi-Disciplinary Care Plan Details Patient Name: [name] Date of Service: 06/22/2018 12:30 PM Medical Record Number: [id number] Patient Account Number: [id number] Date of Birth/Sex: 1951/08/12 (66 y.o. F) Treating RN: Roger Shelter Primary Care Netanel Yannuzzi: SYSTEM, PCP Other Clinician: Referring Shaneca Orne: Irene Limbo Treating Mustapha Colson/Extender: STONE III, HOYT Weeks in Treatment: 0 Active Inactive ` Orientation to the Wound Care Program Nursing Diagnoses: Knowledge deficit related to the wound healing center program Goals: Patient/caregiver will verbalize understanding of the [place] Program Date Initiated: 06/22/2018 Target Resolution Date: 07/20/2018 Goal Status: Active Interventions: Provide education on orientation to the wound center Notes: ` Wound/Skin Impairment Nursing  Diagnoses: Impaired tissue integrity Goals: Patient/caregiver will verbalize understanding of skin care regimen Date Initiated: 06/22/2018 Target Resolution Date: 07/20/2018 Goal Status: Active Ulcer/skin breakdown will have a volume reduction of 30% by week 4 Date Initiated: 06/22/2018 Target Resolution Date: 07/20/2018 Goal Status: Active Interventions: Assess patient/caregiver ability to obtain necessary supplies Assess patient/caregiver ability to perform ulcer/skin care regimen upon admission and as needed Assess ulceration(s) every visit Treatment Activities: Skin care regimen initiated : 06/22/2018 Notes: Electronic Signature(s) Signed: 06/24/2018 3:34:51 PM By: Terese Door, [name] ([id number]) Entered By: Roger Shelter on 06/22/2018 13:30:53 [name] ([id number]) -------------------------------------------------------------------------------- Non-Wound Condition Assessment Details Patient Name: [name] Date of Service: 06/22/2018 12:30 PM Medical Record Number: [id number] Patient Account Number: [id number] Date of Birth/Sex: Jun 11, 1951 (66 y.o. F) Treating RN: Cornell Barman Primary Care Stepen Prins: SYSTEM, PCP Other Clinician: Referring Sharicka Pogorzelski: Irene Limbo Treating Brinleigh Tew/Extender: STONE III, HOYT Weeks in Treatment: 0 Non-Wound Condition: Condition: Lymphedema Location: Leg Side: Bilateral Photos Periwound Skin Texture Texture Color No Abnormalities Noted: No No Abnormalities Noted: No Hemosiderin Staining: Yes Moisture No Abnormalities Noted: No Electronic Signature(s) Signed: 06/22/2018 5:30:39 PM By: Gretta Cool, BSN, RN, CWS, Kim RN, BSN Entered By: Gretta Cool, BSN, RN, CWS, Kim on 06/22/2018 13:33:22 [name], [name] ([id number]) -------------------------------------------------------------------------------- Pain Assessment Details Patient Name: [name] Date of Service: 06/22/2018 12:30 PM Medical Record Number: [id number] Patient  Account Number: [id number] Date of Birth/Sex: Sep 27, 1951 (66 y.o. F) Treating RN: Cornell Barman Primary Care Torian Thoennes: SYSTEM, PCP Other Clinician: Referring Marlayna Bannister: Irene Limbo Treating Shantele Reller/Extender: Melburn Hake, HOYT Weeks in Treatment: 0 Active Problems Location of Pain Severity and Description of Pain Patient Has Paino No Site Locations With Dressing Change: No Pain Management and Medication Current Pain Management: Electronic Signature(s) Signed: 06/22/2018 5:30:39 PM By: Gretta Cool, BSN, RN, CWS, Kim RN, BSN Entered By: Gretta Cool, BSN, RN, CWS, Kim on 06/22/2018 12:46:53 [name] ([id number]) -------------------------------------------------------------------------------- Patient/Caregiver Education Details Patient Name: [name] Date of Service: 06/22/2018 12:30 PM Medical Record Number: [id number] Patient Account Number: [id number] Date of Birth/Gender: 1951/02/12 (66 y.o. F) Treating RN: Montey Hora Primary Care Physician: SYSTEM, PCP Other Clinician: Referring Physician: Irene Limbo Treating Physician/Extender: Melburn Hake, HOYT Weeks in Treatment: 0 Education Assessment Education Provided To: Patient Education Topics Provided Venous: Handouts: Other: need for compression pumps Methods: Explain/Verbal Responses: State content correctly Electronic Signature(s) Signed: 06/22/2018 5:27:38 PM By: Montey Hora Entered By: Montey Hora on 06/22/2018 14:05:46 [name] ([id number]) -------------------------------------------------------------------------------- [place] Details Patient Name: [name] Date of Service: 06/22/2018 12:30 PM Medical Record Number: [id number] Patient Account Number: [id number] Date of Birth/Sex: Dec 04, 1951 (67 y.o. F) Treating RN: Cornell Barman Primary Care Jamirra Curnow: SYSTEM, PCP Other Clinician: Referring Zaley Talley:  HAGAN, JENNIFER Treating Tri Chittick/Extender: STONE III, HOYT Weeks in Treatment: 0 Vital Signs Time  Taken: 12:46 Temperature (F): 97.9 Height (in): 64 Pulse (bpm): 86 Respiratory Rate (breaths/min): 16 Blood Pressure (mmHg): 129/56 Reference Range: 80 - 120 mg / dl Electronic Signature(s) Signed: 06/22/2018 5:30:39 PM By: Gretta Cool, BSN, RN, CWS, Kim RN, BSN Entered By: Gretta Cool, BSN, RN, CWS, Kim on 06/22/2018 12:47:32

## 2018-06-30 ENCOUNTER — Encounter: Payer: Medicare Other | Admitting: Physician Assistant

## 2018-06-30 DIAGNOSIS — I890 Lymphedema, not elsewhere classified: Secondary | ICD-10-CM | POA: Diagnosis not present

## 2018-07-01 NOTE — Progress Notes (Signed)
[name], [name] ([id number]) Visit Report for 06/30/2018 Arrival Information Details Patient Name: [name] Date of Service: 06/30/2018 10:30 AM Medical Record Number: [id number] Patient Account Number: [id number] Date of Birth/Sex: 10-Aug-1951 (66 y.o. F) Treating RN: Cornell Barman Primary Care Mykia Holton: SYSTEM, PCP Other Clinician: Referring Onisha Cedeno: Irene Limbo Treating Kingsley Farace/Extender: Melburn Hake, HOYT Weeks in Treatment: 1 Visit Information History Since Last Visit Added or deleted any medications: No Patient Arrived: Walker Any new allergies or adverse reactions: No Arrival Time: 11:07 Had a fall or experienced change in No Accompanied By: caregiver activities of daily living that may affect Transfer Assistance: None risk of falls: Patient Identification Verified: Yes Signs or symptoms of abuse/neglect since last visito No Secondary Verification Process Completed: Yes Hospitalized since last visit: No Patient Has Alerts: Yes Implantable device outside of the clinic excluding No Patient Alerts: Type II Diabetic cellular tissue based products placed in the center since last visit: Has Dressing in Place as Prescribed: Yes Pain Present Now: No Electronic Signature(s) Signed: 06/30/2018 5:38:48 PM By: Gretta Cool, BSN, RN, CWS, Kim RN, BSN Entered By: Gretta Cool, BSN, RN, CWS, Kim on 06/30/2018 11:07:33 [name] ([id number]) -------------------------------------------------------------------------------- Lower Extremity Assessment Details Patient Name: [name] Date of Service: 06/30/2018 10:30 AM Medical Record Number: [id number] Patient Account Number: [id number] Date of Birth/Sex: Jan 09, 1951 (66 y.o. F) Treating RN: Cornell Barman Primary Care Margueritte Guthridge: SYSTEM, PCP Other Clinician: Referring Leshawn Houseworth: Irene Limbo Treating Joenathan Sakuma/Extender: Melburn Hake, HOYT Weeks in Treatment: 1 Edema Assessment Assessed: [Left: No] [Right: No] [Left: Edema] [Right:  :] Calf Left: Right: Point of Measurement: 32 cm From Medial Instep 49.8 cm 41.5 cm Ankle Left: Right: Point of Measurement: 12 cm From Medial Instep 31 cm 25 cm Vascular Assessment Pulses: Dorsalis Pedis Palpable: [Left:No] [Right:No] Doppler Audible: [Left:Yes] [Right:Yes] Posterior Tibial Extremity colors, hair growth, and conditions: Extremity Color: [Left:Hyperpigmented] [Right:Hyperpigmented] Hair Growth on Extremity: [Left:No] [Right:No] Temperature of Extremity: [Left:Warm] [Right:Warm] Capillary Refill: [Left:< 3 seconds] [Right:< 3 seconds] Dependent Rubor: [Left:No] [Right:No] Blanched when Elevated: [Left:No] [Right:No] Lipodermatosclerosis: [Left:No] [Right:No] Toe Nail Assessment Left: Right: Thick: Yes Yes Discolored: Yes Yes Deformed: Yes Yes Improper Length and Hygiene: Yes Electronic Signature(s) Signed: 06/30/2018 5:38:48 PM By: Gretta Cool, BSN, RN, CWS, Kim RN, BSN Entered By: Gretta Cool, BSN, RN, CWS, Kim on 06/30/2018 11:22:58 [name], [name] ([id number]) -------------------------------------------------------------------------------- Multi Wound Chart Details Patient Name: [name] Date of Service: 06/30/2018 10:30 AM Medical Record Number: [id number] Patient Account Number: [id number] Date of Birth/Sex: Aug 19, 1951 (66 y.o. F) Treating RN: Ahmed Prima Primary Care Laylani Pudwill: SYSTEM, PCP Other Clinician: Referring Keyonta Barradas: Irene Limbo Treating Liston Thum/Extender: STONE III, HOYT Weeks in Treatment: 1 Vital Signs Height(in): 64 Pulse(bpm): 86 Weight(lbs): Blood Pressure(mmHg): 143/66 Body Mass Index(BMI): Temperature(F): 97.7 Respiratory Rate 16 (breaths/min): Wound Assessments Treatment Notes Electronic Signature(s) Signed: 06/30/2018 5:13:20 PM By: Alric Quan Entered By: Alric Quan on 06/30/2018 11:25:47 [name], [name]  ([id number]) -------------------------------------------------------------------------------- Multi-Disciplinary Care Plan Details Patient Name: [name] Date of Service: 06/30/2018 10:30 AM Medical Record Number: [id number] Patient Account Number: [id number] Date of Birth/Sex: Jul 22, 1951 (66 y.o. F) Treating RN: Ahmed Prima Primary Care Lilinoe Acklin: SYSTEM, PCP Other Clinician: Referring Zimri Brennen: Irene Limbo Treating Tenishia Ekman/Extender: STONE III, HOYT Weeks in Treatment: 1 Active Inactive ` Orientation to the Wound Care Program Nursing Diagnoses: Knowledge deficit related to the wound healing center program Goals: Patient/caregiver will verbalize understanding of the [place] Program Date Initiated: 06/22/2018 Target Resolution Date: 07/20/2018 Goal Status: Active Interventions: Provide education on orientation to  the wound center Notes: ` Wound/Skin Impairment Nursing Diagnoses: Impaired tissue integrity Goals: Patient/caregiver will verbalize understanding of skin care regimen Date Initiated: 06/22/2018 Target Resolution Date: 07/20/2018 Goal Status: Active Ulcer/skin breakdown will have a volume reduction of 30% by week 4 Date Initiated: 06/22/2018 Target Resolution Date: 07/20/2018 Goal Status: Active Interventions: Assess patient/caregiver ability to obtain necessary supplies Assess patient/caregiver ability to perform ulcer/skin care regimen upon admission and as needed Assess ulceration(s) every visit Treatment Activities: Skin care regimen initiated : 06/22/2018 Notes: Electronic Signature(s) Signed: 06/30/2018 5:13:20 PM By: Lucila Maine, Garvin Fila ([id number]) Entered By: Alric Quan on 06/30/2018 11:25:21 [name] ([id number]) -------------------------------------------------------------------------------- Non-Wound Condition Assessment Details Patient Name: [name] Date of Service: 06/30/2018 10:30 AM Medical  Record Number: [id number] Patient Account Number: [id number] Date of Birth/Sex: 22-Apr-1951 (66 y.o. F) Treating RN: Cornell Barman Primary Care Vlasta Baskin: SYSTEM, PCP Other Clinician: Referring Ammiel Guiney: Irene Limbo Treating Dorene Bruni/Extender: STONE III, HOYT Weeks in Treatment: 1 Non-Wound Condition: Condition: Lymphedema Location: Leg Side: Bilateral Photos Periwound Skin Texture Texture Color No Abnormalities Noted: No No Abnormalities Noted: No Callus: No Atrophie Blanche: No Crepitus: No Cyanosis: No Excoriation: No Ecchymosis: No Friable: No Erythema: No Induration: Yes Hemosiderin Staining: Yes Rash: No Mottled: No Scarring: No Pallor: No Rubor: No Moisture No Abnormalities Noted: No Temperature / Pain Dry / Scaly: Yes Temperature: No Abnormality Maceration: No Electronic Signature(s) Signed: 06/30/2018 11:46:23 AM By: Roger Shelter Signed: 06/30/2018 5:38:48 PM By: Gretta Cool, BSN, RN, CWS, Kim RN, BSN Entered By: Roger Shelter on 06/30/2018 11:44:22 [name], [name] ([id number]) -------------------------------------------------------------------------------- Pain Assessment Details Patient Name: [name] Date of Service: 06/30/2018 10:30 AM Medical Record Number: [id number] Patient Account Number: [id number] Date of Birth/Sex: 26-Jun-1951 (67 y.o. F) Treating RN: Cornell Barman Primary Care Aubra Pappalardo: SYSTEM, PCP Other Clinician: Referring Naria Abbey: Irene Limbo Treating Roselinda Bahena/Extender: Melburn Hake, HOYT Weeks in Treatment: 1 Active Problems Location of Pain Severity and Description of Pain Patient Has Paino No Site Locations With Dressing Change: No Pain Management and Medication Current Pain Management: Electronic Signature(s) Signed: 06/30/2018 5:38:48 PM By: Gretta Cool, BSN, RN, CWS, Kim RN, BSN Entered By: Gretta Cool, BSN, RN, CWS, Kim on 06/30/2018 11:07:43 [name]  ([id number]) -------------------------------------------------------------------------------- [place] Details Patient Name: [name] Date of Service: 06/30/2018 10:30 AM Medical Record Number: [id number] Patient Account Number: [id number] Date of Birth/Sex: 1951/02/19 (66 y.o. F) Treating RN: Cornell Barman Primary Care Truitt Cruey: SYSTEM, PCP Other Clinician: Referring Divya Munshi: Irene Limbo Treating Alexandre Lightsey/Extender: STONE III, HOYT Weeks in Treatment: 1 Vital Signs Time Taken: 11:07 Temperature (F): 97.7 Height (in): 64 Pulse (bpm): 86 Respiratory Rate (breaths/min): 16 Blood Pressure (mmHg): 143/66 Reference Range: 80 - 120 mg / dl Electronic Signature(s) Signed: 06/30/2018 5:38:48 PM By: Gretta Cool, BSN, RN, CWS, Kim RN, BSN Entered By: Gretta Cool, BSN, RN, CWS, Kim on 06/30/2018 11:08:39

## 2018-07-02 NOTE — Progress Notes (Signed)
IZABELA, OW ([id number]) Visit Report for 06/30/2018 Chief Complaint Document Details Patient Name: [name], [name] Date of Service: 06/30/2018 10:30 AM Medical Record Number: [id number] Patient Account Number: [id number] Date of Birth/Sex: 1951-08-27 (67 y.o. F) Treating RN: Ahmed Prima Primary Care Provider: SYSTEM, PCP Other Clinician: Referring Provider: Irene Limbo Treating Provider/Extender: Melburn Hake, Jolonda Gomm Weeks in Treatment: 1 Information Obtained from: Patient Chief Complaint Bilateral LE Lymphedema Electronic Signature(s) Signed: 06/30/2018 11:53:57 PM By: Worthy Keeler PA-C Entered By: Worthy Keeler on 06/30/2018 10:45:11 NAIRA, STANDIFORD ([id number]) -------------------------------------------------------------------------------- HPI Details Patient Name: [name] Date of Service: 06/30/2018 10:30 AM Medical Record Number: [id number] Patient Account Number: [id number] Date of Birth/Sex: [birth year] (67 y.o. 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 Fortunately she does not have any active open wounds at this point in time. Unfortunately this has apparently been a scenario that comes and goes quite frequently due to the lymphedema that she's had present for quite a number of years. She does have some difficulty apparently wearing standard compression stockings and may need custom compression in order to correctly manage this. She does have lymphedema pumps which are at home in her apartment although she no longer lives there and will not be going back there she is currently residing in assisted  living. Nonetheless she has not been able to use her pumps for quite some time as she has not been able to apparently accept them due to a very complicated scenario that I do not fully understand. Nonetheless she states that she's gonna have to write some kind a letter to the owner of the property till our friend to come in and remove her belonging and get them to her. I believe she needs to do this sooner rather than later to get her compression pumps. Nonetheless otherwise she seems to be doing fairly well all things considered and she did have Unna Boot wraps up on evaluation today. 06/30/18 on evaluation today patient actually appears to be doing fairly well in regard to her bilateral lower extremities. She continues to have significant bilateral lymphedema she does have two small blisters which are not open in regard to left lower extremity but in general this is doing excellent. She actually is about to get her lymphedema pumps that she's actually receiving the new sleeves in the next two days which I think will be of benefit. We had a discussion about compression therapy and what she needs to use on a daily basis again during the evaluation today. I believe that she really does need to have some kind compression she's unable to slide on compression stockings if nothing else we will probably need to see about an ace wrap. Electronic Signature(s) Signed: 06/30/2018 11:53:57 PM By: Worthy Keeler PA-C Entered By: Worthy Keeler on 06/30/2018 21:11:20 TAKYIA, SINDT ([id number]) -------------------------------------------------------------------------------- Physical Exam Details Patient Name: [name] Date of Service: 06/30/2018 10:30 AM Medical Record Number: [id number] Patient Account Number: [id number] Date of Birth/Sex: [birth year] (67 y.o. F) Treating RN: Ahmed Prima Primary Care Provider: SYSTEM, PCP Other Clinician: Referring Provider: Irene Limbo Treating  Provider/Extender: STONE III, Hardin Hardenbrook Weeks in Treatment: 1 Constitutional Chronically ill appearing but in no apparent  acute distress. Respiratory normal breathing without difficulty. clear to auscultation bilaterally. Cardiovascular regular rate and rhythm with normal S1, S2. 2+ pitting edema of the bilateral lower extremities. Psychiatric this patient is able to make decisions and demonstrates good insight into disease process. Alert and Oriented x 3. pleasant and cooperative. Notes At this time I am definitely thinking that she's making good progress in regard to the lower extremity edema. I do believe that again she's been always have some swelling which is unfortunate but nonetheless a reality. With that being said I think when she gets her lymphedema pumps up and running this will be of great benefit to her as far as the swelling and edema is concerned. Electronic Signature(s) Signed: 06/30/2018 11:53:57 PM By: Worthy Keeler PA-C Entered By: Worthy Keeler on 06/30/2018 21:12:05 [name] ([id number]) -------------------------------------------------------------------------------- Physician Orders Details Patient Name: [name] Date of Service: 06/30/2018 10:30 AM Medical Record Number: [id number] Patient Account Number: [id number] Date of Birth/Sex: 1951/10/03 (67 y.o. F) Treating RN: Ahmed Prima Primary Care Provider: SYSTEM, PCP Other Clinician: Referring Provider: Irene Limbo Treating Provider/Extender: Melburn Hake, Tonnya Garbett Weeks in Treatment: 1 Verbal / Phone Orders: Yes Clinician: Pinkerton, Debi Read Back and Verified: Yes Diagnosis Coding ICD-10 Coding Code Description I89.0 Lymphedema, not elsewhere classified E11.42 Type 2 diabetes mellitus with diabetic polyneuropathy I10 Essential (primary) hypertension Wound Cleansing o Clean wound with Normal Saline. o Cleanse wound with mild soap and water Skin Barriers/Peri-Wound Care o Moisturizing  lotion Dressing Change Frequency o Other: - Tuesdays and Fridays [facility] to change on Fridays and pt will be seen on Tuesdays at the [place] o Return Appointment in 1 week. o Nurse Visit as needed Edema Control o 3 Layer Compression System - Bilateral - unna to anchor, wrap around foot once and then wrap wound lower leg approximately 2 to 3 finger widths below the knee. this is to anchor dressings from sliding. wraps need to be 2 finger widths below the toes up to 2 to 3 finger widths below knee o Compression Pump: Use compression pump on left lower extremity for 30 minutes, twice daily. - use one hour twice daily o Compression Pump: Use compression pump on right lower extremity for 30 minutes, twice daily. - use one hour twice daily Off-Loading o Turn and reposition every 2 hours o Other: - elevate lower extremities to assist with edema control in lower legs Additional Orders / Instructions o Vitamin A; Vitamin C, Zinc o Increase protein intake. [place] Visits o Home Health Nurse may visit PRN to address patientos wound care needs. o FACE TO FACE ENCOUNTER: MEDICARE and MEDICAID PATIENTS: I certify that this patient is under my care and that I had a face-to-face encounter that meets the physician face-to-face encounter requirements with this patient on this date. The encounter with the patient was in whole or in part for the following MEDICAL CONDITION: (primary reason for [place]) MEDICAL NECESSITY: I certify, that based on my findings, NURSING services are a medically necessary home health service. HOME BOUND STATUS: I certify that my clinical findings support that this patient is homebound (i.e., Due to illness or injury, pt requires aid of ZIYANNA, TOLIN. ([id number]) supportive devices such as crutches, cane, wheelchairs, walkers, the use of special transportation or the assistance of  another person to leave their place of residence. There is a normal inability to leave the home and doing so requires considerable and taxing  effort. Other absences are for medical reasons / religious services and are infrequent or of short duration when for other reasons). o If current dressing causes regression in wound condition, may D/C ordered dressing product/s and apply Normal Saline Moist Dressing daily until next [place] / Other MD appointment. [place] of regression in wound condition at [phone]. o Please direct any NON-WOUND related issues/requests for orders to patient's Primary Care Physician Electronic Signature(s) Signed: 06/30/2018 5:13:20 PM By: Alric Quan Signed: 06/30/2018 11:53:57 PM By: Worthy Keeler PA-C Entered By: Alric Quan on 06/30/2018 11:36:10 NIQUITA, DIGIOIA ([id number]) -------------------------------------------------------------------------------- Problem List Details Patient Name: [name] Date of Service: 06/30/2018 10:30 AM Medical Record Number: [id number] Patient Account Number: [id number] Date of Birth/Sex: [birth year] (67 y.o. F) Treating RN: Ahmed Prima Primary Care Provider: SYSTEM, PCP Other Clinician: Referring Provider: Irene Limbo Treating Provider/Extender: Melburn Hake, Roxane [place] Weeks in Treatment: 1 Active Problems ICD-10 Evaluated Encounter Code Description Active Date Today Diagnosis I89.0 Lymphedema, not elsewhere classified 06/22/2018 No Yes E11.42 Type 2 diabetes mellitus with diabetic polyneuropathy 06/22/2018 No Yes I10 Essential (primary) hypertension 06/22/2018 No Yes Inactive Problems Resolved Problems Electronic Signature(s) Signed: 06/30/2018 11:53:57 PM By: Worthy Keeler PA-C Entered By: Worthy Keeler on 06/30/2018 10:44:43 Kallstrom, Garvin Fila ([id number]) -------------------------------------------------------------------------------- Progress Note Details Patient Name:  [name] Date of Service: 06/30/2018 10:30 AM Medical Record Number: [id number] Patient Account Number: [id number] Date of Birth/Sex: 11-Dec-1951 (67 y.o. 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 Fortunately she does not have any active open wounds at this point in time. Unfortunately this has apparently been a scenario that comes and goes quite frequently due to the lymphedema that she's had present for quite a number of years. She does have some difficulty apparently wearing standard compression stockings and may need custom compression in order to correctly manage this. She does have lymphedema pumps which are at home in her apartment although she no longer lives there and will not be going back there she is currently residing in assisted living. Nonetheless she has not been able to use her pumps for quite some time as she has not been able to apparently accept them due to a very complicated scenario that I do not fully understand. Nonetheless she states that she's gonna have to write some kind a letter to the owner of the property till our friend to come in and remove her belonging and get them to her. I believe she needs to do this sooner rather than later to get her compression pumps. Nonetheless otherwise she seems to be doing fairly well all things considered and she did have Unna Boot wraps up on evaluation today. 06/30/18 on evaluation today patient actually appears to be doing fairly well in regard to her bilateral lower extremities.  She continues to have significant bilateral lymphedema she does have two small blisters which are not open in regard to left lower extremity but in general this is doing excellent. She actually is about to get her lymphedema pumps that she's actually receiving the new sleeves in the next two days which I think will be of benefit.  We had a discussion about compression therapy and what she needs to use on a daily basis again during the evaluation today. I believe that she really does need to have some kind compression she's unable to slide on compression stockings if nothing else we will probably need to see about an ace wrap. Patient History Information obtained from Patient. Family History Diabetes - Mother, Heart Disease - Mother, Hypertension - Mother, No family history of Cancer, Kidney Disease, Lung Disease, Seizures, Stroke, Thyroid Problems, Tuberculosis. Social History Never smoker, Marital Status - Single, Alcohol Use - Never, Drug Use - Prior History, Caffeine Use - Daily. Medical And Surgical History Notes Genitourinary Foley Catheter Review of Systems (ROS) Constitutional Symptoms (General Health) Denies complaints or symptoms of Fever, Chills. Respiratory The patient has no complaints or symptoms. Cardiovascular ZAIDE, MCCLENAHAN. ([id number]) Complains or has symptoms of LE edema. Psychiatric The patient has no complaints or symptoms. Objective Constitutional Chronically ill appearing but in no apparent acute distress. Vitals Time Taken: 11:07 AM, Height: 64 in, Temperature: 97.7 F, Pulse: 86 bpm, Respiratory Rate: 16 breaths/min, Blood Pressure: 143/66 mmHg. Respiratory normal breathing without difficulty. clear to auscultation bilaterally. Cardiovascular regular rate and rhythm with normal S1, S2. 2+ pitting edema of the bilateral lower extremities. Psychiatric this patient is able to make decisions and demonstrates good insight into disease process. Alert and  Oriented x 3. pleasant and cooperative. General Notes: At this time I am definitely thinking that she's making good progress in regard to the lower extremity edema. I do believe that again she's been always have some swelling which is unfortunate but nonetheless a reality. With that being said I think when she gets her lymphedema pumps up and running this will be of great benefit to her as far as the swelling and edema is concerned. Other Condition(s) Patient presents with Lymphedema located on the Bilateral Leg. The skin appearance exhibited: Dry/Scaly, Hemosiderin Staining, Induration. The skin appearance did not exhibit: Atrophie Blanche, Callus, Crepitus, Cyanosis, Ecchymosis, Erythema, Excoriation, Friable, Maceration, Mottled, Pallor, Rash, Rubor, Scarring. Skin temperature was noted as No Abnormality. Assessment Active Problems ICD-10 Lymphedema, not elsewhere classified Type 2 diabetes mellitus with diabetic polyneuropathy Essential (primary) hypertension Salzman, Laurianne K. ([id number]) Plan Wound Cleansing: Clean wound with Normal Saline. Cleanse wound with mild soap and water Skin Barriers/Peri-Wound Care: Moisturizing lotion Dressing Change Frequency: Other: - Tuesdays and Fridays [facility] to change on Fridays and pt will be seen on Tuesdays at the [facility] Follow-up Appointments: Return Appointment in 1 week. Nurse Visit as needed Edema Control: 3 Layer Compression System - Bilateral - unna to anchor, wrap around foot once and then wrap wound lower leg approximately 2 to 3 finger widths below the knee. this is to anchor dressings from sliding. wraps need to be 2 finger widths below the toes up to 2 to 3 finger widths below knee Compression Pump: Use compression pump on left lower extremity for 30 minutes, twice daily. - use one hour twice daily Compression Pump: Use compression pump on right lower extremity for 30 minutes, twice daily. - use one hour twice  daily Off-Loading: Turn and reposition every 2 hours Other: - elevate lower extremities to assist with edema control in lower legs Additional Orders / Instructions: Vitamin A; Vitamin C, Zinc Increase protein intake. Home Health: [place] Nurse may visit PRN to address patient s wound care needs. FACE TO FACE ENCOUNTER: MEDICARE and MEDICAID PATIENTS: I certify that this patient is  under my care and that I had a face-to-face encounter that meets the physician face-to-face encounter requirements with this patient on this date. The encounter with the patient was in whole or in part for the following MEDICAL CONDITION: (primary reason for [place]) MEDICAL NECESSITY: I certify, that based on my findings, NURSING services are a medically necessary home health service. HOME BOUND STATUS: I certify that my clinical findings support that this patient is homebound (i.e., Due to illness or injury, pt requires aid of supportive devices such as crutches, cane, wheelchairs, walkers, the use of special transportation or the assistance of another person to leave their place of residence. There is a normal inability to leave the home and doing so requires considerable and taxing effort. Other absences are for medical reasons / religious services and are infrequent or of short duration when for other reasons). If current dressing causes regression in wound condition, may D/C ordered dressing product/s and apply Normal Saline Moist Dressing daily until next [place] / Other MD appointment. [place] of regression in wound condition at [phone]. Please direct any NON-WOUND related issues/requests for orders to patient's Primary Care Physician At this time I'm gonna recommend that we go ahead and initiate the above wound care orders for the next week. Patients in agreement with the plan. Fortunately there are no open wounds and I think that  the compression wraps are helping we will see were things stand in one weeks time. If she gets her compression pumps up and running and everything else in place I think we will likely discharge her to the facilities care at which point we will likely switch her to an ace wrap since she's unable to utilize compression stockings and his Juxta-Lite compression wraps appear to likely be too expensive for her at this point. I completely understand this as well. If anything changes in the meantime she will let me know otherwise will continue with her wraps at this point. Please see above for specific wound care orders. We will see patient for re-evaluation in 1 week(s) here in the clinic. If anything worsens or changes patient will contact our office for additional recommendations. JAYLEEN, SCAGLIONE ([id number]) Electronic Signature(s) Signed: 06/30/2018 11:53:57 PM By: Worthy Keeler PA-C Entered By: Worthy Keeler on 06/30/2018 21:13:00 BELLAMARIE, PFLUG ([id number]) -------------------------------------------------------------------------------- ROS/PFSH Details Patient Name: [name] Date of Service: 06/30/2018 10:30 AM Medical Record Number: [id number] Patient Account Number: [id number] Date of Birth/Sex: [birth year] (67 y.o. F) Treating RN: Ahmed Prima Primary Care Provider: SYSTEM, PCP Other Clinician: Referring Provider: Irene Limbo Treating Provider/Extender: STONE III, Layla Kesling Weeks in Treatment: 1 Information Obtained From Patient Wound History Do you currently have one or more open woundso Yes Have you had any tests for circulation on your legso No Constitutional Symptoms (General Health) Complaints and Symptoms: Negative for: Fever; Chills Cardiovascular Complaints and Symptoms: Positive for: LE edema Medical History: Positive for: Hypertension; Peripheral Venous Disease Negative for: Angina; Arrhythmia; Congestive Heart Failure; Coronary Artery Disease; Deep Vein  Thrombosis; Hypotension; Myocardial Infarction; Peripheral Arterial Disease; Phlebitis; Vasculitis Eyes Medical History: Positive for: Cataracts - removed in 2009 Negative for: Glaucoma; Optic Neuritis Ear/Nose/Mouth/Throat Medical History: Negative for: Chronic sinus problems/congestion; Middle ear problems Hematologic/Lymphatic Medical History: Positive for: Lymphedema - bilateral Negative for: Anemia; Hemophilia; Human Immunodeficiency Virus; Sickle Cell Disease Respiratory Complaints and Symptoms: No Complaints or Symptoms Medical History: Positive for: Asthma Negative for: Aspiration; Chronic Obstructive Pulmonary Disease (COPD); Pneumothorax; Sleep Apnea; Tuberculosis  Gastrointestinal EMNET, MONK ([id number]) Medical History: Negative for: Cirrhosis ; Colitis; Crohnos; Hepatitis A; Hepatitis B; Hepatitis C Endocrine Medical History: Positive for: Type II Diabetes Negative for: Type I Diabetes Time with diabetes: 10 plus Treated with: Insulin Blood sugar tested every day: No Genitourinary Medical History: Negative for: End Stage Renal Disease Past Medical History Notes: Foley Catheter Immunological Medical History: Negative for: Lupus Erythematosus; Raynaudos; Scleroderma Integumentary (Skin) Medical History: Negative for: History of Burn; History of pressure wounds Musculoskeletal Medical History: Negative for: Gout; Rheumatoid Arthritis; Osteoarthritis; Osteomyelitis Neurologic Medical History: Positive for: Neuropathy Negative for: Dementia; Quadriplegia; Paraplegia; Seizure Disorder Oncologic Medical History: Negative for: Received Chemotherapy; Received Radiation Psychiatric Complaints and Symptoms: No Complaints or Symptoms Medical History: Negative for: Anorexia/bulimia; Confinement Anxiety HBO Extended History Items Eyes: Cataracts Immunizations DOYLE, TEGETHOFF ([id number]) Pneumococcal Vaccine: Received Pneumococcal Vaccination:  Yes Implantable Devices Family and Social History Cancer: No; Diabetes: Yes - Mother; Heart Disease: Yes - Mother; Hypertension: Yes - Mother; Kidney Disease: No; Lung Disease: No; Seizures: No; Stroke: No; Thyroid Problems: No; Tuberculosis: No; Never smoker; Marital Status - Single; Alcohol Use: Never; Drug Use: Prior History; Caffeine Use: Daily; Living Will: Yes (Not Provided); Medical Power of Attorney: Yes (Not Provided) Physician Affirmation I have reviewed and agree with the above information. Electronic Signature(s) Signed: 06/30/2018 11:53:57 PM By: Worthy Keeler PA-C Signed: 07/01/2018 5:25:23 PM By: Alric Quan Entered By: Worthy Keeler on 06/30/2018 21:11:41 DEAIRRA, HALLECK ([id number]) -------------------------------------------------------------------------------- SuperBill Details Patient Name: [name] Date of Service: 06/30/2018 Medical Record Number: [id number] Patient Account Number: [id number] Date of Birth/Sex: 04/01/1951 (67 y.o. F) Treating RN: Ahmed Prima Primary Care Provider: SYSTEM, PCP Other Clinician: Referring Provider: Irene Limbo Treating Provider/Extender: STONE III, Dawnyel Leven Weeks in Treatment: 1 Diagnosis Coding ICD-10 Codes Code Description I89.0 Lymphedema, not elsewhere classified E11.42 Type 2 diabetes mellitus with diabetic polyneuropathy I10 Essential (primary) hypertension Facility Procedures CPT4: Description Modifier Quantity Code [id number] BILATERAL: Application of multi-layer venous compression system; leg (below 1 knee), including ankle and foot. Physician Procedures CPT4 Code: 1749449 Description: 67591 - WC PHYS LEVEL 3 - EST PT ICD-10 Diagnosis Description I89.0 Lymphedema, not elsewhere classified E11.42 Type 2 diabetes mellitus with diabetic polyneuropathy I10 Essential (primary) hypertension Modifier: Quantity: 1 Electronic Signature(s) Signed: 06/30/2018 11:53:57 PM By: Worthy Keeler PA-C Previous  Signature: 06/30/2018 5:13:20 PM Version By: Alric Quan Entered By: Worthy Keeler on 06/30/2018 21:13:21

## 2018-07-07 ENCOUNTER — Encounter: Payer: Medicare Other | Admitting: Physician Assistant

## 2018-07-07 DIAGNOSIS — I890 Lymphedema, not elsewhere classified: Secondary | ICD-10-CM | POA: Diagnosis not present

## 2018-07-08 ENCOUNTER — Ambulatory Visit
Admission: RE | Admit: 2018-07-08 | Discharge: 2018-07-08 | Disposition: A | Discharge status: Home / Self Care | Payer: Medicare Other | Source: Ambulatory Visit | Attending: Urology | Admitting: Urology

## 2018-07-08 DIAGNOSIS — R3129 Other microscopic hematuria: Secondary | ICD-10-CM | POA: Diagnosis not present

## 2018-07-08 DIAGNOSIS — Q631 Lobulated, fused and horseshoe kidney: Secondary | ICD-10-CM | POA: Insufficient documentation

## 2018-07-08 DIAGNOSIS — N2889 Other specified disorders of kidney and ureter: Secondary | ICD-10-CM | POA: Diagnosis not present

## 2018-07-08 DIAGNOSIS — N1330 Unspecified hydronephrosis: Secondary | ICD-10-CM

## 2018-07-08 DIAGNOSIS — I700 Atherosclerosis of aorta: Secondary | ICD-10-CM | POA: Insufficient documentation

## 2018-07-08 DIAGNOSIS — R590 Localized enlarged lymph nodes: Secondary | ICD-10-CM | POA: Insufficient documentation

## 2018-07-08 HISTORY — DX: Essential (primary) hypertension: I10

## 2018-07-08 LAB — POCT I-STAT CREATININE: Creatinine, Ser: 0.9 mg/dL (ref 0.44–1.00)

## 2018-07-08 MED ORDER — IOPAMIDOL (ISOVUE-300) INJECTION 61%
125.0000 mL | Freq: Once | INTRAVENOUS | Status: AC | PRN
  Administered 2018-07-08: 125 mL via INTRAVENOUS

## 2018-07-09 NOTE — Progress Notes (Signed)
IRA, BUSBIN ([id number]) Visit Report for 07/07/2018 Chief Complaint Document Details Patient Name: [name], [name] Date of Service: 07/07/2018 10:15 AM Medical Record Number: [id number] Patient Account Number: [id number] Date of Birth/Sex: 07/02/1951 (67 y.o. F) Treating RN: Ahmed Prima Primary Care Provider: SYSTEM, PCP Other Clinician: Referring Provider: Irene Limbo Treating Provider/Extender: Melburn Hake, Candler Ginsberg Weeks in Treatment: 2 Information Obtained from: Patient Chief Complaint Bilateral LE Lymphedema Electronic Signature(s) Signed: 07/08/2018 1:39:21 PM By: Worthy Keeler PA-C Entered By: Worthy Keeler on 07/07/2018 10:00:19 [name], [name] ([id number]) -------------------------------------------------------------------------------- HPI Details Patient Name: [name] Date of Service: 07/07/2018 10:15 AM Medical Record Number: [id number] Patient Account Number: [id number] Date of Birth/Sex: 04/14/1951 (67 y.o. 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 Fortunately she does not have any active open wounds at this point in time. Unfortunately this has apparently been a scenario that comes and goes quite frequently due to the lymphedema that she's had present for quite a number of years. She does have some difficulty apparently wearing standard compression stockings and may need custom compression in order to correctly manage this. She does have lymphedema pumps which are at home in her apartment although she no longer lives there and will not be going back there she is currently residing in  assisted living. Nonetheless she has not been able to use her pumps for quite some time as she has not been able to apparently accept them due to a very complicated scenario that I do not fully understand. Nonetheless she states that she's gonna have to write some kind a letter to the owner of the property till our friend to come in and remove her belonging and get them to her. I believe she needs to do this sooner rather than later to get her compression pumps. Nonetheless otherwise she seems to be doing fairly well all things considered and she did have Unna Boot wraps up on evaluation today. 06/30/18 on evaluation today patient actually appears to be doing fairly well in regard to her bilateral lower extremities. She continues to have significant bilateral lymphedema she does have two small blisters which are not open in regard to left lower extremity but in general this is doing excellent. She actually is about to get her lymphedema pumps that she's actually receiving the new sleeves in the next two days which I think will be of benefit. We had a discussion about compression therapy and what she needs to use on a daily basis again during the evaluation today. I believe that she really does need to have some kind compression she's unable to slide on compression stockings if nothing else we will probably need to see about an ace wrap. 07/07/18 on evaluation today patient actually appears to be doing very well in regard to her bilateral lower extremities. She does have one area of blistering which opened on the left interior shin but this is fairly superficial and does not appear to be significant issue is mainly part of the lymphedema scenario. Nonetheless she has gotten her lymphedema pumps to her new residence and up and running and currently she states that she began using them yesterday. Fortunately there does not appear to be any  evidence of infection at this time No fevers, chills, nausea, or  vomiting noted at this time. Electronic Signature(s) Signed: 07/08/2018 1:39:21 PM By: Worthy Keeler PA-C Entered By: Worthy Keeler on 07/08/2018 13:17:52 [name], [name] ([id number]) -------------------------------------------------------------------------------- Physical Exam Details Patient Name: [name] Date of Service: 07/07/2018 10:15 AM Medical Record Number: [id number] Patient Account Number: [id number] Date of Birth/Sex: 08/16/1951 (67 y.o. F) Treating RN: Ahmed Prima Primary Care Provider: SYSTEM, PCP Other Clinician: Referring Provider: Irene Limbo Treating Provider/Extender: STONE III, Julie Paolini Weeks in Treatment: 2 Constitutional Well-nourished and well-hydrated in no acute distress. Respiratory normal breathing without difficulty. clear to auscultation bilaterally. Cardiovascular 1+ pitting edema of the bilateral lower extremities. Psychiatric this patient is able to make decisions and demonstrates good insight into disease process. Alert and Oriented x 3. pleasant and cooperative. Notes Patient's wound at this point appears to be very superficial for swelling is doing fairly well and I do think she's tolerating the wraps. I'm in a recommend that we continue the wraps for the next week while she begins and continues to use the lymphedema pumps. Hopefully we will be able to discharge her shortly. Electronic Signature(s) Signed: 07/08/2018 1:39:21 PM By: Worthy Keeler PA-C Entered By: Worthy Keeler on 07/08/2018 13:18:35 [name], [name] ([id number]) -------------------------------------------------------------------------------- Physician Orders Details Patient Name: [name] Date of Service: 07/07/2018 10:15 AM Medical Record Number: [id number] Patient Account Number: [id number] Date of Birth/Sex: [birth year] (67 y.o. F) Treating RN: Ahmed Prima Primary Care Provider: SYSTEM, PCP Other Clinician: Referring Provider: Irene Limbo Treating Provider/Extender: Melburn Hake, Jorden Minchey Weeks in Treatment: 2 Verbal / Phone Orders: Yes Clinician: Pinkerton, Debi Read Back and Verified: Yes Diagnosis Coding ICD-10 Coding Code Description I89.0 Lymphedema, not elsewhere classified E11.42 Type 2 diabetes mellitus with diabetic polyneuropathy I10 Essential (primary) hypertension Wound Cleansing o Clean wound with Normal Saline. o Cleanse wound with mild soap and water Skin Barriers/Peri-Wound Care o Moisturizing lotion Primary Wound Dressing Wound #1 Left,Medial Lower Leg o Silver Alginate Secondary Dressing Wound #1 Left,Medial Lower Leg o ABD pad Dressing Change Frequency o Other: - Tuesdays and Fridays [facility] to change on Fridays and pt will be seen on Tuesdays at the [facility] o Return Appointment in 1 week. o Nurse Visit as needed Edema Control o 3 Layer Compression System - Bilateral - wraps need to be 2 finger widths below the toes up to 2 to 3 finger widths below knee DO NOT DO UNNA BOOTS THIS IS A 3-LAYER WRAP ONLY TO BILATERAL LEGS o Elevate legs to the level of the heart and pump ankles as often as possible o Compression Pump: Use compression pump on left lower extremity for 30 minutes, twice daily. - use one hour twice daily o Compression Pump: Use compression pump on right lower extremity for 30 minutes, twice daily. - use one hour twice daily o Other: - HHRN please order velcro compression wrap for left leg Off-Loading o Turn and reposition every 2 hours o Other: - elevate lower extremities to assist with edema control in lower legs Additional Orders / Instructions o Vitamin A; Vitamin C, Zinc [name], Yolanda K. ([id number]) o Increase protein intake. [place] Visits o Home Health Nurse may visit PRN to address patientos wound care needs. o FACE TO FACE ENCOUNTER: MEDICARE and MEDICAID PATIENTS: I  certify that this patient is under my care and that I had a face-to-face encounter that meets the physician face-to-face  encounter requirements with this patient on this date. The encounter with the patient was in whole or in part for the following MEDICAL CONDITION: (primary reason for [place]) MEDICAL NECESSITY: I certify, that based on my findings, NURSING services are a medically necessary home health service. HOME BOUND STATUS: I certify that my clinical findings support that this patient is homebound (i.e., Due to illness or injury, pt requires aid of supportive devices such as crutches, cane, wheelchairs, walkers, the use of special transportation or the assistance of another person to leave their place of residence. There is a normal inability to leave the home and doing so requires considerable and taxing effort. Other absences are for medical reasons / religious services and are infrequent or of short duration when for other reasons). o If current dressing causes regression in wound condition, may D/C ordered dressing product/s and apply Normal Saline Moist Dressing daily until next [place] / Other MD appointment. [place] of regression in wound condition at [phone]. o Please direct any NON-WOUND related issues/requests for orders to patient's Primary Care Physician Patient Medications Allergies: penicillin Notifications Medication Indication Start End lidocaine DOSE 1 - topical 4 % cream - 1 cream topical Electronic Signature(s) Signed: 07/08/2018 1:39:21 PM By: Worthy Keeler PA-C Signed: 07/08/2018 5:07:56 PM By: Alric Quan Entered By: Alric Quan on 07/07/2018 11:26:15 [name], [name] ([id number]) -------------------------------------------------------------------------------- Prescription 07/07/2018 Patient Name: [name] Provider: Worthy Keeler PA-C Date of Birth: March 18, 1951 NPI#: [id number] Sex: F DEA#:  [id number] Phone #: [phone] License #: Patient Address: [place] [address] [place], [place] [address] [place], [place] [address] [phone] Allergies penicillin Medication Medication: Route: Strength: Form: lidocaine topical 4% cream Class: TOPICAL LOCAL ANESTHETICS Dose: Frequency / Time: Indication: 1 1 cream topical Number of Refills: Number of Units: 0 Generic Substitution: Start Date: End Date: Administered at Substitution Permitted Facility: Yes Time Administered: Time Discontinued: Note to Pharmacy: Signature(s): Date(s): Electronic Signature(s) Signed: 07/08/2018 1:39:21 PM By: Worthy Keeler PA-C Signed: 07/08/2018 5:07:56 PM By: Alric Quan Entered By: Alric Quan on 07/07/2018 11:26:17 [name], [name] ([id number], WEIHER ([id number]) --------------------------------------------------------------------------------  Problem List Details Patient Name: [name] Date of Service: 07/07/2018 10:15 AM Medical Record Number: [id number] Patient Account Number: [id number] Date of Birth/Sex: 15-Mar-1951 (67 y.o. F) Treating RN: Ahmed Prima Primary Care Provider: SYSTEM, PCP Other Clinician: Referring Provider: Irene Limbo Treating Provider/Extender: Melburn Hake, Azariah Bonura Weeks in Treatment: 2 Active Problems ICD-10 Evaluated Encounter Code Description Active Date Today Diagnosis I89.0 Lymphedema, not elsewhere classified 06/22/2018 No Yes E11.42 Type 2 diabetes mellitus with diabetic polyneuropathy 06/22/2018 No Yes I10 Essential (primary) hypertension 06/22/2018 No Yes Inactive Problems Resolved Problems Electronic Signature(s) Signed: 07/08/2018 1:39:21 PM By: Worthy Keeler PA-C Entered By: Worthy Keeler on 07/07/2018 10:00:13 [name]  ([id number]) -------------------------------------------------------------------------------- Progress Note Details Patient Name: [name] Date of Service: 07/07/2018 10:15 AM Medical Record Number: [id number] Patient Account Number: [id number] Date of Birth/Sex: 1951/07/05 (67 y.o. 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 Fortunately she  does not have any active open wounds at this point in time. Unfortunately this has apparently been a scenario that comes and goes quite frequently due to the lymphedema that she's had present for quite a number of years. She does have some difficulty apparently wearing standard compression stockings and may need custom compression in order to correctly manage this. She does have lymphedema pumps which are at home in her apartment although she no longer lives there and will not be going back there she is currently residing in assisted living. Nonetheless she has not been able to use her pumps for quite some time as she has not been able to apparently accept them due to a very complicated scenario that I do not fully understand. Nonetheless she states that she's gonna have to write some kind a letter to the owner of the property till our friend to come in and remove her belonging and get them to her. I believe she needs to do this sooner rather than later to get her compression pumps. Nonetheless otherwise she seems to be doing fairly well all things considered and she did have Unna Boot wraps up on evaluation  today. 06/30/18 on evaluation today patient actually appears to be doing fairly well in regard to her bilateral lower extremities. She continues to have significant bilateral lymphedema she does have two small blisters which are not open in regard to left lower extremity but in general this is doing excellent. She actually is about to get her lymphedema pumps that she's actually receiving the new sleeves in the next two days which I think will be of benefit. We had a discussion about compression therapy and what she needs to use on a daily basis again during the evaluation today. I believe that she really does need to have some kind compression she's unable to slide on compression stockings if nothing else we will probably need to see about an ace wrap. 07/07/18 on evaluation today patient actually appears to be doing very well in regard to her bilateral lower extremities. She does have one area of blistering which opened on the left interior shin but this is fairly superficial and does not appear to be significant issue is mainly part of the lymphedema scenario. Nonetheless she has gotten her lymphedema pumps to her new residence and up and running and currently she states that she began using them yesterday. Fortunately there does not appear to be any evidence of infection at this time No fevers, chills, nausea, or vomiting noted at this time. Patient History Information obtained from Patient. Family History Diabetes - Mother, Heart Disease - Mother, Hypertension - Mother, No family history of Cancer, Kidney Disease, Lung Disease, Seizures, Stroke, Thyroid Problems, Tuberculosis. Social History Never smoker, Marital Status - Single, Alcohol Use - Never, Drug Use - Prior History, Caffeine Use - Daily. Medical And Surgical History Notes Genitourinary Foley Catheter [name], [name] ([id number]) Review of Systems (ROS) Constitutional Symptoms (General Health) Denies complaints or symptoms of  Fever, Chills. Respiratory The patient has no complaints or symptoms. Cardiovascular The patient has no complaints or symptoms. Psychiatric The patient has no complaints or symptoms. Objective Constitutional Well-nourished and well-hydrated in no acute distress. Vitals Time Taken: 10:38 AM, Height: 64 in, Temperature: 98.2 F, Pulse: 85 bpm, Respiratory Rate: 16 breaths/min, Blood Pressure: 130/41 mmHg. Respiratory normal breathing without difficulty. clear to auscultation bilaterally. Cardiovascular 1+ pitting edema of the bilateral lower extremities. Psychiatric this patient is able to make decisions and demonstrates good  insight into disease process. Alert and Oriented x 3. pleasant and cooperative. General Notes: Patient's wound at this point appears to be very superficial for swelling is doing fairly well and I do think she's tolerating the wraps. I'm in a recommend that we continue the wraps for the next week while she begins and continues to use the lymphedema pumps. Hopefully we will be able to discharge her shortly. Integumentary (Hair, Skin) Wound #1 status is Open. Original cause of wound was Gradually Appeared. The wound is located on the Left,Medial Lower Leg. The wound measures 2cm length x 4.5cm width x 0.1cm depth; 7.069cm^2 area and 0.707cm^3 volume. There is no tunneling or undermining noted. There is a medium amount of serous drainage noted. There is large (67-100%) red granulation within the wound bed. There is no necrotic tissue within the wound bed. The periwound skin appearance did not exhibit: Callus, Crepitus, Excoriation, Induration, Rash, Scarring, Dry/Scaly, Maceration, Atrophie Blanche, Cyanosis, Ecchymosis, Hemosiderin Staining, Mottled, Pallor, Rubor, Erythema. Assessment Active Problems ICD-10 [name], [name] ([id number]) Lymphedema, not elsewhere classified Type 2 diabetes mellitus with diabetic polyneuropathy Essential (primary)  hypertension Plan Wound Cleansing: Clean wound with Normal Saline. Cleanse wound with mild soap and water Skin Barriers/Peri-Wound Care: Moisturizing lotion Primary Wound Dressing: Wound #1 Left,Medial Lower Leg: Silver Alginate Secondary Dressing: Wound #1 Left,Medial Lower Leg: ABD pad Dressing Change Frequency: Other: - Tuesdays and Fridays [facility] to change on Fridays and pt will be seen on Tuesdays at the [facility] Follow-up Appointments: Return Appointment in 1 week. Nurse Visit as needed Edema Control: 3 Layer Compression System - Bilateral - wraps need to be 2 finger widths below the toes up to 2 to 3 finger widths below knee DO NOT DO UNNA BOOTS THIS IS A 3-LAYER WRAP ONLY TO BILATERAL LEGS Elevate legs to the level of the heart and pump ankles as often as possible Compression Pump: Use compression pump on left lower extremity for 30 minutes, twice daily. - use one hour twice daily Compression Pump: Use compression pump on right lower extremity for 30 minutes, twice daily. - use one hour twice daily Other: - HHRN please order velcro compression wrap for left leg Off-Loading: Turn and reposition every 2 hours Other: - elevate lower extremities to assist with edema control in lower legs Additional Orders / Instructions: Vitamin A; Vitamin C, Zinc Increase protein intake. Home Health: [place] Nurse may visit PRN to address patient s wound care needs. FACE TO FACE ENCOUNTER: MEDICARE and MEDICAID PATIENTS: I certify that this patient is under my care and that I had a face-to-face encounter that meets the physician face-to-face encounter requirements with this patient on this date. The encounter with the patient was in whole or in part for the following MEDICAL CONDITION: (primary reason for [place]) MEDICAL NECESSITY: I certify, that based on my findings, NURSING services are a medically necessary home health service. HOME BOUND  STATUS: I certify that my clinical findings support that this patient is homebound (i.e., Due to illness or injury, pt requires aid of supportive devices such as crutches, cane, wheelchairs, walkers, the use of special transportation or the assistance of another person to leave their place of residence. There is a normal inability to leave the home and doing so requires considerable and taxing effort. Other absences are for medical reasons / religious services and are infrequent or of short duration when for other reasons). If current dressing causes regression in wound condition, may  D/C ordered dressing product/s and apply Normal Saline Moist Dressing daily until next [place] / Other MD appointment. [place] of regression in wound condition at [phone]. Please direct any NON-WOUND related issues/requests for orders to patient's Primary Care Physician The following medication(s) was prescribed: lidocaine topical 4 % cream 1 1 cream topical was prescribed at facility [place], [place]. ([id number]) We are gonna initiate and continue the above wound care orders over the next week. Subsequently we will see were things stand at follow-up. I do believe that the patient needs some type of compression since she is currently experiencing open wound on the left lower extremity I'm gonna see about ordering a Juxta-Lite compression wrap to see if this could be of benefit for her as well especially in regards the left lower extremity which is much more difficult to get a compression sock on. Subsequently we will see her back for reevaluation. Please see above for specific wound care orders. We will see patient for re-evaluation in 1 week(s) here in the clinic. If anything worsens or changes patient will contact our office for additional recommendations. Electronic Signature(s) Signed: 07/08/2018 1:39:21 PM By: Worthy Keeler PA-C Entered By: Worthy Keeler on 07/08/2018  13:19:00 [name] ([id number]) -------------------------------------------------------------------------------- ROS/PFSH Details Patient Name: [name] Date of Service: 07/07/2018 10:15 AM Medical Record Number: [id number] Patient Account Number: [id number] Date of Birth/Sex: 1951-02-19 (67 y.o. F) Treating RN: Ahmed Prima Primary Care Provider: SYSTEM, PCP Other Clinician: Referring Provider: Irene Limbo Treating Provider/Extender: STONE III, Maxine Huynh Weeks in Treatment: 2 Information Obtained From Patient Wound History Do you currently have one or more open woundso Yes Have you had any tests for circulation on your legso No Constitutional Symptoms (General Health) Complaints and Symptoms: Negative for: Fever; Chills Eyes Medical History: Positive for: Cataracts - removed in 2009 Negative for: Glaucoma; Optic Neuritis Ear/Nose/Mouth/Throat Medical History: Negative for: Chronic sinus problems/congestion; Middle ear problems Hematologic/Lymphatic Medical History: Positive for: Lymphedema - bilateral Negative for: Anemia; Hemophilia; Human Immunodeficiency Virus; Sickle Cell Disease Respiratory Complaints and Symptoms: No Complaints or Symptoms Medical History: Positive for: Asthma Negative for: Aspiration; Chronic Obstructive Pulmonary Disease (COPD); Pneumothorax; Sleep Apnea; Tuberculosis Cardiovascular Complaints and Symptoms: No Complaints or Symptoms Medical History: Positive for: Hypertension; Peripheral Venous Disease Negative for: Angina; Arrhythmia; Congestive Heart Failure; Coronary Artery Disease; Deep Vein Thrombosis; Hypotension; Myocardial Infarction; Peripheral Arterial Disease; Phlebitis; Vasculitis Gastrointestinal [name], [name]. ([id number]) Medical History: Negative for: Cirrhosis ; Colitis; Crohnos; Hepatitis A; Hepatitis B; Hepatitis C Endocrine Medical History: Positive for: Type II Diabetes Negative for: Type I Diabetes Time  with diabetes: 10 plus Treated with: Insulin Blood sugar tested every day: No Genitourinary Medical History: Negative for: End Stage Renal Disease Past Medical History Notes: Foley Catheter Immunological Medical History: Negative for: Lupus Erythematosus; Raynaudos; Scleroderma Integumentary (Skin) Medical History: Negative for: History of Burn; History of pressure wounds Musculoskeletal Medical History: Negative for: Gout; Rheumatoid Arthritis; Osteoarthritis; Osteomyelitis Neurologic Medical History: Positive for: Neuropathy Negative for: Dementia; Quadriplegia; Paraplegia; Seizure Disorder Oncologic Medical History: Negative for: Received Chemotherapy; Received Radiation Psychiatric Complaints and Symptoms: No Complaints or Symptoms Medical History: Negative for: Anorexia/bulimia; Confinement Anxiety HBO Extended History Items Eyes: Cataracts Immunizations [name], [name] ([id number]) Pneumococcal Vaccine: Received Pneumococcal Vaccination: Yes Implantable Devices Family and Social History Cancer: No; Diabetes: Yes - Mother; Heart Disease: Yes - Mother; Hypertension: Yes - Mother; Kidney Disease: No; Lung Disease: No; Seizures: No; Stroke: No; Thyroid Problems: No; Tuberculosis: No; Never  smoker; Marital Status - Single; Alcohol Use: Never; Drug Use: Prior History; Caffeine Use: Daily; Living Will: Yes (Not Provided); Medical Power of Attorney: Yes (Not Provided) Physician Affirmation I have reviewed and agree with the above information. Electronic Signature(s) Signed: 07/08/2018 1:39:21 PM By: Worthy Keeler PA-C Signed: 07/08/2018 5:07:56 PM By: Alric Quan Entered By: Worthy Keeler on 07/08/2018 13:18:14 Cancel, [name] ([id number]) -------------------------------------------------------------------------------- SuperBill Details Patient Name: [name] Date of Service: 07/07/2018 Medical Record Number: [id number] Patient Account Number:  [id number] Date of Birth/Sex: [birth year] (67 y.o. F) Treating RN: Ahmed Prima Primary Care Provider: SYSTEM, PCP Other Clinician: Referring Provider: Irene Limbo Treating Provider/Extender: STONE III, Elster Corbello Weeks in Treatment: 2 Diagnosis Coding ICD-10 Codes Code Description I89.0 Lymphedema, not elsewhere classified E11.42 Type 2 diabetes mellitus with diabetic polyneuropathy I10 Essential (primary) hypertension Facility Procedures CPT4: Description Modifier Quantity Code [id number] BILATERAL: Application of multi-layer venous compression system; leg (below 1 knee), including ankle and foot. Physician Procedures CPT4 Code: 2174715 Description: 95396 - WC PHYS LEVEL 3 - EST PT ICD-10 Diagnosis Description I89.0 Lymphedema, not elsewhere classified E11.42 Type 2 diabetes mellitus with diabetic polyneuropathy I10 Essential (primary) hypertension Modifier: Quantity: 1 Electronic Signature(s) Signed: 07/08/2018 1:39:21 PM By: Worthy Keeler PA-C Previous Signature: 07/07/2018 5:24:34 PM Version By: Alric Quan Entered By: Worthy Keeler on 07/08/2018 13:19:29

## 2018-07-09 NOTE — Progress Notes (Signed)
JOANI, COSMA ([id number]) Visit Report for 07/07/2018 Arrival Information Details Patient Name: [name], [name] Date of Service: 07/07/2018 10:15 AM Medical Record Number: [id number] Patient Account Number: [id number] Date of Birth/Sex: 15-Dec-1951 (66 y.o. F) Treating RN: Roger Shelter Primary Care Nyna Chilton: SYSTEM, PCP Other Clinician: Referring Ioma Chismar: Irene Limbo Treating Ameirah Khatoon/Extender: Melburn Hake, HOYT Weeks in Treatment: 2 Visit Information History Since Last Visit All ordered tests and consults were completed: No Patient Arrived: Walker Added or deleted any medications: No Arrival Time: 10:34 Any new allergies or adverse reactions: No Accompanied By: self Had a fall or experienced change in No Transfer Assistance: None activities of daily living that may affect Patient Identification Verified: Yes risk of falls: Secondary Verification Process Completed: Yes Signs or symptoms of abuse/neglect since last visito No Patient Has Alerts: Yes Hospitalized since last visit: No Patient Alerts: Type II Diabetic Implantable device outside of the clinic excluding No cellular tissue based products placed in the center since last visit: Pain Present Now: Yes Electronic Signature(s) Signed: 07/07/2018 4:58:15 PM By: Roger Shelter Entered By: Roger Shelter on 07/07/2018 10:37:33 [name] ([id number]) -------------------------------------------------------------------------------- Encounter Discharge Information Details Patient Name: [name] Date of Service: 07/07/2018 10:15 AM Medical Record Number: [id number] Patient Account Number: [id number] Date of Birth/Sex: 1951/12/16 (66 y.o. F) Treating RN: Roger Shelter Primary Care Rosabelle Jupin: SYSTEM, PCP Other Clinician: Referring Seanpatrick Maisano: Irene Limbo Treating Tunisha Ruland/Extender: Melburn Hake, HOYT Weeks in Treatment: 2 Encounter Discharge Information Items Discharge Condition: Stable Ambulatory  Status: Ambulatory Discharge Destination: Home Transportation: Private Auto Schedule Follow-up Appointment: Yes Clinical Summary of Care: Electronic Signature(s) Signed: 07/07/2018 4:58:15 PM By: Roger Shelter Entered By: Roger Shelter on 07/07/2018 11:41:19 [name] ([id number]) -------------------------------------------------------------------------------- Lower Extremity Assessment Details Patient Name: [name] Date of Service: 07/07/2018 10:15 AM Medical Record Number: [id number] Patient Account Number: [id number] Date of Birth/Sex: 03/11/1951 (66 y.o. F) Treating RN: Roger Shelter Primary Care Carmencita Cusic: SYSTEM, PCP Other Clinician: Referring Nolton Denis: Irene Limbo Treating Diego Delancey/Extender: STONE III, HOYT Weeks in Treatment: 2 Edema Assessment Assessed: [Left: No] [Right: No] Edema: [Left: Yes] [Right: Yes] Calf Left: Right: Point of Measurement: 32 cm From Medial Instep 51.8 cm 40.5 cm Ankle Left: Right: Point of Measurement: 12 cm From Medial Instep 30.2 cm 25 cm Vascular Assessment Claudication: Claudication Assessment [Left:None] [Right:None] Pulses: Dorsalis Pedis Palpable: [Left:Yes] [Right:Yes] Posterior Tibial Extremity colors, hair growth, and conditions: Extremity Color: [Left:Hyperpigmented] [Right:Hyperpigmented] Hair Growth on Extremity: [Left:No] [Right:No] Temperature of Extremity: [Left:Warm] [Right:Warm] Capillary Refill: [Left:> 3 seconds] [Right:> 3 seconds] Toe Nail Assessment Left: Right: Thick: Yes Yes Discolored: Yes Yes Deformed: Yes Yes Improper Length and Hygiene: Yes Yes Electronic Signature(s) Signed: 07/07/2018 4:58:15 PM By: Roger Shelter Entered By: Roger Shelter on 07/07/2018 10:52:32 [name] ([id number]) -------------------------------------------------------------------------------- Multi Wound Chart Details Patient Name: [name] Date of Service: 07/07/2018 10:15 AM Medical  Record Number: [id number] Patient Account Number: [id number] Date of Birth/Sex: 1950-12-31 (66 y.o. F) Treating RN: Ahmed Prima Primary Care Conner Neiss: SYSTEM, PCP Other Clinician: Referring Braycen Burandt: Irene Limbo Treating Jaidin Richison/Extender: STONE III, HOYT Weeks in Treatment: 2 Vital Signs Height(in): 65 Pulse(bpm): 43 Weight(lbs): Blood Pressure(mmHg): 130/41 Body Mass Index(BMI): Temperature(F): 98.2 Respiratory Rate 16 (breaths/min): Photos: [1:No Photos] [N/A:N/A] Wound Location: [1:Left Lower Leg - Medial] [N/A:N/A] Wounding Event: [1:Gradually Appeared] [N/A:N/A] Primary Etiology: [1:Lymphedema] [N/A:N/A] Comorbid History: [1:Cataracts, Lymphedema, Asthma, Hypertension, Peripheral Venous Disease, Type II Diabetes, Neuropathy] [N/A:N/A] Date Acquired: [1:07/07/2018] [N/A:N/A] Weeks of Treatment: [1:0] [N/A:N/A] Wound Status: [1:Open] [N/A:N/A] Measurements L x  W x D [1:2x4.5x0.1] [N/A:N/A] (cm) Area (cm) : [1:7.069] [N/A:N/A] Volume (cm) : [1:0.707] [N/A:N/A] Classification: [1:Partial Thickness] [N/A:N/A] Exudate Amount: [1:Medium] [N/A:N/A] Exudate Type: [1:Serous] [N/A:N/A] Exudate Color: [1:amber] [N/A:N/A] Granulation Amount: [1:Large (67-100%)] [N/A:N/A] Granulation Quality: [1:Red] [N/A:N/A] Necrotic Amount: [1:None Present (0%)] [N/A:N/A] Exposed Structures: [1:Fascia: No Fat Layer (Subcutaneous Tissue) Exposed: No Tendon: No Muscle: No Joint: No Bone: No] [N/A:N/A] Epithelialization: [1:None] [N/A:N/A] Periwound Skin Texture: [1:Excoriation: No Induration: No Callus: No Crepitus: No Rash: No Scarring: No] [N/A:N/A] Periwound Skin Moisture: [N/A:N/A] Maceration: No Dry/Scaly: No Periwound Skin Color: Atrophie Blanche: No N/A N/A Cyanosis: No Ecchymosis: No Erythema: No Hemosiderin Staining: No Mottled: No Pallor: No Rubor: No Tenderness on Palpation: No N/A N/A Wound Preparation: Ulcer Cleansing: N/A N/A Rinsed/Irrigated with  Saline Topical Anesthetic Applied: None Treatment Notes Electronic Signature(s) Signed: 07/08/2018 5:07:56 PM By: Alric Quan Entered By: Alric Quan on 07/07/2018 11:22:26 [name] ([id number]) -------------------------------------------------------------------------------- Multi-Disciplinary Care Plan Details Patient Name: [name] Date of Service: 07/07/2018 10:15 AM Medical Record Number: [id number] Patient Account Number: [id number] Date of Birth/Sex: 1951/08/27 (66 y.o. F) Treating RN: Ahmed Prima Primary Care Rikayla Demmon: SYSTEM, PCP Other Clinician: Referring Valdis Bevill: Irene Limbo Treating Grecia Lynk/Extender: STONE III, HOYT Weeks in Treatment: 2 Active Inactive ` Orientation to the Wound Care Program Nursing Diagnoses: Knowledge deficit related to the wound healing center program Goals: Patient/caregiver will verbalize understanding of the [facility] Program Date Initiated: 06/22/2018 Target Resolution Date: 07/20/2018 Goal Status: Active Interventions: Provide education on orientation to the wound center Notes: ` Wound/Skin Impairment Nursing Diagnoses: Impaired tissue integrity Goals: Patient/caregiver will verbalize understanding of skin care regimen Date Initiated: 06/22/2018 Target Resolution Date: 07/20/2018 Goal Status: Active Ulcer/skin breakdown will have a volume reduction of 30% by week 4 Date Initiated: 06/22/2018 Target Resolution Date: 07/20/2018 Goal Status: Active Interventions: Assess patient/caregiver ability to obtain necessary supplies Assess patient/caregiver ability to perform ulcer/skin care regimen upon admission and as needed Assess ulceration(s) every visit Treatment Activities: Skin care regimen initiated : 06/22/2018 Notes: Electronic Signature(s) Signed: 07/08/2018 5:07:56 PM By: Lucila Maine, Garvin Fila ([id number]) Entered By: Alric Quan on 07/07/2018 11:22:14 [name]  ([id number]) -------------------------------------------------------------------------------- Pain Assessment Details Patient Name: [name] Date of Service: 07/07/2018 10:15 AM Medical Record Number: [id number] Patient Account Number: [id number] Date of Birth/Sex: 1951-06-05 (66 y.o. F) Treating RN: Roger Shelter Primary Care Polly Barner: SYSTEM, PCP Other Clinician: Referring Kym Scannell: Irene Limbo Treating Meleah Demeyer/Extender: STONE III, HOYT Weeks in Treatment: 2 Active Problems Location of Pain Severity and Description of Pain Patient Has Paino No Site Locations Duration of the Pain. Constant / Intermittento Constant Rate the pain. Current Pain Level: 6 Character of Pain Describe the Pain: Burning Pain Management and Medication Current Pain Management: Electronic Signature(s) Signed: 07/07/2018 4:58:15 PM By: Roger Shelter Entered By: Roger Shelter on 07/07/2018 10:37:59 [name] ([id number]) -------------------------------------------------------------------------------- Patient/Caregiver Education Details Patient Name: [name] Date of Service: 07/07/2018 10:15 AM Medical Record Number: [id number] Patient Account Number: [id number] Date of Birth/Gender: 05-10-1951 (66 y.o. F) Treating RN: Roger Shelter Primary Care Physician: SYSTEM, PCP Other Clinician: Referring Physician: Irene Limbo Treating Physician/Extender: Sharalyn Ink in Treatment: 2 Education Assessment Education Provided To: Patient and Caregiver Education Topics Provided Wound/Skin Impairment: Handouts: Caring for Your Ulcer Methods: Explain/Verbal Responses: State content correctly Electronic Signature(s) Signed: 07/07/2018 4:58:15 PM By: Roger Shelter Entered By: Roger Shelter on 07/07/2018 11:41:30 Verdone, Garvin Fila ([id number]) -------------------------------------------------------------------------------- Wound Assessment Details Patient Name:  [name] Date  of Service: 07/07/2018 10:15 AM Medical Record Number: [id number] Patient Account Number: [id number] Date of Birth/Sex: [birth year] (66 y.o. F) Treating RN: Roger Shelter Primary Care Jadd Gasior: SYSTEM, PCP Other Clinician: Referring Colie Fugitt: Irene Limbo Treating Alayasia Breeding/Extender: STONE III, HOYT Weeks in Treatment: 2 Wound Status Wound Number: 1 Primary Lymphedema Etiology: Wound Location: Left Lower Leg - Medial Wound Open Wounding Event: Gradually Appeared Status: Date Acquired: 07/07/2018 Comorbid Cataracts, Lymphedema, Asthma, Weeks Of Treatment: 0 History: Hypertension, Peripheral Venous Disease, Type Clustered Wound: No II Diabetes, Neuropathy Photos Photo Uploaded By: Roger Shelter on 07/07/2018 17:00:35 Wound Measurements Length: (cm) 2 Width: (cm) 4.5 Depth: (cm) 0.1 Area: (cm) 7.069 Volume: (cm) 0.707 % Reduction in Area: % Reduction in Volume: Epithelialization: None Tunneling: No Undermining: No Wound Description Classification: Partial Thickness Exudate Amount: Medium Exudate Type: Serous Exudate Color: amber Foul Odor After Cleansing: No Slough/Fibrino No Wound Bed Granulation Amount: Large (67-100%) Exposed Structure Granulation Quality: Red Fascia Exposed: No Necrotic Amount: None Present (0%) Fat Layer (Subcutaneous Tissue) Exposed: No Tendon Exposed: No Muscle Exposed: No Joint Exposed: No Bone Exposed: No Periwound Skin Texture Texture Color Bieri, Wilhemina K. ([id number]) No Abnormalities Noted: No No Abnormalities Noted: No Callus: No Atrophie Blanche: No Crepitus: No Cyanosis: No Excoriation: No Ecchymosis: No Induration: No Erythema: No Rash: No Hemosiderin Staining: No Scarring: No Mottled: No Pallor: No Moisture Rubor: No No Abnormalities Noted: No Dry / Scaly: No Maceration: No Wound Preparation Ulcer Cleansing: Rinsed/Irrigated with Saline Topical Anesthetic Applied: None Treatment  Notes Wound #1 (Left, Medial Lower Leg) 1. Cleansed with: Clean wound with Normal Saline 2. Anesthetic Topical Lidocaine 4% cream to wound bed prior to debridement 3. Peri-wound Care: Moisturizing lotion 4. Dressing Applied: Other dressing (specify in notes) 5. Secondary Dressing Applied ABD Pad 7. Secured with 3 Layer Compression System - Bilateral Notes silvercell, unna boot to Engineer, production) Signed: 07/07/2018 4:58:15 PM By: Roger Shelter Entered By: Roger Shelter on 07/07/2018 10:50:17 SHARLETT, LIENEMANN ([id number]) -------------------------------------------------------------------------------- Vitals Details Patient Name: [name] Date of Service: 07/07/2018 10:15 AM Medical Record Number: [id number] Patient Account Number: [id number] Date of Birth/Sex: Mar 10, 1951 (66 y.o. F) Treating RN: Roger Shelter Primary Care Jamarius Saha: SYSTEM, PCP Other Clinician: Referring Kerrington Sova: Irene Limbo Treating Rosalio Catterton/Extender: STONE III, HOYT Weeks in Treatment: 2 Vital Signs Time Taken: 10:38 Temperature (F): 98.2 Height (in): 64 Pulse (bpm): 85 Respiratory Rate (breaths/min): 16 Blood Pressure (mmHg): 130/41 Reference Range: 80 - 120 mg / dl Electronic Signature(s) Signed: 07/07/2018 4:58:15 PM By: Roger Shelter Entered By: Roger Shelter on 07/07/2018 10:38:19

## 2018-07-14 ENCOUNTER — Encounter: Payer: Medicare Other | Admitting: Physician Assistant

## 2018-07-14 DIAGNOSIS — I890 Lymphedema, not elsewhere classified: Secondary | ICD-10-CM | POA: Diagnosis not present

## 2018-07-14 NOTE — Progress Notes (Signed)
07/15/2018 9:35 AM   [name] 27-Jun-1951 [id number]  Referring provider: Center, [facility] [place], [place] [address]  Chief Complaint  Patient presents with  . Follow-up    CT results    HPI: Patient is a 67 year old African American female with uncontrolled DM, lymphadenitits, cellulitis and bilateral hydronephrosis who presents today to discuss her RUS and CTU results.  Background history  Patient is a 67 year old female with a history of uncontrolled diabetes, lymphadenitis and was recently admitted for cellulitis and was found to have bilateral hydronephrosis and urinary retention and Foley catheter was placed with her transportation specialist, Klemme.   Her Foley was placed on March 30, 2018 after a renal ultrasound was performed due to increasing serum creatinine levels.  PVR was > 1500cc.  RUS on 03/30/2018 noted mild right-sided and moderate left-sided hydronephrosis.  She was initiated on tamsulosin 0.4 mg daily. Foley catheter was exchanged on 06/23/2018.  She denies any difficulty with urination prior to her hospital admission.  Patient denies any gross hematuria, dysuria or suprapubic/flank pain.  Patient denies any fevers, chills, nausea or vomiting.     RUS on 06/23/2018 noted interval resolution of right-sided hydronephrosis. Persistent moderate left-sided hydronephrosis.  Partial distention of the urinary bladder with a Foley catheter present.    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  There is a moderate to marked left-sided hydronephrosis to the level of the UPJ. No stone or mass noted. 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  Right-sided pelvocaliectasis without evidence for right-sided obstructive uropathy.  Aortic Atherosclerosis.  Bilateral inguinal adenopathy. Etiology indeterminate.  Differential considerations include reactive adenopathy versus metastatic disease or lymphoproliferative disorder.  Today,  she is wanting the Foley out.  Patient denies any gross hematuria, dysuria or suprapubic/flank pain.  Patient denies any fevers, chills, nausea or vomiting.     PMH: Past Medical History:  Diagnosis Date  . Asthma   . Diabetes mellitus without complication ([place])   . Glaucoma   . HLD (hyperlipidemia)   . Hypertension     Surgical History: Past Surgical History:  Procedure Laterality Date  . APPENDECTOMY    . TONSILLECTOMY      Home Medications:  Allergies as of 07/15/2018      Reactions   Penicillins Swelling   Has patient had a PCN reaction causing immediate rash, facial/tongue/throat swelling, SOB or lightheadedness with hypotension: Unknown Has patient had a PCN reaction causing severe rash involving mucus membranes or skin necrosis: Unknown Has patient had a PCN reaction that required hospitalization: Unknown Has patient had a PCN reaction occurring within the last 10 years: Unknown If all of the above answers are "NO", then may proceed with Cephalosporin use.      Medication List        Accurate as of 07/15/18  9:35 AM. Always use your most recent med list.          acetaminophen 325 MG tablet Commonly known as:  TYLENOL Take 650 mg by mouth every 6 (six) hours as needed.   D3-50 50000 units capsule Generic drug:  Cholecalciferol Take 50,000 Units by mouth once a week.   dronabinol 2.5 MG capsule Commonly known as:  MARINOL Take 1 capsule (2.5 mg total) by mouth 2 (two) times daily before lunch and supper.   ferrous sulfate 325 (65 FE) MG tablet Take 325 mg by mouth daily with breakfast.   gabapentin 100 MG capsule Commonly known  as:  NEURONTIN Take 200 mg by mouth 3 (three) times daily.   glipiZIDE 5 MG tablet Commonly known as:  GLUCOTROL Take by mouth daily before breakfast.   levothyroxine 125 MCG tablet Commonly known as:  SYNTHROID, LEVOTHROID Take 1 tablet (125 mcg total) by mouth daily before breakfast.   NOVOLIN 70/30 (70-30) 100 UNIT/ML  injection Generic drug:  insulin NPH-regular Human Inject into the skin.   tamsulosin 0.4 MG Caps capsule Commonly known as:  FLOMAX Take 1 capsule (0.4 mg total) by mouth daily.       Allergies:  Allergies  Allergen Reactions  . Penicillins Swelling    Has patient had a PCN reaction causing immediate rash, facial/tongue/throat swelling, SOB or lightheadedness with hypotension: Unknown Has patient had a PCN reaction causing severe rash involving mucus membranes or skin necrosis: Unknown Has patient had a PCN reaction that required hospitalization: Unknown Has patient had a PCN reaction occurring within the last 10 years: Unknown If all of the above answers are "NO", then may proceed with Cephalosporin use.     Family History: Family History  Problem Relation Age of Onset  . Diabetes Mother   . Diabetes Father   . Breast cancer Neg Hx     Social History:  reports that she has quit smoking. She has never used smokeless tobacco. She reports that she does not drink alcohol or use drugs.  ROS: UROLOGY Frequent Urination?: No Hard to postpone urination?: No Burning/pain with urination?: No Get up at night to urinate?: No Leakage of urine?: No Urine stream starts and stops?: No Trouble starting stream?: No Do you have to strain to urinate?: No Blood in urine?: No Urinary tract infection?: No Sexually transmitted disease?: No Injury to kidneys or bladder?: No Painful intercourse?: No Weak stream?: No Currently pregnant?: No Vaginal bleeding?: No Last menstrual period?: n  Gastrointestinal Nausea?: No Vomiting?: No Indigestion/heartburn?: No Diarrhea?: No Constipation?: No  Constitutional Fever: No Night sweats?: No Weight loss?: No Fatigue?: No  Skin Skin rash/lesions?: No Itching?: No  Eyes Blurred vision?: No Double vision?: No  Ears/Nose/Throat Sore throat?: No Sinus problems?: No  Hematologic/Lymphatic Swollen glands?: No Easy bruising?:  No  Cardiovascular Leg swelling?: No Chest pain?: No  Respiratory Cough?: No Shortness of breath?: No  Endocrine Excessive thirst?: No  Musculoskeletal Back pain?: No Joint pain?: No  Neurological Headaches?: No Dizziness?: No  Psychologic Depression?: No Anxiety?: No  Physical Exam: BP 111/72 (BP Location: Left Arm, Patient Position: Sitting, Cuff Size: Normal)   Pulse 91   Ht 5\' 4"  (1.626 m)   Wt 186 lb 8 oz (84.6 kg)   LMP  (LMP Unknown)   BMI 32.01 kg/m   Constitutional:  Well nourished. Alert and oriented, No acute distress. HEENT: [place] AT, moist mucus membranes.  Trachea midline, no masses. Cardiovascular: No clubbing, cyanosis, or edema. Respiratory: Normal respiratory effort, no increased work of breathing. GI: Abdomen is soft, non tender, non distended, no abdominal masses. Liver and spleen not palpable.  No hernias appreciated.  Stool sample for occult testing is not indicated.   GU: No CVA tenderness.  No bladder fullness or masses.   Skin: No rashes, bruises or suspicious lesions. Lymph: No cervical or inguinal adenopathy.  Both legs wrapped with dressing.  Left leg more edematous than the right.   Neurologic: Grossly intact, no focal deficits, moving all 4 extremities. Psychiatric: Normal mood and affect.  Laboratory Data: Lab Results  Component Value Date   WBC 9.0  04/03/2018   HGB 8.8 (L) 04/03/2018   HCT 26.0 (L) 04/03/2018   MCV 85.5 04/03/2018   PLT 381 04/03/2018    Lab Results  Component Value Date   CREATININE 0.90 07/08/2018    No results found for: PSA  No results found for: TESTOSTERONE  No results found for: HGBA1C  No results found for: TSH  No results found for: CHOL, HDL, CHOLHDL, VLDL, LDLCALC  Lab Results  Component Value Date   AST 15 03/06/2013   Lab Results  Component Value Date   ALT 29 03/06/2013   No components found for: ALKALINEPHOPHATASE No components found for: BILIRUBINTOTAL  No results found for:  ESTRADIOL  Urinalysis    Component Value Date/Time   COLORURINE YELLOW (A) 06/18/2018 1823   APPEARANCEUR CLEAR (A) 06/18/2018 1823   LABSPEC 1.010 06/18/2018 1823   PHURINE 6.0 06/18/2018 1823   GLUCOSEU 50 (A) 06/18/2018 1823   HGBUR SMALL (A) 06/18/2018 1823   BILIRUBINUR NEGATIVE 06/18/2018 1823   KETONESUR NEGATIVE 06/18/2018 1823   PROTEINUR NEGATIVE 06/18/2018 1823   NITRITE POSITIVE (A) 06/18/2018 1823   LEUKOCYTESUR SMALL (A) 06/18/2018 1823    I have reviewed the labs.   Pertinent Imaging: CLINICAL DATA:  Bilateral hydronephrosis, follow-up.  EXAM: RENAL / URINARY TRACT ULTRASOUND COMPLETE  COMPARISON:  Renal ultrasound dated March 30, 2018  FINDINGS: Right Kidney:  Length: 8.6 cm. Echogenicity within normal limits. No mass or hydronephrosis visualized.  Left Kidney:  Length: 10.3 cm. The cortical echotexture is similar to that on the right. There is moderate dilation of the intrarenal collecting system similar to that seen previously.  Bladder:  The urinary bladder is partially distended. A Foley catheter is present.  IMPRESSION: Interval resolution of right-sided hydronephrosis. Persistent moderate left-sided hydronephrosis.  Partial distention of the urinary bladder with a Foley catheter present.   Electronically Signed   By: David  Martinique M.D.   On: 06/23/2018 14:34    CLINICAL DATA:  Hydronephrosis and microscopic hematuria.  EXAM: CT ABDOMEN AND PELVIS WITHOUT AND WITH CONTRAST  TECHNIQUE: Multidetector CT imaging of the abdomen and pelvis was performed following the standard protocol before and following the bolus administration of intravenous contrast.  CONTRAST:  174mL ISOVUE-300 IOPAMIDOL (ISOVUE-300) INJECTION 61%  COMPARISON:  None  FINDINGS: Lower chest: No acute abnormality.  Hepatobiliary: No focal liver abnormality is seen. No gallstones, gallbladder wall thickening, or biliary  dilatation.  Pancreas: Unremarkable. No pancreatic ductal dilatation or surrounding inflammatory changes.  Spleen: Normal in size without focal abnormality.  Adrenals/Urinary Tract: The adrenal glands are normal.  There is a horseshoe kidney. No kidney stones identified. Moderate to marked left-sided hydronephrosis to the level of the left UPJ where there is a high degree luminal narrowing which may reflect congenital or acquired stenosis. Right-sided pelvocaliectasis is identified. On the delayed images there is contrast opacification of bilateral collecting systems. No contrast identified within the left ureter. Contrast opacification of the right ureter is identified to the level of the bladder. Contrast opacification of the urinary bladder noted which contains a Foley catheter.  Stomach/Bowel: Stomach is normal. The small bowel loops have a normal course and caliber without evidence for bowel obstruction. Normal appearance of the colon.  Vascular/Lymphatic: Aortic atherosclerosis. No aneurysm. No abdominal adenopathy. Bilateral inguinal adenopathy is identified. The index right inguinal lymph node measures 1.3 cm, image 83/8. On the left inguinal lymph node measures 2 cm, image 82/8.  Reproductive: Uterus and bilateral adnexa are unremarkable.  Other: No  abdominal wall hernia or abnormality. No abdominopelvic ascites.  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 Degenerative disc disease identified within the lumbar spine.  IMPRESSION: 1. Horseshoe kidney. 2. There is a moderate to marked left-sided hydronephrosis to the level of the UPJ. No stone or mass noted. 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 3. Right-sided pelvocaliectasis without evidence for right-sided obstructive uropathy. 4.  Aortic Atherosclerosis (ICD10-I70.0). 5. Bilateral inguinal adenopathy. Etiology indeterminate. Differential considerations  include reactive adenopathy versus metastatic disease or lymphoproliferative disorder.   Electronically Signed   By: Kerby Moors M.D.   On: 07/08/2018 15:02 I have independently reviewed the films.  Reviewed the report with Dr. Bernardo Heater.   Assessment & Plan:    1. Left-sided hydronephrosis Obtain renal lasix scan to evaluate for obstruction RTC for report  2. Horseshoe kidney Explained findings to the patient  3. Urinary retention Continue Foley catheter at this time to continue bladder decompression Continue tamsulosin 0.4 mg daily   4. Bilateral Hydronephrosis See above  5. Inguinal adenopathy Refer to cancer center for bilateral inguinal adenopathy. Etiology indeterminate.  Differential considerations include reactive adenopathy versus metastatic disease or lymphoproliferative disorder.  Return for Renal lasix scan result and TOV, will need to be an am appointment .  These notes generated with voice recognition software. I apologize for typographical errors.  Zara Council, PA-C  [facility] [address]  [address], [place] [address] [phone]

## 2018-07-15 ENCOUNTER — Encounter: Payer: Self-pay | Admitting: Urology

## 2018-07-15 ENCOUNTER — Ambulatory Visit (INDEPENDENT_AMBULATORY_CARE_PROVIDER_SITE_OTHER): Payer: Medicare Other | Admitting: Urology

## 2018-07-15 VITALS — BP 111/72 | HR 91 | Ht 64.0 in | Wt 186.5 lb

## 2018-07-15 DIAGNOSIS — Q6211 Congenital occlusion of ureteropelvic junction: Secondary | ICD-10-CM

## 2018-07-15 DIAGNOSIS — R590 Localized enlarged lymph nodes: Secondary | ICD-10-CM

## 2018-07-21 ENCOUNTER — Other Ambulatory Visit: Payer: Self-pay

## 2018-07-21 ENCOUNTER — Encounter: Payer: Medicare Other | Admitting: Physician Assistant

## 2018-07-21 ENCOUNTER — Inpatient Hospital Stay: Payer: Medicare Other

## 2018-07-21 ENCOUNTER — Inpatient Hospital Stay: Payer: Medicare Other | Attending: Oncology | Admitting: Oncology

## 2018-07-21 ENCOUNTER — Encounter: Payer: Self-pay | Admitting: Oncology

## 2018-07-21 VITALS — BP 131/82 | HR 77 | Temp 97.8°F | Resp 18 | Wt 187.3 lb

## 2018-07-21 DIAGNOSIS — J45909 Unspecified asthma, uncomplicated: Secondary | ICD-10-CM | POA: Insufficient documentation

## 2018-07-21 DIAGNOSIS — E785 Hyperlipidemia, unspecified: Secondary | ICD-10-CM | POA: Insufficient documentation

## 2018-07-21 DIAGNOSIS — E039 Hypothyroidism, unspecified: Secondary | ICD-10-CM | POA: Diagnosis not present

## 2018-07-21 DIAGNOSIS — Z87891 Personal history of nicotine dependence: Secondary | ICD-10-CM | POA: Diagnosis not present

## 2018-07-21 DIAGNOSIS — I10 Essential (primary) hypertension: Secondary | ICD-10-CM | POA: Diagnosis not present

## 2018-07-21 DIAGNOSIS — R591 Generalized enlarged lymph nodes: Secondary | ICD-10-CM

## 2018-07-21 DIAGNOSIS — Z79899 Other long term (current) drug therapy: Secondary | ICD-10-CM | POA: Diagnosis not present

## 2018-07-21 DIAGNOSIS — E119 Type 2 diabetes mellitus without complications: Secondary | ICD-10-CM | POA: Diagnosis not present

## 2018-07-21 DIAGNOSIS — D649 Anemia, unspecified: Secondary | ICD-10-CM

## 2018-07-21 DIAGNOSIS — I890 Lymphedema, not elsewhere classified: Secondary | ICD-10-CM | POA: Diagnosis not present

## 2018-07-21 DIAGNOSIS — Z794 Long term (current) use of insulin: Secondary | ICD-10-CM | POA: Insufficient documentation

## 2018-07-21 DIAGNOSIS — H409 Unspecified glaucoma: Secondary | ICD-10-CM | POA: Insufficient documentation

## 2018-07-21 LAB — COMPREHENSIVE METABOLIC PANEL
ALBUMIN: 3.1 g/dL — AB (ref 3.5–5.0)
ALT: 25 U/L (ref 0–44)
AST: 25 U/L (ref 15–41)
Alkaline Phosphatase: 107 U/L (ref 38–126)
Anion gap: 7 (ref 5–15)
BUN: 23 mg/dL (ref 8–23)
CHLORIDE: 107 mmol/L (ref 98–111)
CO2: 24 mmol/L (ref 22–32)
Calcium: 9.5 mg/dL (ref 8.9–10.3)
Creatinine, Ser: 1.1 mg/dL — ABNORMAL HIGH (ref 0.44–1.00)
GFR calc Af Amer: 59 mL/min — ABNORMAL LOW (ref >60)
GFR, EST NON AFRICAN AMERICAN: 51 mL/min — AB (ref >60)
Glucose, Bld: 68 mg/dL — ABNORMAL LOW (ref 70–99)
POTASSIUM: 4.1 mmol/L (ref 3.5–5.1)
Sodium: 138 mmol/L (ref 135–145)
Total Bilirubin: 0.4 mg/dL (ref 0.3–1.2)
Total Protein: 8.9 g/dL — ABNORMAL HIGH (ref 6.5–8.1)

## 2018-07-21 LAB — CBC WITH DIFFERENTIAL/PLATELET
Basophils Absolute: 0.1 10*3/uL (ref 0–0.1)
Basophils Relative: 1 %
EOS PCT: 3 %
Eosinophils Absolute: 0.2 10*3/uL (ref 0–0.7)
HEMATOCRIT: 33.2 % — AB (ref 35.0–47.0)
Hemoglobin: 10.9 g/dL — ABNORMAL LOW (ref 12.0–16.0)
LYMPHS ABS: 3.9 10*3/uL — AB (ref 1.0–3.6)
LYMPHS PCT: 48 %
MCH: 27.4 pg (ref 26.0–34.0)
MCHC: 32.8 g/dL (ref 32.0–36.0)
MCV: 83.6 fL (ref 80.0–100.0)
Monocytes Absolute: 0.4 10*3/uL (ref 0.2–0.9)
Monocytes Relative: 5 %
NEUTROS ABS: 3.5 10*3/uL (ref 1.4–6.5)
Neutrophils Relative %: 43 %
PLATELETS: 323 10*3/uL (ref 150–440)
RBC: 3.97 MIL/uL (ref 3.80–5.20)
RDW: 17.3 % — ABNORMAL HIGH (ref 11.5–14.5)
WBC: 8 10*3/uL (ref 3.6–11.0)

## 2018-07-21 LAB — IRON AND TIBC
IRON: 47 ug/dL (ref 28–170)
Saturation Ratios: 21 % (ref 10.4–31.8)
TIBC: 221 ug/dL — ABNORMAL LOW (ref 250–450)
UIBC: 174 ug/dL

## 2018-07-21 LAB — TECHNOLOGIST SMEAR REVIEW

## 2018-07-21 LAB — LACTATE DEHYDROGENASE: LDH: 149 U/L (ref 98–192)

## 2018-07-21 LAB — FERRITIN: Ferritin: 80 ng/mL (ref 11–307)

## 2018-07-21 LAB — FOLATE: Folate: 17.5 ng/mL (ref >5.9)

## 2018-07-21 NOTE — Progress Notes (Signed)
Hematology/Oncology Consult note [facility] [phone] [phone]   Patient Care Team: Patient, No Pcp Per as PCP - General (General Practice) Rico Junker, RN as Registered Nurse Theodore Demark, RN as Registered Nurse  REFERRING PROVIDER: Urology [facility] Parents CHIEF COMPLAINTS/REASON FOR VISIT:  Evaluation of bilaterally inguinal lymphadenopathy.    HISTORY OF PRESENTING ILLNESS:  [name] is a  67 y.o.  female with PMH listed below who was referred to me for evaluation of inguinal lymphadenopathy.  Patient has chronic urinary retention, chronic Foley catheter placement follows up with urology.  CTU on 7 72,019 noted horseshoe kidney.  Moderate to [place] left-sided hydronephrosis.  There is incidental finding of bilateral inguinal adenopathy etiology indeterminate.  She has chronic lower extremity swelling/cellulitis/ulcer secondary to chronic vein insufficiency. Patient denies any fever, unintentional weight loss, night sweating, fever chills.  Reporting feeling well except that" I want to the tube to come out". She lives in a facility.    Review of Systems  Constitutional: Negative for chills, fever, malaise/fatigue and weight loss.  HENT: Negative for nosebleeds and sore throat.   Eyes: Negative for double vision, photophobia and redness.  Respiratory: Negative for cough, shortness of breath and wheezing.   Cardiovascular: Negative for chest pain, palpitations and orthopnea.  Gastrointestinal: Negative for abdominal pain, blood in stool, nausea and vomiting.  Genitourinary:       Chronic urinary retention, indwelling Foley catheter.  Musculoskeletal: Negative for back pain, myalgias and neck pain.  Skin: Negative for itching and rash.       Chronic bilateral lower extremity wound  Neurological: Negative for dizziness, tingling and tremors.  Endo/Heme/Allergies: Negative for environmental allergies. Does not  bruise/bleed easily.  Psychiatric/Behavioral: Negative for depression.    MEDICAL HISTORY:  Past Medical History:  Diagnosis Date  . Adult failure to thrive   . Asthma   . Cellulitis of left foot   . Cellulitis of right foot   . Diabetes mellitus without complication ([place])   . Glaucoma   . HLD (hyperlipidemia)   . Hypertension   . Protein calorie malnutrition ([place])   . Stage 2 skin ulcer of sacral region ([place])   . Thyroid disease    hypothyroid  . Urinary retention     SURGICAL HISTORY: Past Surgical History:  Procedure Laterality Date  . APPENDECTOMY      SOCIAL HISTORY: Social History   Socioeconomic History  . Marital status: Single    Spouse name: Not on file  . Number of children: Not on file  . Years of education: Not on file  . Highest education level: Not on file  Occupational History  . Not on file  Social Needs  . Financial resource strain: Not on file  . Food insecurity:    Worry: Not on file    Inability: Not on file  . Transportation needs:    Medical: Not on file    Non-medical: Not on file  Tobacco Use  . Smoking status: Former Research scientist (life sciences)  . Smokeless tobacco: Never Used  Substance and Sexual Activity  . Alcohol use: No  . Drug use: No  . Sexual activity: Not on file  Lifestyle  . Physical activity:    Days per week: Not on file    Minutes per session: Not on file  . Stress: Not on file  Relationships  . Social connections:    Talks on phone: Not on file    Gets together: Not on file  Attends religious service: Not on file    Active member of club or organization: Not on file    Attends meetings of clubs or organizations: Not on file    Relationship status: Not on file  . Intimate partner violence:    Fear of current or ex partner: Not on file    Emotionally abused: Not on file    Physically abused: Not on file    Forced sexual activity: Not on file  Other Topics Concern  . Not on file  Social History Narrative  . Not on file     FAMILY HISTORY: Family History  Problem Relation Age of Onset  . Diabetes Mother   . Diabetes Father   . Alzheimer's disease Father   . Breast cancer Neg Hx     ALLERGIES:  is allergic to penicillins.  MEDICATIONS:  Current Outpatient Medications  Medication Sig Dispense Refill  . acetaminophen (TYLENOL) 325 MG tablet Take 650 mg by mouth every 6 (six) hours as needed.    . Cholecalciferol (D3-50) 50000 units capsule Take 50,000 Units by mouth once a week.    . ferrous sulfate 325 (65 FE) MG tablet Take 325 mg by mouth daily with breakfast.    . gabapentin (NEURONTIN) 100 MG capsule Take 200 mg by mouth 3 (three) times daily.    Marland Kitchen glipiZIDE (GLUCOTROL) 5 MG tablet Take by mouth daily before breakfast.    . insulin aspart (NOVOLOG) 100 UNIT/ML injection Inject into the skin 3 (three) times daily before meals.    Marland Kitchen levothyroxine (SYNTHROID, LEVOTHROID) 125 MCG tablet Take 1 tablet (125 mcg total) by mouth daily before breakfast. 30 tablet 0  . tamsulosin (FLOMAX) 0.4 MG CAPS capsule Take 1 capsule (0.4 mg total) by mouth daily. 30 capsule 0  . dronabinol (MARINOL) 2.5 MG capsule Take 1 capsule (2.5 mg total) by mouth 2 (two) times daily before lunch and supper. (Patient not taking: Reported on 07/21/2018) 30 capsule 0  . insulin NPH-regular Human (NOVOLIN 70/30) (70-30) 100 UNIT/ML injection Inject into the skin.     No current facility-administered medications for this visit.      PHYSICAL EXAMINATION: ECOG PERFORMANCE STATUS: 2 - Symptomatic, <50% confined to bed Vitals:   07/21/18 1113  BP: 131/82  Pulse: 77  Resp: 18  Temp: 97.8 F (36.6 C)   Filed Weights   07/21/18 1113  Weight: 187 lb 4.8 oz (85 kg)    Physical Exam  Constitutional: She is oriented to person, place, and time. She appears well-developed and well-nourished. No distress.  HENT:  Head: Normocephalic and atraumatic.  Mouth/Throat: Oropharynx is clear and moist.  Eyes: Pupils are equal, round, and  reactive to light. EOM are normal. No scleral icterus.  Neck: Normal range of motion. Neck supple.  Cardiovascular: Normal rate, regular rhythm and normal heart sounds.  Pulmonary/Chest: Effort normal. No respiratory distress. She has no wheezes.  Abdominal: Soft. Bowel sounds are normal. She exhibits no distension and no mass. There is no tenderness.  Musculoskeletal: Normal range of motion. She exhibits no edema or deformity.  Chronic lower extremity swelling due to venous insufficiency.  Neurological: She is alert and oriented to person, place, and time. No cranial nerve deficit. Coordination normal.  Skin: Skin is warm.  Bilateral lower extremity wound wrapped with dressing.  Psychiatric: She has a normal mood and affect. Her behavior is normal. Thought content normal.     LABORATORY DATA:  I have reviewed the data as listed Lab  Results  Component Value Date   WBC 8.0 07/21/2018   HGB 10.9 (L) 07/21/2018   HCT 33.2 (L) 07/21/2018   MCV 83.6 07/21/2018   PLT 323 07/21/2018   Recent Labs    03/31/18 0453 04/01/18 0906 07/08/18 1126 07/21/18 1136  NA 134* 136  --  138  K 5.0 4.4  --  4.1  CL 109 113*  --  107  CO2 20* 20*  --  24  GLUCOSE 161* 164*  --  68*  BUN 74* 40*  --  23  CREATININE 4.39* 1.25* 0.90 1.10*  CALCIUM 8.3* 7.9*  --  9.5  GFRNONAA 10* 44*  --  51*  GFRAA 11* 51*  --  59*  PROT  --   --   --  8.9*  ALBUMIN  --   --   --  3.1*  AST  --   --   --  25  ALT  --   --   --  25  ALKPHOS  --   --   --  107  BILITOT  --   --   --  0.4   Iron/TIBC/Ferritin/ %Sat    Component Value Date/Time   IRON 47 07/21/2018 1136   TIBC 221 (L) 07/21/2018 1136   FERRITIN 80 07/21/2018 1136   IRONPCTSAT 21 07/21/2018 1136        ASSESSMENT & PLAN:  1. Lymphadenopathy   2. Anemia, unspecified type    CT renal scan was reviewed and discussed with patient. She has incidental finding of bilateral inguinal lymph node.  On physical examination due to her body  habitus, I am not able to appreciate any inguinal lymphadenopathy. Discussed with patient that lymphadenopathy can be secondary to inflammation/infection,  lymph node proliferation, malignant hematology disorders. Clinically, inguinal lymphadenopathy could be due to her chronic lower extremity inflammation secondary to vein insufficiency. I will order.  Baseline work-up   04/03/2018, hemoglobin 8.8, MCV 85.5.  Patient has anemia, normocytic.  We will also order lab work-up for anemia.  Orders Placed This Encounter  Procedures  . CBC with Differential/Platelet    Standing Status:   Future    Number of Occurrences:   1    Standing Expiration Date:   07/22/2019  . Comprehensive metabolic panel    Standing Status:   Future    Number of Occurrences:   1    Standing Expiration Date:   07/22/2019  . Lactate dehydrogenase    Standing Status:   Future    Number of Occurrences:   1    Standing Expiration Date:   07/22/2019  . Flow cytometry panel-leukemia/lymphoma work-up    Standing Status:   Future    Number of Occurrences:   1    Standing Expiration Date:   07/22/2019  . HIV antibody    Standing Status:   Future    Number of Occurrences:   1    Standing Expiration Date:   07/22/2019  . Hepatitis panel, acute    Standing Status:   Future    Number of Occurrences:   1    Standing Expiration Date:   07/22/2019  . Iron and TIBC    Standing Status:   Future    Number of Occurrences:   1    Standing Expiration Date:   07/22/2019  . Ferritin    Standing Status:   Future    Number of Occurrences:   1    Standing Expiration Date:  07/22/2019  . Folate    Standing Status:   Future    Number of Occurrences:   1    Standing Expiration Date:   07/22/2019  . Vitamin B12    Standing Status:   Future    Number of Occurrences:   1    Standing Expiration Date:   07/22/2019  . Technologist smear review    Standing Status:   Future    Number of Occurrences:   1    Standing Expiration Date:   07/22/2019     All questions were answered. The patient knows to call the clinic with any problems questions or concerns.  Return of visit: 10 days to discuss results. Thank you for this kind referral and the opportunity to participate in the care of this patient. A copy of today's note is routed to referring provider  Total face to face encounter time for this patient visit was 54min. >50% of the time was  spent in counseling and coordination of care.    Earlie Server, MD, PhD Hematology Oncology [facility] Pager- [phone] 07/21/2018

## 2018-07-21 NOTE — Progress Notes (Signed)
Patient here for initial evaluaiton. She lives at the [facility]. Foley in place draining clear yellow urine.

## 2018-07-22 LAB — HEPATITIS PANEL, ACUTE
HCV AB: 0.1 {s_co_ratio} (ref 0.0–0.9)
HEP A IGM: NEGATIVE
HEP B C IGM: NEGATIVE
HEP B S AG: NEGATIVE

## 2018-07-22 LAB — VITAMIN B12: Vitamin B-12: 255 pg/mL (ref 180–914)

## 2018-07-22 LAB — HIV ANTIBODY (ROUTINE TESTING W REFLEX): HIV Screen 4th Generation wRfx: NONREACTIVE

## 2018-07-23 ENCOUNTER — Other Ambulatory Visit: Payer: Self-pay

## 2018-07-23 ENCOUNTER — Encounter: Payer: Self-pay | Admitting: *Deleted

## 2018-07-23 ENCOUNTER — Emergency Department
Admission: EM | Admit: 2018-07-23 | Discharge: 2018-07-23 | Disposition: A | Discharge status: Home / Self Care | Payer: Medicare Other | Attending: Emergency Medicine | Admitting: Emergency Medicine

## 2018-07-23 DIAGNOSIS — T839XXA Unspecified complication of genitourinary prosthetic device, implant and graft, initial encounter: Secondary | ICD-10-CM | POA: Insufficient documentation

## 2018-07-23 DIAGNOSIS — N3000 Acute cystitis without hematuria: Secondary | ICD-10-CM

## 2018-07-23 DIAGNOSIS — Y731 Therapeutic (nonsurgical) and rehabilitative gastroenterology and urology devices associated with adverse incidents: Secondary | ICD-10-CM | POA: Insufficient documentation

## 2018-07-23 LAB — COMP PANEL: LEUKEMIA/LYMPHOMA

## 2018-07-23 LAB — URINALYSIS, COMPLETE (UACMP) WITH MICROSCOPIC
Bilirubin Urine: NEGATIVE
Glucose, UA: NEGATIVE mg/dL
Ketones, ur: NEGATIVE mg/dL
Nitrite: POSITIVE — AB
PH: 8 (ref 5.0–8.0)
Protein, ur: NEGATIVE mg/dL
RBC / HPF: >50 RBC/hpf — ABNORMAL HIGH (ref 0–5)
SPECIFIC GRAVITY, URINE: 1.009 (ref 1.005–1.030)
SQUAMOUS EPITHELIAL / LPF: NONE SEEN (ref 0–5)

## 2018-07-23 MED ORDER — CEFTRIAXONE SODIUM 1 G IJ SOLR
1.0000 g | Freq: Once | INTRAMUSCULAR | Status: AC
  Administered 2018-07-23: 1 g via INTRAMUSCULAR
  Filled 2018-07-23: qty 10

## 2018-07-23 MED ORDER — CIPROFLOXACIN HCL 500 MG PO TABS: 500.0000 mg | ORAL_TABLET | Freq: Two times a day (BID) | ORAL | 0 refills | Status: AC

## 2018-07-23 NOTE — ED Provider Notes (Signed)
[facility] Emergency Department Provider Note       Time seen: ----------------------------------------- 5:37 PM on 07/23/2018 -----------------------------------------   I have reviewed the triage vital signs and the nursing notes.  HISTORY   Chief Complaint Urinary cath evaluation    HPI [name] is a 67 y.o. female with a history of asthma, diabetes, hyperlipidemia, hypertension, thyroid disease who presents to the ED for Foley catheter dysfunction.  Patient states she is having irritation around the urinary catheter site.  The catheter was placed 1 month ago in the ER reports having been seen by urologist last week but was not told whether or not she could have it removed.  She has another follow-up next Tuesday.  Large amount of sediment was noted to be in the urinary catheter.  She denies any other complaints.  Past Medical History:  Diagnosis Date  . Adult failure to thrive   . Asthma   . Cellulitis of left foot   . Cellulitis of right foot   . Diabetes mellitus without complication ([place])   . Glaucoma   . HLD (hyperlipidemia)   . Hypertension   . Protein calorie malnutrition ([place])   . Stage 2 skin ulcer of sacral region ([place])   . Thyroid disease    hypothyroid  . Urinary retention     Patient Active Problem List   Diagnosis Date Noted  . Pressure injury of skin 03/27/2018  . Sepsis ([place]) 03/26/2018  . Diabetic infection of left foot ([place]) 03/26/2018  . AKI (acute kidney injury) ([place]) 03/26/2018  . Venous ulcer of left leg ([place]) 11/18/2016  . Chronic venous insufficiency 11/18/2016  . Lymphedema 11/18/2016  . Swelling of limb 11/18/2016  . Type 2 diabetes mellitus with insulin therapy ([place]) 11/18/2016  . Hyperlipidemia 11/18/2016    Past Surgical History:  Procedure Laterality Date  . APPENDECTOMY      Allergies Penicillins  Social History Social History   Tobacco Use  . Smoking status: Former Research scientist (life sciences)  . Smokeless  tobacco: Never Used  Substance Use Topics  . Alcohol use: No  . Drug use: No   Review of Systems Constitutional: Negative for fever. Cardiovascular: Negative for chest pain. Respiratory: Negative for shortness of breath. Gastrointestinal: Negative for abdominal pain, vomiting and diarrhea. Genitourinary: Positive for pain around the Foley catheter Skin: Negative for rash. Neurological: Negative for headaches, focal weakness or numbness.  All systems negative/normal/unremarkable except as stated in the HPI  ____________________________________________   PHYSICAL EXAM:  VITAL SIGNS: ED Triage Vitals  Enc Vitals Group     BP --      Pulse --      Resp --      Temp --      Temp src --      SpO2 --      Weight 07/23/18 1735 187 lb (84.8 kg)     Height 07/23/18 1735 5\' 4"  (1.626 m)     Head Circumference --      Peak Flow --      Pain Score 07/23/18 1734 8     Pain Loc --      Pain Edu? --      Excl. in [place]? --    Constitutional: Alert and oriented. Well appearing and in no distress. Eyes: Conjunctivae are normal. Normal extraocular movements. ENT   Head: Normocephalic and atraumatic.   Nose: No congestion/rhinnorhea.   Mouth/Throat: Mucous membranes are moist.   Neck: No stridor. Cardiovascular: Rapid rate,  regular rhythm. No murmurs, rubs, or gallops. Respiratory: Normal respiratory effort without tachypnea nor retractions. Breath sounds are clear and equal bilaterally. No wheezes/rales/rhonchi. Gastrointestinal: Soft and nontender. Normal bowel sounds Genitourinary: Foley catheter appears to be in place, large amount of sedimentation noted in the catheter and bag Musculoskeletal: Nontender with normal range of motion in extremities. No lower extremity tenderness nor edema. Neurologic:  Normal speech and language. No gross focal neurologic deficits are appreciated.  Psychiatric: Mood and affect are normal. Speech and behavior are normal.   ____________________________________________  ED COURSE:  As part of my medical decision making, I reviewed the following data within the [place] History obtained from family if available, nursing notes, old chart and ekg, as well as notes from prior ED visits. Patient presented for Foley catheter dysfunction, we will assess with labs and imaging as indicated at this time.   Procedures ____________________________________________   LABS (pertinent positives/negatives)  Labs Reviewed  URINALYSIS, COMPLETE (UACMP) WITH MICROSCOPIC - Abnormal; Notable for the following components:      Result Value   Color, Urine YELLOW (*)    APPearance HAZY (*)    Hgb urine dipstick MODERATE (*)    Nitrite POSITIVE (*)    Leukocytes, UA LARGE (*)    RBC / HPF >50 (*)    Bacteria, UA RARE (*)    All other components within normal limits  URINE CULTURE  ____________________________________________  DIFFERENTIAL DIAGNOSIS   UTI, urethritis, urinary retention  FINAL ASSESSMENT AND PLAN  Foley catheter replacement   Plan: The patient had presented for irritation from the Foley catheter. Patient's labs did indicate significant urinary tract infection after the new Foley catheter was placed.  She was tachycardic here but is asymptomatic and she has no complaints at this time.  We did give her a gram of Rocephin and she will be discharged with Cipro.  She is cleared for outpatient follow-up in 2 days for recheck.   Laurence Aly, MD   Note: This note was generated in part or whole with voice recognition software. Voice recognition is usually quite accurate but there are transcription errors that can and very often do occur. I apologize for any typographical errors that were not detected and corrected.     Earleen Newport, MD 07/23/18 2028

## 2018-07-23 NOTE — ED Notes (Signed)
At this time , I have called the [facility] X2 to give report of pt . Phone has been buzzy both times

## 2018-07-23 NOTE — ED Triage Notes (Signed)
Pt to ED from [facility] reporting pain around her urinary catheter site. Pt had cath placed 1 month ago in ED and reports having since been to the urologist last week but was not told if she could have the catheter removed. Pt has another follow up with the urologist next Tuesday but reports that she wants the catheter removed today.   Large amount of sediment noted in catheter.

## 2018-07-26 NOTE — Progress Notes (Signed)
07/28/2018 10:21 AM   [name] [birth year] [id number]  Referring provider: No referring provider defined for this encounter.  Chief Complaint  Patient presents with  . Hydronephrosis    result renal lasix scan -would like cath removed     HPI: Patient is a 67 year old African American female with uncontrolled DM, lymphadenitits, cellulitis and bilateral hydronephrosis who presents today to discuss her renal lasix scan.    Background history  Patient is a 67 year old female with a history of uncontrolled diabetes, lymphadenitis and was recently admitted for cellulitis and was found to have bilateral hydronephrosis and urinary retention and Foley catheter was placed with her transportation specialist, [place].   Her Foley was placed on March 30, 2018 after a renal ultrasound was performed due to increasing serum creatinine levels.  PVR was > 1500cc.  RUS on 03/30/2018 noted mild right-sided and moderate left-sided hydronephrosis.  She was initiated on tamsulosin 0.4 mg daily. Foley catheter was exchanged on 06/23/2018.  She denies any difficulty with urination prior to her hospital admission.  Patient denies any gross hematuria, dysuria or suprapubic/flank pain.  Patient denies any fevers, chills, nausea or vomiting.     RUS on 06/23/2018 noted interval resolution of right-sided hydronephrosis. Persistent moderate left-sided hydronephrosis.  Partial distention of the urinary bladder with a Foley catheter present.    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  There is a moderate to marked left-sided hydronephrosis to the level of the UPJ. No stone or mass noted. 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  Right-sided pelvocaliectasis without evidence for right-sided obstructive uropathy.  Aortic Atherosclerosis.  Bilateral inguinal adenopathy. Etiology indeterminate.  Differential considerations include reactive adenopathy versus metastatic disease or lymphoproliferative  disorder.  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 The left renal moiety is larger than the right.  The right renal moiety demonstrates no evidence of obstruction.  The left renal moiety demonstrates a dilated renal collecting system which partially washes out after Lasix. 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  Today, she is wanting the catheter out.  She was seen recently in the ED for catheter issues.  She was diagnosed with an UTI and placed on an antibiotic.  Patient denies any gross hematuria, dysuria or suprapubic/flank pain.  Patient denies any fevers, chills, nausea or vomiting.   PMH: Past Medical History:  Diagnosis Date  . Adult failure to thrive   . Asthma   . Cellulitis of left foot   . Cellulitis of right foot   . Diabetes mellitus without complication ([place])   . Glaucoma   . HLD (hyperlipidemia)   . Hypertension   . Protein calorie malnutrition ([place])   . Stage 2 skin ulcer of sacral region ([place])   . Thyroid disease    hypothyroid  . Urinary retention     Surgical History: Past Surgical History:  Procedure Laterality Date  . APPENDECTOMY      Home Medications:  Allergies as of 07/28/2018      Reactions   Penicillins Swelling   Has patient had a PCN reaction causing immediate rash, facial/tongue/throat swelling, SOB or lightheadedness with hypotension: Unknown Has patient had a PCN reaction causing severe rash involving mucus membranes or skin necrosis: Unknown Has patient had a PCN reaction that required hospitalization: Unknown Has patient had a PCN reaction occurring within the last 10 years: Unknown If all of the above answers are "NO", then may  proceed with Cephalosporin use.      Medication List        Accurate as of 07/28/18 10:21 AM. Always use your most recent med list.          acetaminophen 325 MG tablet Commonly known as:  TYLENOL Take 650 mg by mouth every 6 (six) hours as needed.     ciprofloxacin 500 MG tablet Commonly known as:  CIPRO Take 1 tablet (500 mg total) by mouth 2 (two) times daily for 10 days.   D3-50 50000 units capsule Generic drug:  Cholecalciferol Take 50,000 Units by mouth once a week.   ferrous sulfate 325 (65 FE) MG tablet Take 325 mg by mouth daily with breakfast.   gabapentin 100 MG capsule Commonly known as:  NEURONTIN Take 200 mg by mouth 3 (three) times daily.   glipiZIDE 5 MG tablet Commonly known as:  GLUCOTROL Take by mouth daily before breakfast.   insulin aspart 100 UNIT/ML injection Commonly known as:  novoLOG Inject into the skin 3 (three) times daily before meals.   levothyroxine 125 MCG tablet Commonly known as:  SYNTHROID, LEVOTHROID Take 1 tablet (125 mcg total) by mouth daily before breakfast.   NOVOLIN 70/30 (70-30) 100 UNIT/ML injection Generic drug:  insulin NPH-regular Human Inject into the skin.   tamsulosin 0.4 MG Caps capsule Commonly known as:  FLOMAX Take 1 capsule (0.4 mg total) by mouth daily.       Allergies:  Allergies  Allergen Reactions  . Penicillins Swelling    Has patient had a PCN reaction causing immediate rash, facial/tongue/throat swelling, SOB or lightheadedness with hypotension: Unknown Has patient had a PCN reaction causing severe rash involving mucus membranes or skin necrosis: Unknown Has patient had a PCN reaction that required hospitalization: Unknown Has patient had a PCN reaction occurring within the last 10 years: Unknown If all of the above answers are "NO", then may proceed with Cephalosporin use.     Family History: Family History  Problem Relation Age of Onset  . Diabetes Mother   . Diabetes Father   . Alzheimer's disease Father   . Breast cancer Neg Hx     Social History:  reports that she has quit smoking. She has never used smokeless tobacco. She reports that she does not drink alcohol or use drugs.  ROS: UROLOGY Frequent Urination?: No Hard to postpone  urination?: No Burning/pain with urination?: No Get up at night to urinate?: No Leakage of urine?: No Urine stream starts and stops?: No Trouble starting stream?: No Do you have to strain to urinate?: No Blood in urine?: No Urinary tract infection?: No Sexually transmitted disease?: No Injury to kidneys or bladder?: No Painful intercourse?: No Weak stream?: No Currently pregnant?: No Vaginal bleeding?: No Last menstrual period?: N  Gastrointestinal Nausea?: No Vomiting?: No Indigestion/heartburn?: No Diarrhea?: No Constipation?: No  Constitutional Fever: No Night sweats?: No Weight loss?: No Fatigue?: No  Skin Skin rash/lesions?: No Itching?: No  Eyes Blurred vision?: No Double vision?: No  Ears/Nose/Throat Sore throat?: No Sinus problems?: No  Hematologic/Lymphatic Swollen glands?: No Easy bruising?: No  Cardiovascular Leg swelling?: No  Respiratory Cough?: No Shortness of breath?: No  Endocrine Excessive thirst?: No  Musculoskeletal Back pain?: No Joint pain?: No  Neurological Headaches?: No Dizziness?: No  Psychologic Depression?: No Anxiety?: No  Physical Exam: BP 123/74   Pulse 90   Resp 17   Ht 5\' 4"  (1.626 m)   Wt 184 lb (83.5 kg)  LMP  (LMP Unknown)   BMI 31.58 kg/m   Constitutional: Well nourished. Alert and oriented, No acute distress. HEENT: [place] AT, moist mucus membranes. Trachea midline, no masses. Cardiovascular: No clubbing, cyanosis, or edema. Respiratory: Normal respiratory effort, no increased work of breathing. GI: Abdomen is soft, non tender, non distended, no abdominal masses. Liver and spleen not palpable.  No hernias appreciated.  Stool sample for occult testing is not indicated.   GU: No CVA tenderness.  No bladder fullness or masses.   Skin: No rashes, bruises or suspicious lesions. Lymph: No cervical or inguinal adenopathy.  Both legs wrapped with dressing.  Neurologic: Grossly intact, no focal deficits,  moving all 4 extremities. Psychiatric: Normal mood and affect.   Laboratory Data: Lab Results  Component Value Date   WBC 8.0 07/21/2018   HGB 10.9 (L) 07/21/2018   HCT 33.2 (L) 07/21/2018   MCV 83.6 07/21/2018   PLT 323 07/21/2018    Lab Results  Component Value Date   CREATININE 1.10 (H) 07/21/2018    No results found for: PSA  No results found for: TESTOSTERONE  No results found for: HGBA1C  No results found for: TSH  No results found for: CHOL, HDL, CHOLHDL, VLDL, LDLCALC  Lab Results  Component Value Date   AST 25 07/21/2018   Lab Results  Component Value Date   ALT 25 07/21/2018   No components found for: ALKALINEPHOPHATASE No components found for: BILIRUBINTOTAL  No results found for: ESTRADIOL  Urinalysis    Component Value Date/Time   COLORURINE YELLOW (A) 07/23/2018 1851   APPEARANCEUR HAZY (A) 07/23/2018 1851   LABSPEC 1.009 07/23/2018 1851   PHURINE 8.0 07/23/2018 1851   GLUCOSEU NEGATIVE 07/23/2018 1851   HGBUR MODERATE (A) 07/23/2018 1851   BILIRUBINUR NEGATIVE 07/23/2018 1851   KETONESUR NEGATIVE 07/23/2018 1851   PROTEINUR NEGATIVE 07/23/2018 1851   NITRITE POSITIVE (A) 07/23/2018 1851   LEUKOCYTESUR LARGE (A) 07/23/2018 1851    I have reviewed the labs.   Pertinent Imaging: CLINICAL DATA:  Known horseshoe kidney. Evaluate for left UPJ obstruction.  EXAM: NUCLEAR MEDICINE RENAL SCAN WITH DIURETIC ADMINISTRATION  TECHNIQUE: Radionuclide angiographic and sequential renal images were obtained after intravenous injection of radiopharmaceutical. Imaging was continued during slow intravenous injection of Lasix approximately 15 minutes after the start of the examination.  RADIOPHARMACEUTICALS:  5.38 mCi Technetium-83m MAG3 IV  COMPARISON:  CT scan July 08, 2018  FINDINGS: Flow:  Prompt symmetric arterial flow to the kidneys.  Left renogram: The patient has a horseshoe kidney. The left renal moiety is larger than the  right. There is normal time to maximum cortical uptake. There is normal time to excretion. Activity collects in a dilated left renal collecting system throughout the study with partial washout after Lasix.  Right renogram: The patient has a horseshoe kidney. The right renal moiety is smaller than the left. There is normal time to maximum cortical uptake as well as normal time to excretion. There is complete washout of the right kidney beginning before Lasix and continuing after Lasix.  Differential:  Left kidney = 54.3 %  Right kidney = 45.7 %  T1/2 post Lasix :  Left kidney = 22.6 min  Right kidney = 5.2 min  IMPRESSION: 1. The patient has a horseshoe kidney. The left renal moiety is larger than the right. 2. The right renal moiety demonstrates no evidence of obstruction. 3. The left renal moiety demonstrates a dilated renal collecting system which partially washes out after Lasix.  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   Electronically Signed   By: Dorise Bullion III M.D   On: 07/27/2018 13:01  I have independently reviewed the films.  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 Erlene Quan   2. Urinary retention Foley catheter removed for TOV Patient advised to RTC around 3 pm if unable to void RTC in one week for PVR and recheck  Continue tamsulosin 0.4 mg daily   3. Inguinal adenopathy Followed by Dr. Tasia Catchings at the cancer center  Return in about 1 week (around 08/04/2018) for PVR and office visit .  These notes generated with voice recognition software. I apologize for typographical errors.  Zara Council, PA-C  [facility] [address]  [place], [place]  [id number] [phone]

## 2018-07-27 ENCOUNTER — Encounter
Admission: RE | Admit: 2018-07-27 | Discharge: 2018-07-27 | Disposition: A | Discharge status: Home / Self Care | Payer: Medicare Other | Source: Ambulatory Visit | Attending: Urology | Admitting: Urology

## 2018-07-27 DIAGNOSIS — Q6211 Congenital occlusion of ureteropelvic junction: Secondary | ICD-10-CM | POA: Diagnosis present

## 2018-07-27 MED ORDER — TECHNETIUM TC 99M MERTIATIDE
5.3800 | Freq: Once | INTRAVENOUS | Status: AC | PRN
  Administered 2018-07-27: 5.38 via INTRAVENOUS

## 2018-07-27 MED ORDER — FUROSEMIDE 10 MG/ML IJ SOLN
42.5000 mg | Freq: Once | INTRAMUSCULAR | Status: DC
  Filled 2018-07-27: qty 4.3

## 2018-07-28 ENCOUNTER — Ambulatory Visit (INDEPENDENT_AMBULATORY_CARE_PROVIDER_SITE_OTHER): Payer: Medicare Other | Admitting: Urology

## 2018-07-28 ENCOUNTER — Encounter: Payer: Self-pay | Admitting: Urology

## 2018-07-28 ENCOUNTER — Encounter: Payer: Medicare Other | Attending: Nurse Practitioner | Admitting: Nurse Practitioner

## 2018-07-28 VITALS — BP 123/74 | HR 90 | Resp 17 | Ht 64.0 in | Wt 184.0 lb

## 2018-07-28 DIAGNOSIS — I10 Essential (primary) hypertension: Secondary | ICD-10-CM | POA: Insufficient documentation

## 2018-07-28 DIAGNOSIS — E1142 Type 2 diabetes mellitus with diabetic polyneuropathy: Secondary | ICD-10-CM | POA: Diagnosis not present

## 2018-07-28 DIAGNOSIS — I890 Lymphedema, not elsewhere classified: Secondary | ICD-10-CM | POA: Diagnosis not present

## 2018-07-28 DIAGNOSIS — N1330 Unspecified hydronephrosis: Secondary | ICD-10-CM | POA: Diagnosis not present

## 2018-07-28 DIAGNOSIS — R590 Localized enlarged lymph nodes: Secondary | ICD-10-CM | POA: Diagnosis not present

## 2018-07-28 DIAGNOSIS — R339 Retention of urine, unspecified: Secondary | ICD-10-CM | POA: Diagnosis not present

## 2018-07-28 DIAGNOSIS — Q6211 Congenital occlusion of ureteropelvic junction: Secondary | ICD-10-CM | POA: Diagnosis not present

## 2018-07-28 DIAGNOSIS — R3129 Other microscopic hematuria: Secondary | ICD-10-CM

## 2018-07-28 LAB — URINE CULTURE
Culture: >=100000 — AB
SPECIAL REQUESTS: NORMAL

## 2018-07-30 DIAGNOSIS — I890 Lymphedema, not elsewhere classified: Secondary | ICD-10-CM | POA: Diagnosis not present

## 2018-07-30 NOTE — Progress Notes (Signed)
Pharmacy Antibiotic Note  [name] is a 67 y.o. female seen in the ED on 07/23/2018 due to sediment being found in her urinary catheter. She was discharged back to the [facility] following a single dose of ceftriaxone and a prescription for ciprofloxacin 500mg  BID for 10 days. Her urine has grown P mirabilis and pan-S enterococcus. Sensitivities reveal that bacteria are sensitive to one or the other antibiotics. A single dose of ceftriaxone should be sufficient for treatment ([place] Oct;14(10):970-2.)  Plan: No further treatment required  Height: 5\' 4"  (162.6 cm) Weight: 187 lb (84.8 kg) IBW/kg (Calculated) : 54.7  No data recorded.  No results for input(s): WBC, CREATININE, LATICACIDVEN, VANCOTROUGH, VANCOPEAK, VANCORANDOM, GENTTROUGH, GENTPEAK, GENTRANDOM, TOBRATROUGH, TOBRAPEAK, TOBRARND, AMIKACINPEAK, AMIKACINTROU, AMIKACIN in the last 168 hours.  Estimated Creatinine Clearance: 53 mL/min (A) (by C-G formula based on SCr of 1.1 mg/dL (H)).    Allergies  Allergen Reactions  . Penicillins Swelling    Has patient had a PCN reaction causing immediate rash, facial/tongue/throat swelling, SOB or lightheadedness with hypotension: Unknown Has patient had a PCN reaction causing severe rash involving mucus membranes or skin necrosis: Unknown Has patient had a PCN reaction that required hospitalization: Unknown Has patient had a PCN reaction occurring within the last 10 years: Unknown If all of the above answers are "NO", then may proceed with Cephalosporin use.     Thank you for allowing pharmacy to be a part of this patient's care.  Dallie Piles, PharmD 07/30/2018 12:45 PM

## 2018-07-31 ENCOUNTER — Ambulatory Visit: Payer: Medicare Other | Admitting: Oncology

## 2018-08-02 NOTE — Progress Notes (Signed)
TERIANN, LIVINGOOD ([id number]) Visit Report for 07/21/2018 Arrival Information Details Patient Name: [name], [name] Date of Service: 07/21/2018 9:30 AM Medical Record Number: [id number] Patient Account Number: [id number] Date of Birth/Sex: Jul 08, 1951 (67 y.o. F) Treating RN: Montey Hora Primary Care Santo Zahradnik: PATIENT, NO Other Clinician: Referring Oletha Tolson: Irene Limbo Treating Raevon Broom/Extender: Melburn Hake, HOYT Weeks in Treatment: 4 Visit Information History Since Last Visit Added or deleted any medications: No Patient Arrived: Walker Any new allergies or adverse reactions: No Arrival Time: 09:33 Had a fall or experienced change in No Accompanied By: self activities of daily living that may affect Transfer Assistance: None risk of falls: Patient Identification Verified: Yes Signs or symptoms of abuse/neglect since last visito No Secondary Verification Process Completed: Yes Hospitalized since last visit: No Patient Has Alerts: Yes Implantable device outside of the clinic excluding No Patient Alerts: Type II Diabetic cellular tissue based products placed in the center since last visit: Has Dressing in Place as Prescribed: Yes Has Compression in Place as Prescribed: Yes Pain Present Now: No Electronic Signature(s) Signed: 07/21/2018 5:54:46 PM By: Montey Hora Entered By: Montey Hora on 07/21/2018 09:33:25 [name] ([id number]) -------------------------------------------------------------------------------- Encounter Discharge Information Details Patient Name: [name] Date of Service: 07/21/2018 9:30 AM Medical Record Number: [id number] Patient Account Number: [id number] Date of Birth/Sex: [birth year] (67 y.o. F) Treating RN: Roger Shelter Primary Care Merdith Adan: PATIENT, NO Other Clinician: Referring Shaterria Sager: Irene Limbo Treating Marlyne Totaro/Extender: Melburn Hake, HOYT Weeks in Treatment: 4 Encounter Discharge Information Items Discharge  Condition: Stable Ambulatory Status: Walker Discharge Destination: Home Transportation: Private Auto Schedule Follow-up Appointment: Yes Clinical Summary of Care: Electronic Signature(s) Signed: 07/22/2018 5:09:20 PM By: Roger Shelter Entered By: Roger Shelter on 07/21/2018 10:41:42 [name] ([id number]) -------------------------------------------------------------------------------- Lower Extremity Assessment Details Patient Name: [name] Date of Service: 07/21/2018 9:30 AM Medical Record Number: [id number] Patient Account Number: [id number] Date of Birth/Sex: Nov 14, 1951 (67 y.o. F) Treating RN: Montey Hora Primary Care Kaysa Roulhac: PATIENT, NO Other Clinician: Referring Pao Haffey: Irene Limbo Treating Keefer Soulliere/Extender: STONE III, HOYT Weeks in Treatment: 4 Edema Assessment Assessed: [Left: No] [Right: No] [Left: Edema] [Right: :] Calf Left: Right: Point of Measurement: 32 cm From Medial Instep 51.1 cm 41.5 cm Ankle Left: Right: Point of Measurement: 12 cm From Medial Instep 30.2 cm 25.6 cm Vascular Assessment Pulses: Dorsalis Pedis Palpable: [Left:Yes] [Right:Yes] Posterior Tibial Extremity colors, hair growth, and conditions: Extremity Color: [Left:Hyperpigmented] [Right:Hyperpigmented] Hair Growth on Extremity: [Left:No] [Right:No] Temperature of Extremity: [Left:Warm] [Right:Warm] Capillary Refill: [Left:< 3 seconds] [Right:< 3 seconds] Toe Nail Assessment Left: Right: Thick: Yes Yes Discolored: Yes Yes Deformed: Yes Yes Improper Length and Hygiene: Yes Yes Electronic Signature(s) Signed: 07/21/2018 5:54:46 PM By: Montey Hora Entered By: Montey Hora on 07/21/2018 09:42:16 Cupples, Garvin Fila ([id number]) -------------------------------------------------------------------------------- Multi Wound Chart Details Patient Name: [name] Date of Service: 07/21/2018 9:30 AM Medical Record Number: [id number] Patient Account Number:  [id number] Date of Birth/Sex: [birth year] (67 y.o. F) Treating RN: Cornell Barman Primary Care Dmarius Reeder: PATIENT, NO Other Clinician: Referring Mathias Bogacki: Irene Limbo Treating Aniya Jolicoeur/Extender: STONE III, HOYT Weeks in Treatment: 4 Vital Signs Height(in): 64 Pulse(bpm): 49 Weight(lbs): Blood Pressure(mmHg): 128/50 Body Mass Index(BMI): Temperature(F): 98.0 Respiratory Rate 16 (breaths/min): Photos: [1:No Photos] [N/A:N/A] Wound Location: [1:Left Lower Leg - Medial] [N/A:N/A] Wounding Event: [1:Gradually Appeared] [N/A:N/A] Primary Etiology: [1:Lymphedema] [N/A:N/A] Comorbid History: [1:Cataracts, Lymphedema, Asthma, Hypertension, Peripheral Venous Disease, Type II Diabetes, Neuropathy] [N/A:N/A] Date Acquired: [1:07/07/2018] [N/A:N/A] Weeks of Treatment: [1:2] [N/A:N/A] Wound Status: [1:Open] [N/A:N/A] Measurements L x  W x D [1:0.4x0.4x0.1] [N/A:N/A] (cm) Area (cm) : [1:0.126] [N/A:N/A] Volume (cm) : [1:0.013] [N/A:N/A] % Reduction in Area: [1:98.20%] [N/A:N/A] % Reduction in Volume: [1:98.20%] [N/A:N/A] Classification: [1:Partial Thickness] [N/A:N/A] Exudate Amount: [1:Small] [N/A:N/A] Exudate Type: [1:Serous] [N/A:N/A] Exudate Color: [1:amber] [N/A:N/A] Wound Margin: [1:Flat and Intact] [N/A:N/A] Granulation Amount: [1:Large (67-100%)] [N/A:N/A] Granulation Quality: [1:Pink] [N/A:N/A] Necrotic Amount: [1:None Present (0%)] [N/A:N/A] Exposed Structures: [1:Fascia: No Fat Layer (Subcutaneous Tissue) Exposed: No Tendon: No Muscle: No Joint: No Bone: No] [N/A:N/A] Epithelialization: [1:Medium (34-66%)] [N/A:N/A] Periwound Skin Texture: [1:Excoriation: No Induration: No Callus: No Crepitus: No] [N/A:N/A] Rash: No Scarring: No Periwound Skin Moisture: Maceration: No N/A N/A Dry/Scaly: No Periwound Skin Color: Atrophie Blanche: No N/A N/A Cyanosis: No Ecchymosis: No Erythema: No Hemosiderin Staining: No Mottled: No Pallor: No Rubor: No Tenderness on Palpation:  No N/A N/A Wound Preparation: Ulcer Cleansing: N/A N/A Rinsed/Irrigated with Saline, Other: soap and water Topical Anesthetic Applied: None Treatment Notes Electronic Signature(s) Signed: 07/24/2018 6:17:48 PM By: Gretta Cool, BSN, RN, CWS, Kim RN, BSN Entered By: Gretta Cool, BSN, RN, CWS, Kim on 07/21/2018 10:02:19 [name] ([id number]) -------------------------------------------------------------------------------- Multi-Disciplinary Care Plan Details Patient Name: [name] Date of Service: 07/21/2018 9:30 AM Medical Record Number: [id number] Patient Account Number: [id number] Date of Birth/Sex: 03-20-1951 (67 y.o. F) Treating RN: Cornell Barman Primary Care Captain Blucher: PATIENT, NO Other Clinician: Referring Goddess Gebbia: Irene Limbo Treating Zyshawn Bohnenkamp/Extender: STONE III, HOYT Weeks in Treatment: 4 Active Inactive ` Orientation to the Wound Care Program Nursing Diagnoses: Knowledge deficit related to the wound healing center program Goals: Patient/caregiver will verbalize understanding of the [place] Program Date Initiated: 06/22/2018 Target Resolution Date: 07/20/2018 Goal Status: Active Interventions: Provide education on orientation to the wound center Notes: ` Venous Leg Ulcer Nursing Diagnoses: Knowledge deficit related to disease process and management Goals: Patient will maintain optimal edema control Date Initiated: 07/21/2018 Target Resolution Date: 07/31/2018 Goal Status: Active Interventions: Assess peripheral edema status every visit. Treatment Activities: Therapeutic compression applied : 07/21/2018 Notes: ` Wound/Skin Impairment Nursing Diagnoses: Impaired tissue integrity Goals: Patient/caregiver will verbalize understanding of skin care regimen Date Initiated: 06/22/2018 Target Resolution Date: 07/20/2018 AINHOA, RALLO ([id number]) Goal Status: Active Ulcer/skin breakdown will have a volume reduction of 30% by week 4 Date Initiated:  06/22/2018 Target Resolution Date: 07/20/2018 Goal Status: Active Interventions: Assess patient/caregiver ability to obtain necessary supplies Assess patient/caregiver ability to perform ulcer/skin care regimen upon admission and as needed Assess ulceration(s) every visit Treatment Activities: Skin care regimen initiated : 06/22/2018 Notes: Electronic Signature(s) Signed: 07/24/2018 6:17:48 PM By: Gretta Cool, BSN, RN, CWS, Kim RN, BSN Entered By: Gretta Cool, BSN, RN, CWS, Kim on 07/21/2018 10:01:51 [name] ([id number]) -------------------------------------------------------------------------------- Pain Assessment Details Patient Name: [name] Date of Service: 07/21/2018 9:30 AM Medical Record Number: [id number] Patient Account Number: [id number] Date of Birth/Sex: January 31, 1951 (67 y.o. F) Treating RN: Montey Hora Primary Care Sara Keys: PATIENT, NO Other Clinician: Referring Clark Cuff: Irene Limbo Treating Fleurette Woolbright/Extender: STONE III, HOYT Weeks in Treatment: 4 Active Problems Location of Pain Severity and Description of Pain Patient Has Paino No Site Locations Pain Management and Medication Current Pain Management: Electronic Signature(s) Signed: 07/21/2018 5:54:46 PM By: Montey Hora Entered By: Montey Hora on 07/21/2018 09:34:48 [name] ([id number]) -------------------------------------------------------------------------------- Patient/Caregiver Education Details Patient Name: [name] Date of Service: 07/21/2018 9:30 AM Medical Record Number: [id number] Patient Account Number: [id number] Date of Birth/Gender: October 01, 1951 (67 y.o. F) Treating RN: Roger Shelter Primary Care Physician: PATIENT, NO Other Clinician: Referring Physician: Irene Limbo  Treating Physician/Extender: Worthy Keeler Weeks in Treatment: 4 Education Assessment Education Provided To: Patient and Caregiver Education Topics Provided Wound/Skin Impairment: Handouts:  Caring for Your Ulcer Methods: Explain/Verbal Responses: State content correctly Electronic Signature(s) Signed: 07/22/2018 5:09:20 PM By: Roger Shelter Entered By: Roger Shelter on 07/21/2018 10:42:17 [name] ([id number]) -------------------------------------------------------------------------------- Wound Assessment Details Patient Name: [name] Date of Service: 07/21/2018 9:30 AM Medical Record Number: [id number] Patient Account Number: [id number] Date of Birth/Sex: 11-Apr-1951 (67 y.o. F) Treating RN: Montey Hora Primary Care Labrenda Lasky: PATIENT, NO Other Clinician: Referring Kenshawn Maciolek: Irene Limbo Treating Abigaelle Verley/Extender: STONE III, HOYT Weeks in Treatment: 4 Wound Status Wound Number: 1 Primary Lymphedema Etiology: Wound Location: Left Lower Leg - Medial Wound Open Wounding Event: Gradually Appeared Status: Date Acquired: 07/07/2018 Comorbid Cataracts, Lymphedema, Asthma, Weeks Of Treatment: 2 History: Hypertension, Peripheral Venous Disease, Type Clustered Wound: No II Diabetes, Neuropathy Photos Photo Uploaded By: Montey Hora on 07/21/2018 11:25:53 Wound Measurements Length: (cm) 0.4 Width: (cm) 0.4 Depth: (cm) 0.1 Area: (cm) 0.126 Volume: (cm) 0.013 % Reduction in Area: 98.2% % Reduction in Volume: 98.2% Epithelialization: Medium (34-66%) Tunneling: No Undermining: No Wound Description Classification: Partial Thickness Wound Margin: Flat and Intact Exudate Amount: Small Exudate Type: Serous Exudate Color: amber Foul Odor After Cleansing: No Slough/Fibrino No Wound Bed Granulation Amount: Large (67-100%) Exposed Structure Granulation Quality: Pink Fascia Exposed: No Necrotic Amount: None Present (0%) Fat Layer (Subcutaneous Tissue) Exposed: No Tendon Exposed: No Muscle Exposed: No Joint Exposed: No Bone Exposed: No Periwound Skin Texture Balling, Zilda K. ([id number]) Texture Color No Abnormalities Noted: No No  Abnormalities Noted: No Callus: No Atrophie Blanche: No Crepitus: No Cyanosis: No Excoriation: No Ecchymosis: No Induration: No Erythema: No Rash: No Hemosiderin Staining: No Scarring: No Mottled: No Pallor: No Moisture Rubor: No No Abnormalities Noted: No Dry / Scaly: No Maceration: No Wound Preparation Ulcer Cleansing: Rinsed/Irrigated with Saline, Other: soap and water, Topical Anesthetic Applied: None Electronic Signature(s) Signed: 07/21/2018 5:54:46 PM By: Montey Hora Entered By: Montey Hora on 07/21/2018 09:45:09 [name] ([id number]) -------------------------------------------------------------------------------- Vitals Details Patient Name: [name] Date of Service: 07/21/2018 9:30 AM Medical Record Number: [id number] Patient Account Number: [id number] Date of Birth/Sex: 23-Aug-1951 (67 y.o. F) Treating RN: Montey Hora Primary Care Misao Fackrell: PATIENT, NO Other Clinician: Referring Shemar Plemmons: Irene Limbo Treating Thanos Cousineau/Extender: STONE III, HOYT Weeks in Treatment: 4 Vital Signs Time Taken: 09:34 Temperature (F): 98.0 Height (in): 64 Pulse (bpm): 79 Respiratory Rate (breaths/min): 16 Blood Pressure (mmHg): 128/50 Reference Range: 80 - 120 mg / dl Electronic Signature(s) Signed: 07/21/2018 5:54:46 PM By: Montey Hora Entered By: Montey Hora on 07/21/2018 09:36:20

## 2018-08-03 ENCOUNTER — Inpatient Hospital Stay: Payer: Medicare Other | Attending: Oncology | Admitting: Oncology

## 2018-08-03 ENCOUNTER — Other Ambulatory Visit: Payer: Self-pay

## 2018-08-03 ENCOUNTER — Encounter: Payer: Self-pay | Admitting: Oncology

## 2018-08-03 VITALS — BP 112/73 | HR 80 | Temp 97.1°F | Resp 18 | Wt 188.8 lb

## 2018-08-03 DIAGNOSIS — R590 Localized enlarged lymph nodes: Secondary | ICD-10-CM | POA: Diagnosis not present

## 2018-08-03 DIAGNOSIS — R7989 Other specified abnormal findings of blood chemistry: Secondary | ICD-10-CM

## 2018-08-03 DIAGNOSIS — R599 Enlarged lymph nodes, unspecified: Secondary | ICD-10-CM

## 2018-08-03 DIAGNOSIS — D72820 Lymphocytosis (symptomatic): Secondary | ICD-10-CM

## 2018-08-03 DIAGNOSIS — D649 Anemia, unspecified: Secondary | ICD-10-CM

## 2018-08-03 DIAGNOSIS — E538 Deficiency of other specified B group vitamins: Secondary | ICD-10-CM

## 2018-08-03 MED ORDER — B-12 1000 MCG PO CAPS: 1000.0000 ug | ORAL_CAPSULE | Freq: Every day | ORAL | 1 refills | Status: DC

## 2018-08-03 NOTE — Progress Notes (Signed)
Hematology/Oncology Consult note [facility] Telephone:([phone] [phone]   Patient Care Team: Patient, No Pcp Per as PCP - General (General Practice) Yolanda Junker, RN as Registered Nurse Yolanda Demark, RN as Registered Nurse  REFERRING PROVIDER: Urology [facility] CHIEF COMPLAINTS/REASON FOR VISIT:  Evaluation of bilaterally inguinal lymphadenopathy.    HISTORY OF PRESENTING ILLNESS:  [name] is a  67 y.o.  female with PMH listed below who was referred to me for evaluation of inguinal lymphadenopathy.  Patient has chronic urinary retention, chronic Foley catheter placement follows up with urology.  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  Moderate to [place] left-sided hydronephrosis.  There is incidental finding of bilateral inguinal adenopathy etiology indeterminate.  She has chronic lower extremity swelling/cellulitis/ulcer secondary to chronic vein insufficiency. Patient denies any fever, unintentional weight loss, night sweating, fever chills.  Reporting feeling well except that" I want to the tube to come out". She lives in a facility.   INTERVAL HISTORY [name] is a 67 y.o. female who has above history reviewed by me today presents for follow up visit for management of inguinal lymph adenopathy.  She has had lab work-up done during the interval and present to discuss further management plan.  And lab results. Problems and complaints are listed below: Reports continued to feel well at baseline.  Denies any constitutional symptoms.  She has chronic lower extremity swelling/cellulitis/ulcer secondary to chronic vein insufficiency.  Denies any fever or chills.  Appetite is fair.  During the interval she has followed up with urology and had Foley catheter taken out. She takes tamsulosin for urinary retention.    Review of Systems  Constitutional: Negative for chills, fever, malaise/fatigue and weight  loss.  HENT: Negative for nosebleeds and sore throat.   Eyes: Negative for double vision, photophobia and redness.  Respiratory: Negative for cough, shortness of breath and wheezing.   Cardiovascular: Negative for chest pain, palpitations and orthopnea.  Gastrointestinal: Negative for abdominal pain, blood in stool, nausea and vomiting.  Musculoskeletal: Negative for back pain, myalgias and neck pain.  Skin: Negative for itching and rash.       Chronic bilateral lower extremity wound  Neurological: Negative for dizziness, tingling and tremors.  Endo/Heme/Allergies: Negative for environmental allergies. Does not bruise/bleed easily.  Psychiatric/Behavioral: Negative for depression.    MEDICAL HISTORY:  Past Medical History:  Diagnosis Date  . Adult failure to thrive   . Asthma   . Cellulitis of left foot   . Cellulitis of right foot   . Diabetes mellitus without complication ([place])   . Glaucoma   . HLD (hyperlipidemia)   . Hypertension   . Protein calorie malnutrition ([place])   . Stage 2 skin ulcer of sacral region ([place])   . Thyroid disease    hypothyroid  . Urinary retention     SURGICAL HISTORY: Past Surgical History:  Procedure Laterality Date  . APPENDECTOMY      SOCIAL HISTORY: Social History   Socioeconomic History  . Marital status: Single    Spouse name: Not on file  . Number of children: Not on file  . Years of education: Not on file  . Highest education level: Not on file  Occupational History  . Not on file  Social Needs  . Financial resource strain: Not on file  . Food insecurity:    Worry: Not on file    Inability: Not on file  . Transportation  needs:    Medical: Not on file    Non-medical: Not on file  Tobacco Use  . Smoking status: Former Research scientist (life sciences)  . Smokeless tobacco: Never Used  Substance and Sexual Activity  . Alcohol use: No  . Drug use: No  . Sexual activity: Not on file  Lifestyle  . Physical activity:    Days per week: Not on file     Minutes per session: Not on file  . Stress: Not on file  Relationships  . Social connections:    Talks on phone: Not on file    Gets together: Not on file    Attends religious service: Not on file    Active member of club or organization: Not on file    Attends meetings of clubs or organizations: Not on file    Relationship status: Not on file  . Intimate partner violence:    Fear of current or ex partner: Not on file    Emotionally abused: Not on file    Physically abused: Not on file    Forced sexual activity: Not on file  Other Topics Concern  . Not on file  Social History Narrative  . Not on file    FAMILY HISTORY: Family History  Problem Relation Age of Onset  . Diabetes Mother   . Diabetes Father   . Alzheimer's disease Father   . Breast cancer Neg Hx     ALLERGIES:  is allergic to penicillins.  MEDICATIONS:  Current Outpatient Medications  Medication Sig Dispense Refill  . acetaminophen (TYLENOL) 325 MG tablet Take 650 mg by mouth every 6 (six) hours as needed.    . Cholecalciferol (D3-50) 50000 units capsule Take 50,000 Units by mouth once a week.    . ferrous sulfate 325 (65 FE) MG tablet Take 325 mg by mouth daily with breakfast.    . gabapentin (NEURONTIN) 100 MG capsule Take 200 mg by mouth 3 (three) times daily.    Marland Kitchen glipiZIDE (GLUCOTROL) 5 MG tablet Take by mouth daily before breakfast.    . insulin aspart (NOVOLOG) 100 UNIT/ML injection Inject into the skin 3 (three) times daily before meals.    Marland Kitchen levothyroxine (SYNTHROID, LEVOTHROID) 125 MCG tablet Take 1 tablet (125 mcg total) by mouth daily before breakfast. 30 tablet 0  . tamsulosin (FLOMAX) 0.4 MG CAPS capsule Take 1 capsule (0.4 mg total) by mouth daily. 30 capsule 0  . insulin NPH-regular Human (NOVOLIN 70/30) (70-30) 100 UNIT/ML injection Inject into the skin.     No current facility-administered medications for this visit.      PHYSICAL EXAMINATION: ECOG PERFORMANCE STATUS: 2 - Symptomatic,  <50% confined to bed Vitals:   08/03/18 1042  BP: 112/73  Pulse: 80  Resp: 18  Temp: (!) 97.1 F (36.2 C)   Filed Weights   08/03/18 1042  Weight: 188 lb 12.8 oz (85.6 kg)    Physical Exam  Constitutional: She is oriented to person, place, and time. She appears well-developed and well-nourished. No distress.  HENT:  Head: Normocephalic and atraumatic.  Right Ear: External ear normal.  Left Ear: External ear normal.  Mouth/Throat: Oropharynx is clear and moist.  Eyes: Pupils are equal, round, and reactive to light. EOM are normal. No scleral icterus.  Neck: Normal range of motion. Neck supple.  Cardiovascular: Normal rate, regular rhythm and normal heart sounds.  Pulmonary/Chest: Effort normal. No respiratory distress. She has no wheezes.  Abdominal: Soft. Bowel sounds are normal. She exhibits no distension and  no mass. There is no tenderness.  Musculoskeletal: Normal range of motion. She exhibits no edema or deformity.  Chronic lower extremity swelling due to venous insufficiency.  Neurological: She is alert and oriented to person, place, and time. No cranial nerve deficit. Coordination normal.  Skin: Skin is warm and dry. No rash noted. No erythema.  Bilateral lower extremity wound wrapped with dressing.  Psychiatric: She has a normal mood and affect. Her behavior is normal. Thought content normal.     LABORATORY DATA:  I have reviewed the data as listed Lab Results  Component Value Date   WBC 8.0 07/21/2018   HGB 10.9 (L) 07/21/2018   HCT 33.2 (L) 07/21/2018   MCV 83.6 07/21/2018   PLT 323 07/21/2018   Recent Labs    03/31/18 0453 04/01/18 0906 07/08/18 1126 07/21/18 1136  NA 134* 136  --  138  K 5.0 4.4  --  4.1  CL 109 113*  --  107  CO2 20* 20*  --  24  GLUCOSE 161* 164*  --  68*  BUN 74* 40*  --  23  CREATININE 4.39* 1.25* 0.90 1.10*  CALCIUM 8.3* 7.9*  --  9.5  GFRNONAA 10* 44*  --  51*  GFRAA 11* 51*  --  59*  PROT  --   --   --  8.9*  ALBUMIN  --    --   --  3.1*  AST  --   --   --  25  ALT  --   --   --  25  ALKPHOS  --   --   --  107  BILITOT  --   --   --  0.4   Iron/TIBC/Ferritin/ %Sat    Component Value Date/Time   IRON 47 07/21/2018 1136   TIBC 221 (L) 07/21/2018 1136   FERRITIN 80 07/21/2018 1136   IRONPCTSAT 21 07/21/2018 1136     RADIOGRAPHIC STUDIES: I have personally reviewed the radiological images as listed and agreed with the findings in the report. IMPRESSION: 1. The patient has a horseshoe kidney. The left renal moiety is larger than the right. 2. The right renal moiety demonstrates no evidence of obstruction. 3. The left renal moiety demonstrates a dilated renal collecting system which partially washes out after Lasix. 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  ASSESSMENT & PLAN:  1. Enlarged lymph nodes   2. Anemia, unspecified type   3. Large granular lymphocytosis    #Labs reviewed and discussed with patient.  Flow cytometry showed lymphocytosis due to absolutely increased total T-cell and increased population of CD 8 T cell large granular lymphocytes.  Explained to patient that this can be either a clonal disorder called LGL versus reactive LGL proliferation.  She does not have any neutropenia. Her anemia also is stable at 10.9.  Will check TCR gene rearrangement study at next visit.  Discussed with patient even if she does have T-cell LGL, given that she is asymptomatic the treatment is observation.  Bilateral inguinal lymphadenopathy seen on CT scan could be secondary to her chronic lower extremity inflammation secondary to vein insufficiency.  I recommend repeat a PET scan in 4 weeks [prefer to avoid CT scan with contrast due to CKD/chronic UPJ obstruction].  Low normal B12, will recommend patient to start oral vitamin B12 supplementation.  Orders Placed This Encounter  Procedures  . NM PET Image Initial (PI) Skull Base To Thigh    Standing  Status:   Future     Standing Expiration Date:   08/04/2019    Order Specific Question:   If indicated for the ordered procedure, I authorize the administration of a radiopharmaceutical per Radiology protocol    Answer:   Yes    Order Specific Question:   Preferred imaging location?    Answer:   [facility]    Order Specific Question:   Radiology Contrast Protocol - do NOT remove file path    Answer:   [email or web address]  . CBC with Differential/Platelet    Standing Status:   Future    Standing Expiration Date:   08/04/2019  . Comprehensive metabolic panel    Standing Status:   Future    Standing Expiration Date:   08/04/2019    All questions were answered. The patient knows to call the clinic with any problems questions or concerns.  Return of visit:  4 weeks Total face to face encounter time for this patient visit was 25 min. >50% of the time was  spent in counseling and coordination of care.  Earlie Server, MD, PhD Hematology Oncology [facility] Pager- [phone] 08/03/2018

## 2018-08-04 ENCOUNTER — Other Ambulatory Visit: Payer: Self-pay | Admitting: Oncology

## 2018-08-04 ENCOUNTER — Ambulatory Visit (INDEPENDENT_AMBULATORY_CARE_PROVIDER_SITE_OTHER): Payer: Medicare Other | Admitting: Urology

## 2018-08-04 ENCOUNTER — Encounter: Payer: Self-pay | Admitting: Urology

## 2018-08-04 VITALS — BP 104/60 | HR 68 | Ht 64.0 in | Wt 189.5 lb

## 2018-08-04 DIAGNOSIS — Z87898 Personal history of other specified conditions: Secondary | ICD-10-CM | POA: Diagnosis not present

## 2018-08-04 DIAGNOSIS — Q6211 Congenital occlusion of ureteropelvic junction: Secondary | ICD-10-CM

## 2018-08-04 LAB — BLADDER SCAN AMB NON-IMAGING: Scan Result: 106

## 2018-08-04 MED ORDER — B-12 1000 MCG PO CAPS: 1000.0000 ug | ORAL_CAPSULE | Freq: Every day | ORAL | 1 refills | Status: DC

## 2018-08-04 NOTE — Progress Notes (Signed)
08/04/2018 3:54 PM   [name] June 24, 1951 [id number]  Referring provider: No referring provider defined for this encounter.  Chief Complaint  Patient presents with  . Hydronephrosis    HPI: Patient is a 67 year old African American female with uncontrolled DM, lymphadenitits, cellulitis and bilateral hydronephrosis who presents today to discuss her renal lasix scan.    Background history  Patient is a 67 year old female with a history of uncontrolled diabetes, lymphadenitis and was recently admitted for cellulitis and was found to have bilateral hydronephrosis and urinary retention and Foley catheter was placed with her transportation specialist, [place].   Her Foley was placed on March 30, 2018 after a renal ultrasound was performed due to increasing serum creatinine levels.  PVR was > 1500cc.  RUS on 03/30/2018 noted mild right-sided and moderate left-sided hydronephrosis.  She was initiated on tamsulosin 0.4 mg daily. Foley catheter was exchanged on 06/23/2018.  She denies any difficulty with urination prior to her hospital admission.  Patient denies any gross hematuria, dysuria or suprapubic/flank pain.  Patient denies any fevers, chills, nausea or vomiting.     RUS on 06/23/2018 noted interval resolution of right-sided hydronephrosis. Persistent moderate left-sided hydronephrosis.  Partial distention of the urinary bladder with a Foley catheter present.    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  There is a moderate to marked left-sided hydronephrosis to the level of the UPJ. No stone or mass noted. 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  Right-sided pelvocaliectasis without evidence for right-sided obstructive uropathy.  Aortic Atherosclerosis.  Bilateral inguinal adenopathy. Etiology indeterminate.  Differential considerations include reactive adenopathy versus metastatic disease or lymphoproliferative disorder.  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 The left renal moiety is larger than the right.  The right renal moiety demonstrates no evidence of obstruction.  The left renal moiety demonstrates a dilated renal collecting system which partially washes out after Lasix. 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  Today, she is without complaints.  She states she is voiding well.  Patient denies any gross hematuria, dysuria or suprapubic/flank pain.  Patient denies any fevers, chills, nausea or vomiting.   Her PVR is 106 mL.    PMH: Past Medical History:  Diagnosis Date  . Adult failure to thrive   . Asthma   . Cellulitis of left foot   . Cellulitis of right foot   . Diabetes mellitus without complication ([place])   . Glaucoma   . HLD (hyperlipidemia)   . Hypertension   . Protein calorie malnutrition ([place])   . Stage 2 skin ulcer of sacral region ([place])   . Thyroid disease    hypothyroid  . Urinary retention     Surgical History: Past Surgical History:  Procedure Laterality Date  . APPENDECTOMY      Home Medications:  Allergies as of 08/04/2018      Reactions   Penicillins Swelling   Has patient had a PCN reaction causing immediate rash, facial/tongue/throat swelling, SOB or lightheadedness with hypotension: Unknown Has patient had a PCN reaction causing severe rash involving mucus membranes or skin necrosis: Unknown Has patient had a PCN reaction that required hospitalization: Unknown Has patient had a PCN reaction occurring within the last 10 years: Unknown If all of the above answers are "NO", then may proceed with Cephalosporin use.      Medication List        Accurate as of 08/04/18  3:54 PM. Always use your most recent med list.          acetaminophen 325 MG tablet Commonly known as:  TYLENOL Take 650 mg by mouth every 6 (six) hours as needed.   B-12 1000 MCG Caps Take 1,000 mcg by mouth daily.   D3-50 50000 units capsule Generic drug:   Cholecalciferol Take 50,000 Units by mouth once a week.   ferrous sulfate 325 (65 FE) MG tablet Take 325 mg by mouth daily with breakfast.   gabapentin 100 MG capsule Commonly known as:  NEURONTIN Take 200 mg by mouth 3 (three) times daily.   glipiZIDE 5 MG tablet Commonly known as:  GLUCOTROL Take by mouth daily before breakfast.   insulin aspart 100 UNIT/ML injection Commonly known as:  novoLOG Inject into the skin 3 (three) times daily before meals.   levothyroxine 125 MCG tablet Commonly known as:  SYNTHROID, LEVOTHROID Take 1 tablet (125 mcg total) by mouth daily before breakfast.   NOVOLIN 70/30 (70-30) 100 UNIT/ML injection Generic drug:  insulin NPH-regular Human Inject into the skin.   tamsulosin 0.4 MG Caps capsule Commonly known as:  FLOMAX Take 1 capsule (0.4 mg total) by mouth daily.       Allergies:  Allergies  Allergen Reactions  . Penicillins Swelling    Has patient had a PCN reaction causing immediate rash, facial/tongue/throat swelling, SOB or lightheadedness with hypotension: Unknown Has patient had a PCN reaction causing severe rash involving mucus membranes or skin necrosis: Unknown Has patient had a PCN reaction that required hospitalization: Unknown Has patient had a PCN reaction occurring within the last 10 years: Unknown If all of the above answers are "NO", then may proceed with Cephalosporin use.     Family History: Family History  Problem Relation Age of Onset  . Diabetes Mother   . Diabetes Father   . Alzheimer's disease Father   . Breast cancer Neg Hx     Social History:  reports that she has quit smoking. She has never used smokeless tobacco. She reports that she does not drink alcohol or use drugs.  ROS: UROLOGY Frequent Urination?: No Hard to postpone urination?: No Burning/pain with urination?: No Get up at night to urinate?: Yes Leakage of urine?: No Urine stream starts and stops?: No Trouble starting stream?: No Do  you have to strain to urinate?: No Blood in urine?: No Urinary tract infection?: No Sexually transmitted disease?: No Injury to kidneys or bladder?: No Painful intercourse?: No Weak stream?: No Currently pregnant?: No Vaginal bleeding?: No Last menstrual period?: n  Gastrointestinal Nausea?: No Vomiting?: No Indigestion/heartburn?: No Diarrhea?: No Constipation?: No  Constitutional Fever: No Night sweats?: No Weight loss?: No Fatigue?: No  Skin Skin rash/lesions?: No Itching?: No  Eyes Blurred vision?: No Double vision?: No  Ears/Nose/Throat Sore throat?: No Sinus problems?: No  Hematologic/Lymphatic Swollen glands?: No Easy bruising?: No  Cardiovascular Leg swelling?: No Chest pain?: No  Respiratory Cough?: No Shortness of breath?: No  Endocrine Excessive thirst?: No  Musculoskeletal Back pain?: No Joint pain?: No  Neurological Headaches?: No Dizziness?: No  Psychologic Depression?: No Anxiety?: No  Physical Exam: BP 104/60   Pulse 68   Ht 5\' 4"  (1.626 m)   Wt 189 lb 8 oz (86 kg)   LMP  (LMP Unknown)   BMI 32.53 kg/m   Constitutional: Well nourished. Alert and oriented, No acute distress. HEENT: [place] AT, moist mucus membranes. Trachea midline, no masses. Cardiovascular: No clubbing, cyanosis, or edema.  Respiratory: Normal respiratory effort, no increased work of breathing. Skin: No rashes, bruises or suspicious lesions. Lymph: No cervical or inguinal adenopathy. Neurologic: Grossly intact, no focal deficits, moving all 4 extremities. Psychiatric: Normal mood and affect.   Laboratory Data: Lab Results  Component Value Date   WBC 8.0 07/21/2018   HGB 10.9 (L) 07/21/2018   HCT 33.2 (L) 07/21/2018   MCV 83.6 07/21/2018   PLT 323 07/21/2018    Lab Results  Component Value Date   CREATININE 1.10 (H) 07/21/2018    No results found for: PSA  No results found for: TESTOSTERONE  No results found for: HGBA1C  No results found  for: TSH  No results found for: CHOL, HDL, CHOLHDL, VLDL, LDLCALC  Lab Results  Component Value Date   AST 25 07/21/2018   Lab Results  Component Value Date   ALT 25 07/21/2018   No components found for: ALKALINEPHOPHATASE No components found for: BILIRUBINTOTAL  No results found for: ESTRADIOL  Urinalysis    Component Value Date/Time   COLORURINE YELLOW (A) 07/23/2018 1851   APPEARANCEUR HAZY (A) 07/23/2018 1851   LABSPEC 1.009 07/23/2018 1851   PHURINE 8.0 07/23/2018 1851   GLUCOSEU NEGATIVE 07/23/2018 1851   HGBUR MODERATE (A) 07/23/2018 1851   BILIRUBINUR NEGATIVE 07/23/2018 1851   KETONESUR NEGATIVE 07/23/2018 1851   PROTEINUR NEGATIVE 07/23/2018 1851   NITRITE POSITIVE (A) 07/23/2018 1851   LEUKOCYTESUR LARGE (A) 07/23/2018 1851    I have reviewed the labs.   Pertinent Imaging: Results for ROSEY, EIDE (MRN [id number]) as of 08/08/2018 14:23  Ref. Range 08/04/2018 15:42  Scan Result Unknown 106   Assessment & Plan:    1. Horseshoe kidney Intervention is not warranted at this time unless hydronephrosis becomes worse, patient becomes symptomatic or renal function worsens RTC in 6 months for symptom recheck and repeat creatinine  2. Urinary retention Minimal PVR Patient to return if unable to void or experiences suprapubic discomfort RTC in 6 months for PVR   3. Inguinal adenopathy Followed by Dr. Tasia Catchings at the cancer center  Return in about 6 months (around 02/04/2019) for follow up .  These notes generated with voice recognition software. I apologize for typographical errors.  Zara Council, PA-C  [facility] [address]  [place], [place] [address] [phone]

## 2018-08-05 NOTE — Progress Notes (Signed)
KIELE, HEAVRIN ([id number]) Visit Report for 07/21/2018 Chief Complaint Document Details Patient Name: [name], [name] Date of Service: 07/21/2018 9:30 AM Medical Record Number: [id number] Patient Account Number: [id number] Date of Birth/Sex: 03/23/1951 (66 y.o. F) Treating RN: Ahmed Prima Primary Care Provider: PATIENT, NO Other Clinician: Referring Provider: Irene Limbo Treating Provider/Extender: Melburn Hake, HOYT Weeks in Treatment: 4 Information Obtained from: Patient Chief Complaint Bilateral LE Lymphedema Electronic Signature(s) Signed: 07/21/2018 9:24:47 PM By: Worthy Keeler PA-C Entered By: Worthy Keeler on 07/21/2018 09:53:43 Stehlin, Garvin Fila ([id number]) -------------------------------------------------------------------------------- HPI Details Patient Name: [name] Date of Service: 07/21/2018 9:30 AM Medical Record Number: [id number] Patient Account Number: [id number] Date of Birth/Sex: 05-Oct-1951 (66 y.o. 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 Fortunately she does not have any active open wounds at this point in time. Unfortunately this has apparently been a scenario that comes and goes quite frequently due to the lymphedema that she's had present for quite a number of years. She does have some difficulty apparently wearing standard compression stockings and may need custom compression in order to correctly manage this. She does have lymphedema pumps which are at home in her apartment although she no longer lives there and will not be going back there she is currently residing in assisted  living. Nonetheless she has not been able to use her pumps for quite some time as she has not been able to apparently accept them due to a very complicated scenario that I do not fully understand. Nonetheless she states that she's gonna have to write some kind a letter to the owner of the property till our friend to come in and remove her belonging and get them to her. I believe she needs to do this sooner rather than later to get her compression pumps. Nonetheless otherwise she seems to be doing fairly well all things considered and she did have Unna Boot wraps up on evaluation today. 06/30/18 on evaluation today patient actually appears to be doing fairly well in regard to her bilateral lower extremities. She continues to have significant bilateral lymphedema she does have two small blisters which are not open in regard to left lower extremity but in general this is doing excellent. She actually is about to get her lymphedema pumps that she's actually receiving the new sleeves in the next two days which I think will be of benefit. We had a discussion about compression therapy and what she needs to use on a daily basis again during the evaluation today. I believe that she really does need to have some kind compression she's unable to slide on compression stockings if nothing else we will probably need to see about an ace wrap. 07/07/18 on evaluation today patient actually appears to be doing very well in regard to her bilateral lower extremities. She does have one area of blistering which opened on the left interior shin but this is fairly superficial and does not appear to be significant issue is mainly part of the lymphedema scenario. Nonetheless she has gotten her lymphedema pumps to her new residence and up and running and currently she states that she began using them yesterday. Fortunately there does not appear to be any  evidence of infection at this time No fevers, chills, nausea, or vomiting  noted at this time. 07/14/18 on evaluation today patient appears to be doing very well in regard to her bilateral lower extremity lymphedema. Left lower extremity is definitely the worst although I feel like she is doing better at this point in time. She has been tolerating the dressing changes without complication. With that being said we did order the Velcro compression for her through home health she has not heard anything about this as of yet we will need to follow up on that. In the meantime she's been tolerating the dressing changes fairly well 07/21/18 on evaluation today patient actually appears to be doing well in regard to her bilateral lotion the edema. She's using her compression pumps she's also continuing to tolerate the compression wraps without complication. Fortunately there does not appear to be any evidence of infection at this time. Overall I'm very pleased with how things have gone we still have not received or obtained any compression Velcro wraps as previously ordered. Her home health company has changed. Electronic Signature(s) Signed: 07/21/2018 9:24:47 PM By: Worthy Keeler PA-C Entered By: Worthy Keeler on 07/21/2018 10:10:30 [name] ([id number]) -------------------------------------------------------------------------------- Physical Exam Details Patient Name: [name] Date of Service: 07/21/2018 9:30 AM Medical Record Number: [id number] Patient Account Number: [id number] Date of Birth/Sex: 12-05-1951 (66 y.o. F) Treating RN: Ahmed Prima Primary Care Provider: PATIENT, NO Other Clinician: Referring Provider: Irene Limbo Treating Provider/Extender: STONE III, HOYT Weeks in Treatment: 4 Constitutional Well-nourished and well-hydrated in no acute distress. Respiratory normal breathing without difficulty. clear to auscultation bilaterally. Cardiovascular regular rate and rhythm with normal S1, S2. 1+ pitting edema of the bilateral lower  extremities. Psychiatric this patient is able to make decisions and demonstrates good insight into disease process. Alert and Oriented x 3. pleasant and cooperative. Notes Patient's wound at this time appears to be very superficial and seems to be healing very well which is good news. There does not appear to be any evidence of infection at this time. Overall I'm very pleased with the progress that she seems to be making. Electronic Signature(s) Signed: 07/21/2018 9:24:47 PM By: Worthy Keeler PA-C Entered By: Worthy Keeler on 07/21/2018 10:11:58 [name] ([id number]) -------------------------------------------------------------------------------- Physician Orders Details Patient Name: [name] Date of Service: 07/21/2018 9:30 AM Medical Record Number: [id number] Patient Account Number: [id number] Date of Birth/Sex: 1951-05-31 (66 y.o. F) Treating RN: Cornell Barman Primary Care Provider: PATIENT, NO Other Clinician: Referring Provider: Irene Limbo Treating Provider/Extender: Melburn Hake, HOYT Weeks in Treatment: 4 Verbal / Phone Orders: No Diagnosis Coding ICD-10 Coding Code Description I89.0 Lymphedema, not elsewhere classified E11.42 Type 2 diabetes mellitus with diabetic polyneuropathy I10 Essential (primary) hypertension Wound Cleansing o Clean wound with Normal Saline. o Cleanse wound with mild soap and water Skin Barriers/Peri-Wound Care o Moisturizing lotion Primary Wound Dressing Wound #1 Left,Medial Lower Leg o Silver Alginate Secondary Dressing Wound #1 Left,Medial Lower Leg o ABD pad Dressing Change Frequency o Other: - Tuesdays and Fridays [facility] to change on Fridays and pt will be seen on Tuesdays at the [place] o Return Appointment in 1 week. o Nurse Visit as needed Edema Control o 3 Layer Compression System - Bilateral - wraps need to be 2 finger widths below the toes up to 2 to 3 finger  widths below knee DO NOT DO UNNA BOOTS THIS IS A 3-LAYER WRAP ONLY TO BILATERAL LEGS   o Elevate legs to the level of the heart and pump ankles as often as possible o Compression Pump: Use compression pump on left lower extremity for 30 minutes, twice daily. - use one hour twice daily o Compression Pump: Use compression pump on right lower extremity for 30 minutes, twice daily. - use one hour twice daily o Other: - HHRN please order velcro compression wrap for left leg Off-Loading o Turn and reposition every 2 hours o Other: - elevate lower extremities to assist with edema control in lower legs Additional Orders / Instructions o Vitamin A; Vitamin C, Zinc Reiber, Merian K. ([id number]) o Increase protein intake. [place] Nurse may visit PRN to address patientos wound care needs. o FACE TO FACE ENCOUNTER: MEDICARE and MEDICAID PATIENTS: I certify that this patient is under my care and that I had a face-to-face encounter that meets the physician face-to-face encounter requirements with this patient on this date. The encounter with the patient was in whole or in part for the following MEDICAL CONDITION: (primary reason for [place]) MEDICAL NECESSITY: I certify, that based on my findings, NURSING services are a medically necessary home health service. HOME BOUND STATUS: I certify that my clinical findings support that this patient is homebound (i.e., Due to illness or injury, pt requires aid of supportive devices such as crutches, cane, wheelchairs, walkers, the use of special transportation or the assistance of another person to leave their place of residence. There is a normal inability to leave the home and doing so requires considerable and taxing effort. Other absences are for medical reasons / religious services and are infrequent or of short duration when for other reasons). o If current dressing  causes regression in wound condition, may D/C ordered dressing product/s and apply Normal Saline Moist Dressing daily until next [place] / Other MD appointment. [place] of regression in wound condition at [phone]. o Please direct any NON-WOUND related issues/requests for orders to patient's Primary Care Physician Electronic Signature(s) Signed: 07/21/2018 9:24:47 PM By: Worthy Keeler PA-C Signed: 07/24/2018 6:17:48 PM By: Gretta Cool, BSN, RN, CWS, Kim RN, BSN Entered By: Gretta Cool, BSN, RN, CWS, Kim on 07/21/2018 10:03:57 [name] ([id number]) -------------------------------------------------------------------------------- Problem List Details Patient Name: [name] Date of Service: 07/21/2018 9:30 AM Medical Record Number: [id number] Patient Account Number: [id number] Date of Birth/Sex: 20-May-1951 (66 y.o. F) Treating RN: Ahmed Prima Primary Care Provider: PATIENT, NO Other Clinician: Referring Provider: Irene Limbo Treating Provider/Extender: Melburn Hake, HOYT Weeks in Treatment: 4 Active Problems ICD-10 Evaluated Encounter Code Description Active Date Today Diagnosis I89.0 Lymphedema, not elsewhere classified 06/22/2018 No Yes E11.42 Type 2 diabetes mellitus with diabetic polyneuropathy 06/22/2018 No Yes I10 Essential (primary) hypertension 06/22/2018 No Yes Inactive Problems Resolved Problems Electronic Signature(s) Signed: 07/21/2018 9:24:47 PM By: Worthy Keeler PA-C Entered By: Worthy Keeler on 07/21/2018 09:53:32 [place], Garvin Fila ([id number]) -------------------------------------------------------------------------------- Progress Note Details Patient Name: [name] Date of Service: 07/21/2018 9:30 AM Medical Record Number: [id number] Patient Account Number: [id number] Date of Birth/Sex: Apr 21, 1951 (66 y.o. F) Treating RN: Ahmed Prima Primary Care Provider: PATIENT, NO Other Clinician: Referring Provider: Irene Limbo Treating Provider/Extender: Melburn Hake, HOYT Weeks in Treatment: 4 Subjective Chief Complaint Information obtained from Patient Bilateral LE Lymphedema History of Present Illness (HPI) 06/22/18 patient presents today for initial evaluation and our clinic and have a history of chronic lymphedema, diabetes mellitus type II with peripheral  neuropathy, and hypertension. Fortunately she does not have any active open wounds at this point in time. Unfortunately this has apparently been a scenario that comes and goes quite frequently due to the lymphedema that she's had present for quite a number of years. She does have some difficulty apparently wearing standard compression stockings and may need custom compression in order to correctly manage this. She does have lymphedema pumps which are at home in her apartment although she no longer lives there and will not be going back there she is currently residing in assisted living. Nonetheless she has not been able to use her pumps for quite some time as she has not been able to apparently accept them due to a very complicated scenario that I do not fully understand. Nonetheless she states that she's gonna have to write some kind a letter to the owner of the property till our friend to come in and remove her belonging and get them to her. I believe she needs to do this sooner rather than later to get her compression pumps. Nonetheless otherwise she seems to be doing fairly well all things considered and she did have Unna Boot wraps up on evaluation today. 06/30/18 on evaluation today patient actually appears to be doing fairly well in regard to her bilateral lower extremities. She continues to have significant bilateral lymphedema she does have two small blisters which are not open in regard to left lower extremity but in general this is doing excellent. She actually is about to get her lymphedema pumps that she's actually receiving the new sleeves in  the next two days which I think will be of benefit. We had a discussion about compression therapy and what she needs to use on a daily basis again during the evaluation today. I believe that she really does need to have some kind compression she's unable to slide on compression stockings if nothing else we will probably need to see about an ace wrap. 07/07/18 on evaluation today patient actually appears to be doing very well in regard to her bilateral lower extremities. She does have one area of blistering which opened on the left interior shin but this is fairly superficial and does not appear to be significant issue is mainly part of the lymphedema scenario. Nonetheless she has gotten her lymphedema pumps to her new residence and up and running and currently she states that she began using them yesterday. Fortunately there does not appear to be any evidence of infection at this time No fevers, chills, nausea, or vomiting noted at this time. 07/14/18 on evaluation today patient appears to be doing very well in regard to her bilateral lower extremity lymphedema. Left lower extremity is definitely the worst although I feel like she is doing better at this point in time. She has been tolerating the dressing changes without complication. With that being said we did order the Velcro compression for her through home health she has not heard anything about this as of yet we will need to follow up on that. In the meantime she's been tolerating the dressing changes fairly well 07/21/18 on evaluation today patient actually appears to be doing well in regard to her bilateral lotion the edema. She's using her compression pumps she's also continuing to tolerate the compression wraps without complication. Fortunately there does not appear to be any evidence of infection at this time. Overall I'm very pleased with how things have gone we still have not received or obtained any compression Velcro wraps  as previously  ordered. Her home health company has changed. Patient History Information obtained from Patient. [name], [name] ([id number]) Family History Diabetes - Mother, Heart Disease - Mother, Hypertension - Mother, No family history of Cancer, Kidney Disease, Lung Disease, Seizures, Stroke, Thyroid Problems, Tuberculosis. Social History Never smoker, Marital Status - Single, Alcohol Use - Never, Drug Use - Prior History, Caffeine Use - Daily. Medical And Surgical History Notes Genitourinary Foley Catheter Review of Systems (ROS) Constitutional Symptoms (General Health) Denies complaints or symptoms of Fever, Chills. Respiratory The patient has no complaints or symptoms. Cardiovascular The patient has no complaints or symptoms. Psychiatric The patient has no complaints or symptoms. Objective Constitutional Well-nourished and well-hydrated in no acute distress. Vitals Time Taken: 9:34 AM, Height: 64 in, Temperature: 98.0 F, Pulse: 79 bpm, Respiratory Rate: 16 breaths/min, Blood Pressure: 128/50 mmHg. Respiratory normal breathing without difficulty. clear to auscultation bilaterally. Cardiovascular regular rate and rhythm with normal S1, S2. 1+ pitting edema of the bilateral lower extremities. Psychiatric this patient is able to make decisions and demonstrates good insight into disease process. Alert and Oriented x 3. pleasant and cooperative. General Notes: Patient's wound at this time appears to be very superficial and seems to be healing very well which is good news. There does not appear to be any evidence of infection at this time. Overall I'm very pleased with the progress that she seems to be making. Integumentary (Hair, Skin) Wound #1 status is Open. Original cause of wound was Gradually Appeared. The wound is located on the Left,Medial Lower Leg. The wound measures 0.4cm length x 0.4cm width x 0.1cm depth; 0.126cm^2 area and 0.013cm^3 volume. There is no tunneling or  undermining noted. There is a small amount of serous drainage noted. The wound margin is flat and intact. There is large (67-100%) pink granulation within the wound bed. There is no necrotic tissue within the wound bed. The periwound skin appearance did not exhibit: Callus, Crepitus, Excoriation, Induration, Rash, Scarring, Dry/Scaly, Maceration, Atrophie Harrison, Yolanda K. ([id number]) Blanche, Cyanosis, Ecchymosis, Hemosiderin Staining, Mottled, Pallor, Rubor, Erythema. Assessment Active Problems ICD-10 Lymphedema, not elsewhere classified Type 2 diabetes mellitus with diabetic polyneuropathy Essential (primary) hypertension Plan Wound Cleansing: Clean wound with Normal Saline. Cleanse wound with mild soap and water Skin Barriers/Peri-Wound Care: Moisturizing lotion Primary Wound Dressing: Wound #1 Left,Medial Lower Leg: Silver Alginate Secondary Dressing: Wound #1 Left,Medial Lower Leg: ABD pad Dressing Change Frequency: Other: - Tuesdays and Fridays [facility] to change on Fridays and pt will be seen on Tuesdays at the [place] Follow-up Appointments: Return Appointment in 1 week. Nurse Visit as needed Edema Control: 3 Layer Compression System - Bilateral - wraps need to be 2 finger widths below the toes up to 2 to 3 finger widths below knee DO NOT DO UNNA BOOTS THIS IS A 3-LAYER WRAP ONLY TO BILATERAL LEGS Elevate legs to the level of the heart and pump ankles as often as possible Compression Pump: Use compression pump on left lower extremity for 30 minutes, twice daily. - use one hour twice daily Compression Pump: Use compression pump on right lower extremity for 30 minutes, twice daily. - use one hour twice daily Other: - HHRN please order velcro compression wrap for left leg Off-Loading: Turn and reposition every 2 hours Other: - elevate lower extremities to assist with edema control in lower legs Additional Orders / Instructions: Vitamin A; Vitamin C, Zinc Increase  protein intake. Home Health: [place] Nurse  may visit PRN to address patient s wound care needs. FACE TO FACE ENCOUNTER: MEDICARE and MEDICAID PATIENTS: I certify that this patient is under my care and that I had a face-to-face encounter that meets the physician face-to-face encounter requirements with this patient on this date. The encounter with the patient was in whole or in part for the following MEDICAL CONDITION: (primary reason for [place]) MEDICAL NECESSITY: I certify, that based on my findings, NURSING services are a medically necessary home health service. HOME BOUND STATUS: I certify that my clinical findings support that this patient is homebound (i.e., Due to [place], LEAP. ([id number]) illness or injury, pt requires aid of supportive devices such as crutches, cane, wheelchairs, walkers, the use of special transportation or the assistance of another person to leave their place of residence. There is a normal inability to leave the home and doing so requires considerable and taxing effort. Other absences are for medical reasons / religious services and are infrequent or of short duration when for other reasons). If current dressing causes regression in wound condition, may D/C ordered dressing product/s and apply Normal Saline Moist Dressing daily until next [place] / Other MD appointment. [place] of regression in wound condition at [phone]. Please direct any NON-WOUND related issues/requests for orders to patient's Primary Care Physician I'm gonna suggest that we continue with the above wound care orders for the next week. The patient is in agreement the plan. We will subsequently see her back for reevaluation in one weeks time to see were things stand. I do want to see about getting the Velcro compression wraps for her in regard to left lower extremity at lease I do think she could use  standard compression stockings on the right. Nonetheless we have not been able to obtain those as of yet we're gonna follow up with home health in this regard. Please see above for specific wound care orders. We will see patient for re-evaluation in 1 week(s) here in the clinic. If anything worsens or changes patient will contact our office for additional recommendations. Electronic Signature(s) Signed: 07/21/2018 9:24:47 PM By: Worthy Keeler PA-C Entered By: Worthy Keeler on 07/21/2018 10:12:42 [name], [name] ([id number]) -------------------------------------------------------------------------------- ROS/PFSH Details Patient Name: [name] Date of Service: 07/21/2018 9:30 AM Medical Record Number: [id number] Patient Account Number: [id number] Date of Birth/Sex: Nov 10, 1951 (66 y.o. F) Treating RN: Ahmed Prima Primary Care Provider: PATIENT, NO Other Clinician: Referring Provider: Irene Limbo Treating Provider/Extender: STONE III, HOYT Weeks in Treatment: 4 Information Obtained From Patient Wound History Do you currently have one or more open woundso Yes Have you had any tests for circulation on your legso No Constitutional Symptoms (General Health) Complaints and Symptoms: Negative for: Fever; Chills Eyes Medical History: Positive for: Cataracts - removed in 2009 Negative for: Glaucoma; Optic Neuritis Ear/Nose/Mouth/Throat Medical History: Negative for: Chronic sinus problems/congestion; Middle ear problems Hematologic/Lymphatic Medical History: Positive for: Lymphedema - bilateral Negative for: Anemia; Hemophilia; Human Immunodeficiency Virus; Sickle Cell Disease Respiratory Complaints and Symptoms: No Complaints or Symptoms Medical History: Positive for: Asthma Negative for: Aspiration; Chronic Obstructive Pulmonary Disease (COPD); Pneumothorax; Sleep Apnea; Tuberculosis Cardiovascular Complaints and Symptoms: No Complaints or Symptoms Medical  History: Positive for: Hypertension; Peripheral Venous Disease Negative for: Angina; Arrhythmia; Congestive Heart Failure; Coronary Artery Disease; Deep Vein Thrombosis; Hypotension; Myocardial Infarction; Peripheral Arterial Disease; Phlebitis; Vasculitis Gastrointestinal [name], [name]. ([id number]) Medical History: Negative for: Cirrhosis ; Colitis; Crohnos; Hepatitis A; Hepatitis B; Hepatitis C  Endocrine Medical History: Positive for: Type II Diabetes Negative for: Type I Diabetes Time with diabetes: 10 plus Treated with: Insulin Blood sugar tested every day: No Genitourinary Medical History: Negative for: End Stage Renal Disease Past Medical History Notes: Foley Catheter Immunological Medical History: Negative for: Lupus Erythematosus; Raynaudos; Scleroderma Integumentary (Skin) Medical History: Negative for: History of Burn; History of pressure wounds Musculoskeletal Medical History: Negative for: Gout; Rheumatoid Arthritis; Osteoarthritis; Osteomyelitis Neurologic Medical History: Positive for: Neuropathy Negative for: Dementia; Quadriplegia; Paraplegia; Seizure Disorder Oncologic Medical History: Negative for: Received Chemotherapy; Received Radiation Psychiatric Complaints and Symptoms: No Complaints or Symptoms Medical History: Negative for: Anorexia/bulimia; Confinement Anxiety HBO Extended History Items Eyes: Cataracts Immunizations [name], [name] ([id number]) Pneumococcal Vaccine: Received Pneumococcal Vaccination: Yes Implantable Devices Family and Social History Cancer: No; Diabetes: Yes - Mother; Heart Disease: Yes - Mother; Hypertension: Yes - Mother; Kidney Disease: No; Lung Disease: No; Seizures: No; Stroke: No; Thyroid Problems: No; Tuberculosis: No; Never smoker; Marital Status - Single; Alcohol Use: Never; Drug Use: Prior History; Caffeine Use: Daily; Living Will: Yes (Not Provided); Medical Power of Attorney: Yes (Not Provided) Physician  Affirmation I have reviewed and agree with the above information. Electronic Signature(s) Signed: 07/21/2018 9:24:47 PM By: Worthy Keeler PA-C Signed: 07/27/2018 4:59:33 PM By: Alric Quan Entered By: Worthy Keeler on 07/21/2018 10:11:00 [name] ([id number]) -------------------------------------------------------------------------------- SuperBill Details Patient Name: [name] Date of Service: 07/21/2018 Medical Record Number: [id number] Patient Account Number: [id number] Date of Birth/Sex: [birth year] (66 y.o. F) Treating RN: Cornell Barman Primary Care Provider: PATIENT, NO Other Clinician: Referring Provider: Irene Limbo Treating Provider/Extender: STONE III, HOYT Weeks in Treatment: 4 Diagnosis Coding ICD-10 Codes Code Description I89.0 Lymphedema, not elsewhere classified E11.42 Type 2 diabetes mellitus with diabetic polyneuropathy I10 Essential (primary) hypertension Facility Procedures CPT4: Description Modifier Quantity Code [id number] BILATERAL: Application of multi-layer venous compression system; leg (below 1 knee), including ankle and foot. Physician Procedures CPT4 Code: 1696789 Description: 38101 - WC PHYS LEVEL 3 - EST PT ICD-10 Diagnosis Description I89.0 Lymphedema, not elsewhere classified E11.42 Type 2 diabetes mellitus with diabetic polyneuropathy I10 Essential (primary) hypertension Modifier: Quantity: 1 Electronic Signature(s) Signed: 07/21/2018 9:24:47 PM By: Worthy Keeler PA-C Entered By: Worthy Keeler on 07/21/2018 10:13:02

## 2018-08-07 NOTE — Progress Notes (Signed)
[name], [name] ([id number]) Visit Report for 07/28/2018 Arrival Information Details Patient Name: [name], [name] Date of Service: 07/28/2018 11:00 AM Medical Record Number: [id number] Patient Account Number: [id number] Date of Birth/Sex: 1951/02/03 (66 y.o. F) Treating RN: Montey Hora Primary Care Ellias Mcelreath: PATIENT, NO Other Clinician: Referring Michel Hendon: Irene Limbo Treating Mercedez Boule/Extender: Cathie Olden in Treatment: 5 Visit Information History Since Last Visit Added or deleted any medications: No Patient Arrived: Walker Any new allergies or adverse reactions: No Arrival Time: 11:03 Had a fall or experienced change in No Accompanied By: staff activities of daily living that may affect Transfer Assistance: None risk of falls: Patient Identification Verified: Yes Signs or symptoms of abuse/neglect since last visito No Secondary Verification Process Completed: Yes Hospitalized since last visit: No Patient Has Alerts: Yes Implantable device outside of the clinic excluding No Patient Alerts: Type II Diabetic cellular tissue based products placed in the center since last visit: Has Dressing in Place as Prescribed: Yes Has Compression in Place as Prescribed: Yes Pain Present Now: No Electronic Signature(s) Signed: 07/28/2018 4:05:36 PM By: Montey Hora Entered By: Montey Hora on 07/28/2018 11:04:19 [name] ([id number]) -------------------------------------------------------------------------------- Clinic Level of Care Assessment Details Patient Name: [name] Date of Service: 07/28/2018 11:00 AM Medical Record Number: [id number] Patient Account Number: [id number] Date of Birth/Sex: [birth year] (66 y.o. F) Treating RN: Ahmed Prima Primary Care Wylee Dorantes: PATIENT, NO Other Clinician: Referring Yanilen Adamik: Irene Limbo Treating Jeania Nater/Extender: Cathie Olden in Treatment: 5 Clinic Level of Care Assessment Items TOOL 4 Quantity Score X  - Use when only an EandM is performed on FOLLOW-UP visit 1 0 ASSESSMENTS - Nursing Assessment / Reassessment X - Reassessment of Co-morbidities (includes updates in patient status) 1 10 X- 1 5 Reassessment of Adherence to Treatment Plan ASSESSMENTS - Wound and Skin Assessment / Reassessment X - Simple Wound Assessment / Reassessment - one wound 1 5 []  - 0 Complex Wound Assessment / Reassessment - multiple wounds []  - 0 Dermatologic / Skin Assessment (not related to wound area) ASSESSMENTS - Focused Assessment []  - Circumferential Edema Measurements - multi extremities 0 []  - 0 Nutritional Assessment / Counseling / Intervention []  - 0 Lower Extremity Assessment (monofilament, tuning fork, pulses) []  - 0 Peripheral Arterial Disease Assessment (using hand held doppler) ASSESSMENTS - Ostomy and/or Continence Assessment and Care []  - Incontinence Assessment and Management 0 []  - 0 Ostomy Care Assessment and Management (repouching, etc.) PROCESS - Coordination of Care X - Simple Patient / Family Education for ongoing care 1 15 []  - 0 Complex (extensive) Patient / Family Education for ongoing care []  - 0 Staff obtains Programmer, systems, Records, Test Results / Process Orders []  - 0 Staff telephones HHA, Nursing Homes / Clarify orders / etc []  - 0 Routine Transfer to another Facility (non-emergent condition) []  - 0 Routine Hospital Admission (non-emergent condition) []  - 0 New Admissions / Biomedical engineer / Ordering NPWT, Apligraf, etc. []  - 0 Emergency Hospital Admission (emergent condition) X- 1 10 Simple Discharge Coordination [name], NULL. ([id number]) []  - 0 Complex (extensive) Discharge Coordination PROCESS - Special Needs []  - Pediatric / Minor Patient Management 0 []  - 0 Isolation Patient Management []  - 0 Hearing / Language / Visual special needs []  - 0 Assessment of Community assistance (transportation, D/C planning, etc.) []  - 0 Additional assistance /  Altered mentation []  - 0 Support Surface(s) Assessment (bed, cushion, seat, etc.) INTERVENTIONS - Wound Cleansing / Measurement X - Simple Wound Cleansing - one wound 1  5 []  - 0 Complex Wound Cleansing - multiple wounds X- 1 5 Wound Imaging (photographs - any number of wounds) []  - 0 Wound Tracing (instead of photographs) []  - 0 Simple Wound Measurement - one wound []  - 0 Complex Wound Measurement - multiple wounds INTERVENTIONS - Wound Dressings []  - Small Wound Dressing one or multiple wounds 0 []  - 0 Medium Wound Dressing one or multiple wounds []  - 0 Large Wound Dressing one or multiple wounds []  - 0 Application of Medications - topical []  - 0 Application of Medications - injection INTERVENTIONS - Miscellaneous []  - External ear exam 0 []  - 0 Specimen Collection (cultures, biopsies, blood, body fluids, etc.) []  - 0 Specimen(s) / Culture(s) sent or taken to Lab for analysis []  - 0 Patient Transfer (multiple staff / Civil Service fast streamer / Similar devices) []  - 0 Simple Staple / Suture removal (25 or less) []  - 0 Complex Staple / Suture removal (26 or more) []  - 0 Hypo / Hyperglycemic Management (close monitor of Blood Glucose) []  - 0 Ankle / Brachial Index (ABI) - do not check if billed separately X- 1 5 Vital Signs [name], Yolanda K. ([id number]) Has the patient been seen at the hospital within the last three years: Yes Total Score: 60 Level Of Care: New/Established - Level 2 Electronic Signature(s) Signed: 07/28/2018 3:52:40 PM By: Alric Quan Entered By: Alric Quan on 07/28/2018 11:52:22 [name] ([id number]) -------------------------------------------------------------------------------- Encounter Discharge Information Details Patient Name: [name] Date of Service: 07/28/2018 11:00 AM Medical Record Number: [id number] Patient Account Number: [id number] Date of Birth/Sex: [birth year] (66 y.o. F) Treating RN: Roger Shelter Primary Care Jaqualyn Juday:  PATIENT, NO Other Clinician: Referring Claudell Rhody: Irene Limbo Treating Toye Rouillard/Extender: Cathie Olden in Treatment: 5 Encounter Discharge Information Items Discharge Condition: Stable Ambulatory Status: Ambulatory Discharge Destination: Skilled Nursing Facility Orders Sent: Yes Transportation: Private Auto Schedule Follow-up Appointment: Yes Clinical Summary of Care: Electronic Signature(s) Signed: 07/28/2018 3:57:43 PM By: Roger Shelter Entered By: Roger Shelter on 07/28/2018 11:40:02 [name] ([id number]) -------------------------------------------------------------------------------- Lower Extremity Assessment Details Patient Name: [name] Date of Service: 07/28/2018 11:00 AM Medical Record Number: [id number] Patient Account Number: [id number] Date of Birth/Sex: 20-Mar-1951 (66 y.o. F) Treating RN: Montey Hora Primary Care Angelli Baruch: PATIENT, NO Other Clinician: Referring Nan Maya: Irene Limbo Treating Heriberto Stmartin/Extender: Cathie Olden in Treatment: 5 Edema Assessment Assessed: [Left: No] [Right: No] [Left: Edema] [Right: :] Calf Left: Right: Point of Measurement: 32 cm From Medial Instep 50.5 cm 42.5 cm Ankle Left: Right: Point of Measurement: 12 cm From Medial Instep 31.6 cm 26.3 cm Vascular Assessment Pulses: Dorsalis Pedis Palpable: [Left:Yes] [Right:Yes] Posterior Tibial Extremity colors, hair growth, and conditions: Extremity Color: [Left:Hyperpigmented] [Right:Hyperpigmented] Hair Growth on Extremity: [Left:Yes] [Right:Yes] Temperature of Extremity: [Left:Warm] [Right:Warm] Capillary Refill: [Left:< 3 seconds] [Right:< 3 seconds] Toe Nail Assessment Left: Right: Thick: Yes Yes Discolored: Yes Yes Deformed: Yes Yes Improper Length and Hygiene: Yes Yes Electronic Signature(s) Signed: 07/28/2018 4:05:36 PM By: Montey Hora Entered By: Montey Hora on 07/28/2018 11:11:40 [name]  ([id number]) -------------------------------------------------------------------------------- Multi Wound Chart Details Patient Name: [name] Date of Service: 07/28/2018 11:00 AM Medical Record Number: [id number] Patient Account Number: [id number] Date of Birth/Sex: 09/03/1951 (66 y.o. F) Treating RN: Ahmed Prima Primary Care Mckinna Demars: PATIENT, NO Other Clinician: Referring Ellyssa Zagal: Irene Limbo Treating Damyiah Moxley/Extender: Cathie Olden in Treatment: 5 Vital Signs Height(in): 64 Pulse(bpm): 81 Weight(lbs): Blood Pressure(mmHg): 116/49 Body Mass Index(BMI): Temperature(F): 97.8 Respiratory Rate 16 (breaths/min): Photos: [1:No Photos] [  N/A:N/A] Wound Location: [1:Left, Medial Lower Leg] [N/A:N/A] Wounding Event: [1:Gradually Appeared] [N/A:N/A] Primary Etiology: [1:Lymphedema] [N/A:N/A] Comorbid History: [1:Cataracts, Lymphedema, Asthma, Hypertension, Peripheral Venous Disease, Type II Diabetes, Neuropathy] [N/A:N/A] Date Acquired: [1:07/07/2018] [N/A:N/A] Weeks of Treatment: [1:3] [N/A:N/A] Wound Status: [1:Healed - Epithelialized] [N/A:N/A] Measurements L x W x D [1:0x0x0] [N/A:N/A] (cm) Area (cm) : [1:0] [N/A:N/A] Volume (cm) : [1:0] [N/A:N/A] % Reduction in Area: [1:100.00%] [N/A:N/A] % Reduction in Volume: [1:100.00%] [N/A:N/A] Classification: [1:Partial Thickness] [N/A:N/A] Exudate Amount: [1:Medium] [N/A:N/A] Exudate Type: [1:Serous] [N/A:N/A] Exudate Color: [1:amber] [N/A:N/A] Wound Margin: [1:Flat and Intact] [N/A:N/A] Granulation Amount: [1:Large (67-100%)] [N/A:N/A] Granulation Quality: [1:Pink] [N/A:N/A] Necrotic Amount: [1:None Present (0%)] [N/A:N/A] Exposed Structures: [1:Fascia: No Fat Layer (Subcutaneous Tissue) Exposed: No Tendon: No Muscle: No Joint: No Bone: No] [N/A:N/A] Epithelialization: [1:Medium (34-66%)] [N/A:N/A] Periwound Skin Texture: [1:Excoriation: No Induration: No Callus: No Crepitus: No] [N/A:N/A] Rash:  No Scarring: No Periwound Skin Moisture: Maceration: No N/A N/A Dry/Scaly: No Periwound Skin Color: Atrophie Blanche: No N/A N/A Cyanosis: No Ecchymosis: No Erythema: No Hemosiderin Staining: No Mottled: No Pallor: No Rubor: No Tenderness on Palpation: No N/A N/A Wound Preparation: Ulcer Cleansing: N/A N/A Rinsed/Irrigated with Saline, Other: soap and water Topical Anesthetic Applied: None Treatment Notes Electronic Signature(s) Signed: 07/28/2018 11:49:28 AM By: Lawanda Cousins Entered By: Lawanda Cousins on 07/28/2018 11:49:28 [name] ([id number]) -------------------------------------------------------------------------------- Multi-Disciplinary Care Plan Details Patient Name: [name] Date of Service: 07/28/2018 11:00 AM Medical Record Number: [id number] Patient Account Number: [id number] Date of Birth/Sex: 08/28/1951 (66 y.o. F) Treating RN: Ahmed Prima Primary Care Sheena Simonis: PATIENT, NO Other Clinician: Referring Jalynn Betzold: Irene Limbo Treating Jakelyn Squyres/Extender: Cathie Olden in Treatment: 5 Active Inactive Electronic Signature(s) Signed: 07/28/2018 3:52:40 PM By: Alric Quan Entered By: Alric Quan on 07/28/2018 11:26:40 [name] ([id number]) -------------------------------------------------------------------------------- Pain Assessment Details Patient Name: [name] Date of Service: 07/28/2018 11:00 AM Medical Record Number: [id number] Patient Account Number: [id number] Date of Birth/Sex: 1951-11-02 (66 y.o. F) Treating RN: Montey Hora Primary Care Orvie Caradine: PATIENT, NO Other Clinician: Referring Alicianna Litchford: Irene Limbo Treating Deserae Jennings/Extender: Cathie Olden in Treatment: 5 Active Problems Location of Pain Severity and Description of Pain Patient Has Paino Yes Site Locations Pain Location: Pain in Ulcers With Dressing Change: Yes Duration of the Pain. Constant / Intermittento Intermittent Pain  Management and Medication Current Pain Management: Electronic Signature(s) Signed: 07/28/2018 4:05:36 PM By: Montey Hora Entered By: Montey Hora on 07/28/2018 11:04:35 [name] ([id number]) -------------------------------------------------------------------------------- Patient/Caregiver Education Details Patient Name: [name] Date of Service: 07/28/2018 11:00 AM Medical Record Number: [id number] Patient Account Number: [id number] Date of Birth/Gender: 1951-01-19 (66 y.o. F) Treating RN: Roger Shelter Primary Care Physician: PATIENT, NO Other Clinician: Referring Physician: Irene Limbo Treating Physician/Extender: Cathie Olden in Treatment: 5 Education Assessment Education Provided To: Patient Education Topics Provided Wound/Skin Impairment: Handouts: Skin Care Do's and Dont's Methods: Explain/Verbal Responses: State content correctly Electronic Signature(s) Signed: 07/28/2018 3:57:43 PM By: Roger Shelter Entered By: Roger Shelter on 07/28/2018 11:40:13 [name] ([id number]) -------------------------------------------------------------------------------- Wound Assessment Details Patient Name: [name] Date of Service: 07/28/2018 11:00 AM Medical Record Number: [id number] Patient Account Number: [id number] Date of Birth/Sex: 04/23/1951 (66 y.o. F) Treating RN: Ahmed Prima Primary Care [place] Rico: PATIENT, NO Other Clinician: Referring Tonianne Fine: Irene Limbo Treating Rosabel Sermeno/Extender: Cathie Olden in Treatment: 5 Wound Status Wound Number: 1 Primary Lymphedema Etiology: Wound Location: Left, Medial Lower Leg Wound Healed - Epithelialized Wounding Event: Gradually Appeared Status: Date Acquired: 07/07/2018 Comorbid Cataracts, Lymphedema, Asthma,  Weeks Of Treatment: 3 History: Hypertension, Peripheral Venous Disease, Type Clustered Wound: No II Diabetes, Neuropathy Photos Photo Uploaded By: Montey Hora on  07/28/2018 12:21:59 Wound Measurements Length: (cm) 0 % Redu Width: (cm) 0 % Redu Depth: (cm) 0 Epithe Area: (cm) 0 Tunne Volume: (cm) 0 Under ction in Area: 100% ction in Volume: 100% lialization: Medium (34-66%) ling: No mining: No Wound Description Classification: Partial Thickness Wound Margin: Flat and Intact Exudate Amount: Medium Exudate Type: Serous Exudate Color: amber Foul Odor After Cleansing: No Slough/Fibrino No Wound Bed Granulation Amount: Large (67-100%) Exposed Structure Granulation Quality: Pink Fascia Exposed: No Necrotic Amount: None Present (0%) Fat Layer (Subcutaneous Tissue) Exposed: No Tendon Exposed: No Muscle Exposed: No Joint Exposed: No Bone Exposed: No Periwound Skin Texture [name], Yolanda K. ([id number]) Texture Color No Abnormalities Noted: No No Abnormalities Noted: No Callus: No Atrophie Blanche: No Crepitus: No Cyanosis: No Excoriation: No Ecchymosis: No Induration: No Erythema: No Rash: No Hemosiderin Staining: No Scarring: No Mottled: No Pallor: No Moisture Rubor: No No Abnormalities Noted: No Dry / Scaly: No Maceration: No Wound Preparation Ulcer Cleansing: Rinsed/Irrigated with Saline, Other: soap and water, Topical Anesthetic Applied: None Electronic Signature(s) Signed: 07/28/2018 3:52:40 PM By: Alric Quan Entered By: Alric Quan on 07/28/2018 11:26:13 [name] ([id number]) -------------------------------------------------------------------------------- Vitals Details Patient Name: [name] Date of Service: 07/28/2018 11:00 AM Medical Record Number: [id number] Patient Account Number: [id number] Date of Birth/Sex: 1951/09/28 (66 y.o. F) Treating RN: Montey Hora Primary Care Alicyn Klann: PATIENT, NO Other Clinician: Referring Ishaq Maffei: Irene Limbo Treating Theordore Cisnero/Extender: Cathie Olden in Treatment: 5 Vital Signs Time Taken: 11:19 Temperature (F): 97.8 Height (in):  64 Pulse (bpm): 81 Respiratory Rate (breaths/min): 16 Blood Pressure (mmHg): 116/49 Reference Range: 80 - 120 mg / dl Electronic Signature(s) Signed: 07/28/2018 4:05:36 PM By: Montey Hora Entered By: Montey Hora on 07/28/2018 11:19:50

## 2018-08-07 NOTE — Progress Notes (Signed)
[name] ([id number]) Visit Report for 07/28/2018 Chief Complaint Document Details Patient Name: [name], [name] Date of Service: 07/28/2018 11:00 AM Medical Record Number: [id number] Patient Account Number: [id number] Date of Birth/Sex: [birth year] (67 y.o. F) Treating RN: Ahmed Prima Primary Care Provider: PATIENT, NO Other Clinician: Referring Provider: Irene Limbo Treating Provider/Extender: Cathie Olden in Treatment: 5 Information Obtained from: Patient Chief Complaint Bilateral LE Lymphedema Electronic Signature(s) Signed: 07/28/2018 11:49:42 AM By: Lawanda Cousins Entered By: Lawanda Cousins on 07/28/2018 11:49:42 LAIKLYNN, RACZYNSKI ([id number]) -------------------------------------------------------------------------------- HPI Details Patient Name: [name] Date of Service: 07/28/2018 11:00 AM Medical Record Number: [id number] Patient Account Number: [id number] Date of Birth/Sex: 26-Aug-1951 (67 y.o. 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 Fortunately she does not have any active open wounds at this point in time. Unfortunately this has apparently been a scenario that comes and goes quite frequently due to the lymphedema that she's had present for quite a number of years. She does have some difficulty apparently wearing standard compression stockings and may need custom compression in order to correctly manage this. She does have lymphedema pumps which are at home in her apartment although she no longer lives there and will not be going back there she is currently residing in assisted living.  Nonetheless she has not been able to use her pumps for quite some time as she has not been able to apparently accept them due to a very complicated scenario that I do not fully understand. Nonetheless she states that she's gonna have to write some kind a letter to the owner of the property till our friend to come in and remove her belonging and get them to her. I believe she needs to do this sooner rather than later to get her compression pumps. Nonetheless otherwise she seems to be doing fairly well all things considered and she did have Unna Boot wraps up on evaluation today. 06/30/18 on evaluation today patient actually appears to be doing fairly well in regard to her bilateral lower extremities. She continues to have significant bilateral lymphedema she does have two small blisters which are not open in regard to left lower extremity but in general this is doing excellent. She actually is about to get her lymphedema pumps that she's actually receiving the new sleeves in the next two days which I think will be of benefit. We had a discussion about compression therapy and what she needs to use on a daily basis again during the evaluation today. I believe that she really does need to have some kind compression she's unable to slide on compression stockings if nothing else we will probably need to see about an ace wrap. 07/07/18 on evaluation today patient actually appears to be doing very well in regard to her bilateral lower extremities. She does have one area of blistering which opened on the left interior shin but this is fairly superficial and does not appear to be significant issue is mainly part of the lymphedema scenario. Nonetheless she has gotten her lymphedema pumps to her new residence and up and running and currently she states that she began using them yesterday. Fortunately there does not appear to be any evidence of infection at this  time No fevers, chills, nausea, or vomiting noted at  this time. 07/14/18 on evaluation today patient appears to be doing very well in regard to her bilateral lower extremity lymphedema. Left lower extremity is definitely the worst although I feel like she is doing better at this point in time. She has been tolerating the dressing changes without complication. With that being said we did order the Velcro compression for her through home health she has not heard anything about this as of yet we will need to follow up on that. In the meantime she's been tolerating the dressing changes fairly well 07/21/18 on evaluation today patient actually appears to be doing well in regard to her bilateral lotion the edema. She's using her compression pumps she's also continuing to tolerate the compression wraps without complication. Fortunately there does not appear to be any evidence of infection at this time. Overall I'm very pleased with how things have gone we still have not received or obtained any compression Velcro wraps as previously ordered. Her home health company has changed. 07/28/18-She is seen in follow-up evaluation for bilateral lower treatment lymphedema. There is no open areas. She will be discharged to continue with her lymphedema pumps twice daily, she has been encouraged to increase to 3 times daily with noted increase in edema. Home health continues to work on compression wraps. Electronic Signature(s) Signed: 07/28/2018 11:50:25 AM By: Lawanda Cousins Entered By: Lawanda Cousins on 07/28/2018 11:50:25 CLARETHA, TOWNSHEND ([id number], MONTERO ([id number]) -------------------------------------------------------------------------------- Physician Orders Details Patient Name: [name] Date of Service: 07/28/2018 11:00 AM Medical Record Number: [id number] Patient Account Number: [id number] Date of Birth/Sex: 01-04-1951 (67 y.o. F) Treating RN: Ahmed Prima Primary Care Provider: PATIENT, NO Other Clinician: Referring Provider: Irene Limbo Treating Provider/Extender: Cathie Olden in Treatment: 5 Verbal / Phone Orders: Yes Clinician: Carolyne Fiscal, Debi Read Back and Verified: Yes Diagnosis Coding Discharge From [facility] Services o Discharge from [facility] until compression socks come in. Wear your compression pumps 2 times a day. If swelling continues use your compression pumps 3 times a day 45 mins at a time. Please call our office if you have any questions or concerns. Electronic Signature(s) Signed: 07/28/2018 3:45:39 PM By: Lawanda Cousins Signed: 07/28/2018 3:52:40 PM By: Alric Quan Entered By: Alric Quan on 07/28/2018 11:28:44 [name] ([id number]) -------------------------------------------------------------------------------- Problem List Details Patient Name: [name] Date of Service: 07/28/2018 11:00 AM Medical Record Number: [id number] Patient Account Number: [id number] Date of Birth/Sex: 06/17/1951 (67 y.o. F) Treating RN: Ahmed Prima Primary Care Provider: PATIENT, NO Other Clinician: Referring Provider: Irene Limbo Treating Provider/Extender: Cathie Olden in Treatment: 5 Active Problems ICD-10 Evaluated Encounter Code Description Active Date Today Diagnosis I89.0 Lymphedema, not elsewhere classified 06/22/2018 No Yes E11.42 Type 2 diabetes mellitus with diabetic polyneuropathy 06/22/2018 No Yes I10 Essential (primary) hypertension 06/22/2018 No Yes Inactive Problems Resolved Problems Electronic Signature(s) Signed: 07/28/2018 11:49:21 AM By: Lawanda Cousins Entered By: Lawanda Cousins on 07/28/2018 11:49:20 [name] ([id number]) -------------------------------------------------------------------------------- Progress Note Details Patient Name: [name] Date of Service: 07/28/2018 11:00 AM Medical Record Number: [id number] Patient Account Number: [id number] Date of Birth/Sex: 18-Dec-1951 (67 y.o. 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 Fortunately she does not have any active open wounds at this point in time. Unfortunately this has apparently been a scenario that comes and goes quite frequently due to the lymphedema that she's had present for quite a number of years. She does have some difficulty apparently wearing standard compression stockings and may need custom compression in order to correctly manage this. She does have lymphedema pumps which are at home in her apartment although she no longer lives there and will not be going back there she is currently residing in assisted living. Nonetheless she has not been able to use her pumps for quite some time as she has not been able to apparently accept them due to a very complicated scenario that I do not fully understand. Nonetheless she states that she's gonna have to write some kind a letter to the owner of the property till our friend to come in and remove her belonging and get them to her. I believe she needs to do this sooner rather than later to get her compression pumps. Nonetheless otherwise she seems to be doing fairly well all things considered and she did have Unna Boot wraps up on evaluation today. 06/30/18 on evaluation today patient actually appears to be doing fairly well in regard to her bilateral lower extremities. She continues to have significant bilateral lymphedema she does have two small blisters which are not open in regard to left lower extremity but in general this is doing excellent. She actually is  about to get her lymphedema pumps that she's actually receiving the new sleeves in the next two days which I think will be of benefit. We had a discussion about compression therapy and what she needs to use on a daily basis again during the evaluation today. I believe that she really does need to have some kind compression she's unable to slide on compression stockings if nothing else we will probably need to see about an ace wrap. 07/07/18 on evaluation today patient actually appears to be doing very well in regard to her bilateral lower extremities. She does have one area of blistering which opened on the left interior shin but this is fairly superficial and does not appear to be significant issue is mainly part of the lymphedema scenario. Nonetheless she has gotten her lymphedema pumps to her new residence and up and running and currently she states that she began using them yesterday. Fortunately there does not appear to be any evidence of infection at this time No fevers, chills, nausea, or vomiting noted at this time. 07/14/18 on evaluation today patient appears to be doing very well in regard to her bilateral lower extremity lymphedema. Left lower extremity is definitely the worst although I feel like she is doing better at this point in time. She has been tolerating the dressing changes without complication. With that being said we did order the Velcro compression for her through home health she has not heard anything about this as of yet we will need to follow up on that. In the meantime she's been tolerating the dressing changes fairly well 07/21/18 on evaluation today patient actually appears to be doing well in regard to her bilateral lotion the edema. She's using her compression pumps she's also continuing to tolerate the compression wraps without complication. Fortunately there does not appear to be any evidence of infection at this time. Overall I'm very pleased with how things have gone  we still have not received or obtained any compression Velcro wraps as previously ordered. Her home health company has changed. 07/28/18-She is seen in follow-up evaluation for bilateral lower treatment lymphedema. There is no open areas. She will be discharged to continue with her lymphedema pumps twice daily, she has been encouraged to increase to 3 times daily with noted increase in edema. Home health continues to work on compression wraps. ELVERDA, WENDEL ([id number]) Objective Constitutional Vitals Time Taken: 11:19 AM, Height: 64 in, Temperature: 97.8 F, Pulse: 81 bpm, Respiratory Rate: 16 breaths/min, Blood Pressure: 116/49 mmHg. Integumentary (Hair, Skin) Wound #1 status is Healed - Epithelialized. Original cause of wound was Gradually Appeared. The wound is located on the Left,Medial Lower Leg. The wound measures 0cm length x 0cm width x 0cm depth; 0cm^2 area and 0cm^3 volume. There is no tunneling or undermining noted. There is a medium amount of serous drainage noted. The wound margin is flat and intact. There is large (67-100%) pink granulation within the wound bed. There is no necrotic tissue within the wound bed. The periwound skin appearance did not exhibit: Callus, Crepitus, Excoriation, Induration, Rash, Scarring, Dry/Scaly, Maceration, Atrophie Blanche, Cyanosis, Ecchymosis, Hemosiderin Staining, Mottled, Pallor, Rubor, Erythema. Assessment Active Problems ICD-10 Lymphedema, not elsewhere classified Type 2 diabetes mellitus with diabetic polyneuropathy Essential (primary) hypertension Plan Discharge From [facility] Services: Discharge from [facility] until compression socks come in. Wear your compression pumps 2 times a day. If swelling continues use your compression pumps 3 times a day 45 mins at a time. Please call our office if you have any questions or concerns. Electronic Signature(s) Signed: 07/28/2018 11:50:36 AM By: Lawanda Cousins Entered By:  Lawanda Cousins on 07/28/2018 11:50:36 [name] ([id number]) -------------------------------------------------------------------------------- SuperBill Details Patient Name: [name] Date of Service: 07/28/2018 Medical Record Number: [id number] Patient Account Number: [id number] Date of Birth/Sex: 1951-12-05 (67 y.o. F) Treating RN: Ahmed Prima Primary Care Provider: PATIENT, NO Other Clinician: Referring Provider: Irene Limbo Treating Provider/Extender: Cathie Olden in Treatment: 5 Diagnosis Coding ICD-10 Codes Code Description I89.0 Lymphedema, not elsewhere classified E11.42 Type 2 diabetes mellitus with diabetic polyneuropathy I10 Essential (primary) hypertension Facility Procedures CPT4 Code: [id number] Description: [phone] - WOUND CARE VISIT-LEV 2 EST PT Modifier: Quantity: 1 Physician Procedures CPT4 Code: [id number] Description: 16010 - WC PHYS LEVEL 2 - EST PT ICD-10 Diagnosis Description I89.0 Lymphedema, not elsewhere classified Modifier: Quantity: 1 Electronic Signature(s) Signed: 07/28/2018 11:52:36 AM By: Alric Quan Signed: 07/28/2018 3:45:39 PM By: Lawanda Cousins Previous Signature: 07/28/2018 11:50:51 AM Version By: Lawanda Cousins Entered By: Alric Quan on 07/28/2018 11:52:35

## 2018-08-25 ENCOUNTER — Ambulatory Visit (INDEPENDENT_AMBULATORY_CARE_PROVIDER_SITE_OTHER): Payer: Medicare Other | Admitting: Vascular Surgery

## 2018-08-25 ENCOUNTER — Encounter (INDEPENDENT_AMBULATORY_CARE_PROVIDER_SITE_OTHER): Payer: Self-pay | Admitting: Vascular Surgery

## 2018-08-25 VITALS — BP 142/82 | HR 89 | Resp 16 | Ht 64.0 in | Wt 189.0 lb

## 2018-08-25 DIAGNOSIS — R600 Localized edema: Secondary | ICD-10-CM

## 2018-08-25 DIAGNOSIS — M7989 Other specified soft tissue disorders: Secondary | ICD-10-CM

## 2018-08-25 DIAGNOSIS — I890 Lymphedema, not elsewhere classified: Secondary | ICD-10-CM | POA: Diagnosis not present

## 2018-08-25 DIAGNOSIS — I10 Essential (primary) hypertension: Secondary | ICD-10-CM | POA: Diagnosis not present

## 2018-08-25 DIAGNOSIS — E119 Type 2 diabetes mellitus without complications: Secondary | ICD-10-CM | POA: Diagnosis not present

## 2018-08-25 DIAGNOSIS — Z794 Long term (current) use of insulin: Secondary | ICD-10-CM

## 2018-08-25 NOTE — Assessment & Plan Note (Signed)
Her left leg swelling has become really quite severe and poorly controlled.  Her going to get her wrapped in Unna boots to try to see if we can get this under better control.  We will also obtain a venous duplex in the near future at her convenience to assess for thrombotic or incompetent issues within the veins.

## 2018-08-25 NOTE — Progress Notes (Signed)
MRN : [id number]  [name] is a 67 y.o. (1951/06/29) female who presents with chief complaint of  Chief Complaint  Patient presents with  . Follow-up    ref [facility] for edema  .  History of Present Illness: Patient returns today in follow up on a new referral from her primary care physician for marked lower extremity swelling.  The patient has had swelling for some time and has a lymphedema pump but here recently her left leg has become markedly swollen.  They have been trying to use the compression sleeves twice a day.  She reports no clear inciting event or causative factor.  No fever or chills.  No chest pain or shortness of breath.  Her right leg has stable, chronic swelling but it has not worsened.  Current Outpatient Medications  Medication Sig Dispense Refill  . acetaminophen (TYLENOL) 325 MG tablet Take 650 mg by mouth every 6 (six) hours as needed.    . Cholecalciferol (D3-50) 50000 units capsule Take 50,000 Units by mouth once a week.    . Cyanocobalamin (B-12) 1000 MCG CAPS Take 1,000 mcg by mouth daily. 90 capsule 1  . ferrous sulfate 325 (65 FE) MG tablet Take 325 mg by mouth daily with breakfast.    . gabapentin (NEURONTIN) 100 MG capsule Take 200 mg by mouth 3 (three) times daily.    Marland Kitchen glipiZIDE (GLUCOTROL) 5 MG tablet Take by mouth daily before breakfast.    . insulin aspart (NOVOLOG) 100 UNIT/ML injection Inject into the skin 3 (three) times daily before meals.    . insulin NPH-regular Human (NOVOLIN 70/30) (70-30) 100 UNIT/ML injection Inject into the skin.    Marland Kitchen levothyroxine (SYNTHROID, LEVOTHROID) 125 MCG tablet Take 1 tablet (125 mcg total) by mouth daily before breakfast. 30 tablet 0  . tamsulosin (FLOMAX) 0.4 MG CAPS capsule Take 1 capsule (0.4 mg total) by mouth daily. 30 capsule 0   No current facility-administered medications for this visit.     Past Medical History:  Diagnosis Date  . Adult failure to thrive   . Asthma   . Cellulitis of left foot     . Cellulitis of right foot   . Diabetes mellitus without complication ([place])   . Glaucoma   . HLD (hyperlipidemia)   . Hypertension   . Protein calorie malnutrition ([place])   . Stage 2 skin ulcer of sacral region ([place])   . Thyroid disease    hypothyroid  . Urinary retention     Past Surgical History:  Procedure Laterality Date  . APPENDECTOMY      Social History Social History   Tobacco Use  . Smoking status: Former Research scientist (life sciences)  . Smokeless tobacco: Never Used  Substance Use Topics  . Alcohol use: No  . Drug use: No  Lives in a nursing facility  Family History Family History  Problem Relation Age of Onset  . Diabetes Mother   . Diabetes Father   . Alzheimer's disease Father   . Breast cancer Neg Hx   No bleeding or clotting disorders  Allergies  Allergen Reactions  . Penicillins Swelling    Has patient had a PCN reaction causing immediate rash, facial/tongue/throat swelling, SOB or lightheadedness with hypotension: Unknown Has patient had a PCN reaction causing severe rash involving mucus membranes or skin necrosis: Unknown Has patient had a PCN reaction that required hospitalization: Unknown Has patient had a PCN reaction occurring within the last 10 years: Unknown If all of the above  answers are "NO", then may proceed with Cephalosporin use.      REVIEW OF SYSTEMS (Negative unless checked)  Constitutional: [] Weight loss  [] Fever  [] Chills Cardiac: [] Chest pain   [] Chest pressure   [] Palpitations   [] Shortness of breath when laying flat   [] Shortness of breath at rest   [x] Shortness of breath with exertion. Vascular:  [] Pain in legs with walking   [] Pain in legs at rest   [] Pain in legs when laying flat   [] Claudication   [] Pain in feet when walking  [] Pain in feet at rest  [] Pain in feet when laying flat   [] History of DVT   [] Phlebitis   [x] Swelling in legs   [] Varicose veins   [] Non-healing ulcers Pulmonary:   [] Uses home oxygen   [] Productive cough   [] Hemoptysis    [] Wheeze  [] COPD   [] Asthma Neurologic:  [] Dizziness  [] Blackouts   [] Seizures   [] History of stroke   [] History of TIA  [] Aphasia   [] Temporary blindness   [] Dysphagia   [] Weakness or numbness in arms   [] Weakness or numbness in legs Musculoskeletal:  [x] Arthritis   [] Joint swelling   [] Joint pain   [] Low back pain Hematologic:  [] Easy bruising  [] Easy bleeding   [] Hypercoagulable state   [x] Anemic   Gastrointestinal:  [] Blood in stool   [] Vomiting blood  [] Gastroesophageal reflux/heartburn   [] Abdominal pain Genitourinary:  [] Chronic kidney disease   [] Difficult urination  [] Frequent urination  [] Burning with urination   [] Hematuria Skin:  [] Rashes   [] Ulcers   [] Wounds Psychological:  [] History of anxiety   []  History of major depression.  Physical Examination  BP (!) 142/82 (BP Location: Right Arm)   Pulse 89   Resp 16   Ht 5' 4"  (1.626 m)   Wt 189 lb (85.7 kg)   LMP  (LMP Unknown)   BMI 32.44 kg/m  Gen:  WD/WN, NAD Head: [place]/AT, No temporalis wasting. Ear/Nose/Throat: Hearing grossly intact, nares w/o erythema or drainage Eyes: Conjunctiva clear. Sclera non-icteric Neck: Supple.  Trachea midline Pulmonary:  Good air movement, no use of accessory muscles.  Cardiac: Irregular Vascular:  Vessel Right Left  Radial Palpable Palpable                          PT Not Palpable Not Palpable  DP 1+ Palpable Not Palpable    Musculoskeletal: M/S 5/5 throughout.  No deformity or atrophy.  2+ right lower extremity edema, 4+ left lower extremity edema. Neurologic: Sensation grossly intact in extremities.  Symmetrical.  Speech is fluent.  Psychiatric: Judgment intact, Mood & affect appropriate for pt's clinical situation. Dermatologic: No rashes or ulcers noted.  No cellulitis or open wounds.       Labs Recent Results (from the past 2160 hour(s))  Urinalysis, Complete w Microscopic     Status: Abnormal   Collection Time: 06/18/18  6:23 PM  Result Value Ref Range   Color,  Urine YELLOW (A) YELLOW   APPearance CLEAR (A) CLEAR   Specific Gravity, Urine 1.010 1.005 - 1.030   pH 6.0 5.0 - 8.0   Glucose, UA 50 (A) NEGATIVE mg/dL   Hgb urine dipstick SMALL (A) NEGATIVE   Bilirubin Urine NEGATIVE NEGATIVE   Ketones, ur NEGATIVE NEGATIVE mg/dL   Protein, ur NEGATIVE NEGATIVE mg/dL   Nitrite POSITIVE (A) NEGATIVE   Leukocytes, UA SMALL (A) NEGATIVE   RBC / HPF 0-5 0 - 5 RBC/hpf   WBC, UA 21-50 0 -  5 WBC/hpf   Bacteria, UA FEW (A) NONE SEEN   Squamous Epithelial / LPF NONE SEEN 0 - 5    Comment: Performed at [facility], [address] [place], [place] [address]  Urine culture     Status: Abnormal   Collection Time: 06/18/18  6:23 PM  Result Value Ref Range   Specimen Description      URINE, CATHETERIZED Performed at [facility], [address] [place], [place] [address]    Special Requests      Normal Performed at [facility], [address] [place], [place] [address]    Culture MULTIPLE SPECIES PRESENT, SUGGEST RECOLLECTION (A)    Report Status 06/20/2018 FINAL   I-STAT creatinine     Status: None   Collection Time: 07/08/18 11:26 AM  Result Value Ref Range   Creatinine, Ser 0.90 0.44 - 1.00 mg/dL  Technologist smear review     Status: None   Collection Time: 07/21/18 11:36 AM  Result Value Ref Range   Tech Review MORPHOLOGY UNREMARKABLE     Comment: RBC MORPHOLOGY NORMAL PLATELETS APPEAR ADEQUATE WBC MORPHOLOGY UNREMARKABLE Performed at [facility], [address] [place], [place] [address]   Vitamin B12     Status: None   Collection Time: 07/21/18 11:36 AM  Result Value Ref Range   Vitamin B-12 255 180 - 914 pg/mL    Comment: (NOTE) This assay is not validated for testing neonatal or myeloproliferative syndrome specimens for Vitamin B12 levels. Performed at [facility], [address] [place], [place] [address]   Folate     Status: None   Collection Time: 07/21/18 11:36 AM  Result  Value Ref Range   Folate 17.5 >5.9 ng/mL    Comment: Performed at [facility], [address] [place], [place] [address]  Ferritin     Status: None   Collection Time: 07/21/18 11:36 AM  Result Value Ref Range   Ferritin 80 11 - 307 ng/mL    Comment: Performed at [facility], [place], [place] [address]  Iron and TIBC     Status: Abnormal   Collection Time: 07/21/18 11:36 AM  Result Value Ref Range   Iron 47 28 - 170 ug/dL   TIBC 221 (L) 250 - 450 ug/dL   Saturation Ratios 21 10.4 - 31.8 %   UIBC 174 ug/dL    Comment: Performed at [facility], [address] [place], [place] [address]  Hepatitis panel, acute     Status: None   Collection Time: 07/21/18 11:36 AM  Result Value Ref Range   Hepatitis B Surface Ag Negative Negative   HCV Ab 0.1 0.0 - 0.9 s/co ratio    Comment: (NOTE)                                  Negative:     < 0.8                             Indeterminate: 0.8 - 0.9                                  Positive:     > 0.9 The CDC recommends that a positive HCV antibody result be followed up with a  HCV Nucleic Acid Amplification test (831517). Performed At: [facility] [address] [place], [place] [id number] Rush Farmer MD GY:[id number]    Hep A IgM Negative Negative   Hep B C IgM Negative Negative  HIV antibody     Status: None   Collection Time: 07/21/18 11:36 AM  Result Value Ref Range   HIV Screen 4th Generation wRfx Non Reactive Non Reactive    Comment: (NOTE) Performed At: [facility] [place], [place] [id number] Rush Farmer MD EX:[id number]   Flow cytometry panel-leukemia/lymphoma work-up     Status: None   Collection Time: 07/21/18 11:36 AM  Result Value Ref Range   PATH INTERP XXX-IMP Comment     Comment: (NOTE) Lymphocytosis due to absolutely increased total T cells and an increased population of CD8+ T cell large granular lymphocytes (LGLs).  No  other significant diagnostic immunophenotypic abnormality detected. (See comment.)    ANNOTATION COMMENT IMP Comment     Comment: (NOTE) Abnormal LGLs can be associated with a variety of disease processes including rheumatoid arthritis and other autoimmune disorders, connective tissue disorders, myelodysplasia, and various hematopoietic and nonhematopoietic neoplasms. Rarely, persistent elevation of LGLs can represent a T cell large granular lymphocytic leukemia. If clinically indicated, T cell receptor gene rearrangement studies can be performed on peripheral blood to rule out a clonal T cell process.    CLINICAL INFO Comment     Comment: (NOTE) Accompanying CBC dated 07/21/18 shows: WBC count 8.0, Hgb 10.9, Neu 3.5, Lym 3.9, Mon 0.4.    Misc Source Comment     Comment: Peripheral blood   ASSESSMENT OF LEUKOCYTES Comment     Comment: (NOTE) No monoclonal B cell population is detected. kappa:lambda ratio 2.0 A decreased CD4/T helper to CD8/T suppressor cell ratio is detected. CD4:CD8 ratio 0.5 CD57 positive cells are relatively increased and are composed of a mixture of few CD4 positive T cells, many CD8 positive T cells, and NK cells. CD57 is a marker of large granular lymphocytes. Clinical correlation is recommended. The CD8+ T-cell large granular lymphocytes (LGLs) account for approximately 18% of overall cellularity. These LGLs are positive for CD3, CD8 and CD57 with dim to negative expression of CD5 and CD7. No circulating blasts are detected. There is no immunophenotypic evidence of abnormal myeloid maturation. Analysis of the leukocyte population shows: granulocytes 54%, monocytes 4%, lymphocytes 42%, blasts <0.5%, B cells 5%, T cells 33%, LGLs 22%, NK cells 4%.    % Viable Cells Comment     Comment: 96%   ANALYSIS AND GATING STRATEGY Comment     Comment: 8 color analysis with CD45/SSC gating   IMMUNOPHENOTYPING STUDY Comment     Comment: (NOTE) CD2        Normal         CD3       Normal CD4       Normal         CD5       See Text CD7       See Text       CD8       Normal CD10      Normal         CD11b     Normal CD13      Normal         CD14      Normal CD16      Normal         CD19      Normal  CD20      Normal         CD33      Normal CD34      Normal         CD38      Normal CD45      Normal         CD56      Normal CD57      Normal         CD117     Normal HLA-DR    Normal         KAPPA     Normal LAMBDA    Normal         CD64      Normal    PATHOLOGIST NAME Comment     Comment: Cecilie Kicks, M. D.   COMMENT: Comment     Comment: (NOTE) Each antibody in this assay was utilized to assess for potential abnormalities of studied cell populations or to characterize identified abnormalities. This test was developed and its performance characteristics determined by [facility].  It has not been cleared or approved by the U.S. Food and Drug Administration. The FDA has determined that such clearance or approval is not necessary. This test is used for clinical purposes.  It should not be regarded as investigational or for research. Performed At: -[facility] [address] [place], [place] [id number] Nechama Guard MD XF:[id number] Performed At: [facility] [address] [place], [place] [id number] Nechama Guard MD FB:[id number]   Lactate dehydrogenase     Status: None   Collection Time: 07/21/18 11:36 AM  Result Value Ref Range   LDH 149 98 - 192 U/L    Comment: Performed at [facility], [address] [place], [place] [address]  Comprehensive metabolic panel     Status: Abnormal   Collection Time: 07/21/18 11:36 AM  Result Value Ref Range   Sodium 138 135 - 145 mmol/L   Potassium 4.1 3.5 - 5.1 mmol/L   Chloride 107 98 - 111 mmol/L   CO2 24 22 - 32 mmol/L   Glucose, Bld 68 (L) 70 - 99 mg/dL   BUN 23 8 - 23 mg/dL   Creatinine, Ser 1.10 (H) 0.44 - 1.00 mg/dL   Calcium 9.5 8.9 - 10.3 mg/dL    Total Protein 8.9 (H) 6.5 - 8.1 g/dL   Albumin 3.1 (L) 3.5 - 5.0 g/dL   AST 25 15 - 41 U/L   ALT 25 0 - 44 U/L   Alkaline Phosphatase 107 38 - 126 U/L   Total Bilirubin 0.4 0.3 - 1.2 mg/dL   GFR calc non Af Amer 51 (L) >60 mL/min   GFR calc Af Amer 59 (L) >60 mL/min    Comment: (NOTE) The eGFR has been calculated using the CKD EPI equation. This calculation has not been validated in all clinical situations. eGFR's persistently <60 mL/min signify possible Chronic Kidney Disease.    Anion gap 7 5 - 15    Comment: Performed at [facility], [address] [place], [place] [address]  CBC with Differential/Platelet     Status: Abnormal   Collection Time: 07/21/18 11:36 AM  Result Value Ref Range   WBC 8.0 3.6 - 11.0 K/uL   RBC 3.97 3.80 - 5.20 MIL/uL   Hemoglobin 10.9 (L) 12.0 - 16.0 g/dL   HCT 33.2 (L) 35.0 - 47.0 %   MCV 83.6 80.0 - 100.0 fL   MCH  27.4 26.0 - 34.0 pg   MCHC 32.8 32.0 - 36.0 g/dL   RDW 17.3 (H) 11.5 - 14.5 %   Platelets 323 150 - 440 K/uL   Neutrophils Relative % 43 %   Neutro Abs 3.5 1.4 - 6.5 K/uL   Lymphocytes Relative 48 %   Lymphs Abs 3.9 (H) 1.0 - 3.6 K/uL   Monocytes Relative 5 %   Monocytes Absolute 0.4 0.2 - 0.9 K/uL   Eosinophils Relative 3 %   Eosinophils Absolute 0.2 0 - 0.7 K/uL   Basophils Relative 1 %   Basophils Absolute 0.1 0 - 0.1 K/uL    Comment: Performed at [facility], [address] [place], [place] [address]  Urinalysis, Complete w Microscopic     Status: Abnormal   Collection Time: 07/23/18  6:51 PM  Result Value Ref Range   Color, Urine YELLOW (A) YELLOW   APPearance HAZY (A) CLEAR   Specific Gravity, Urine 1.009 1.005 - 1.030   pH 8.0 5.0 - 8.0   Glucose, UA NEGATIVE NEGATIVE mg/dL   Hgb urine dipstick MODERATE (A) NEGATIVE   Bilirubin Urine NEGATIVE NEGATIVE   Ketones, ur NEGATIVE NEGATIVE mg/dL   Protein, ur NEGATIVE NEGATIVE mg/dL   Nitrite POSITIVE (A) NEGATIVE   Leukocytes, UA LARGE (A) NEGATIVE   RBC /  HPF >50 (H) 0 - 5 RBC/hpf   WBC, UA 21-50 0 - 5 WBC/hpf   Bacteria, UA RARE (A) NONE SEEN   Squamous Epithelial / LPF NONE SEEN 0 - 5   Amorphous [place] PRESENT     Comment: Performed at [facility], [address] [place], [place] [address]  Urine culture     Status: Abnormal   Collection Time: 07/23/18  6:51 PM  Result Value Ref Range   Specimen Description      URINE, CATHETERIZED Performed at [facility], [address] [place], [place] [address]    Special Requests      Normal Performed at [facility], [address] [place], [place] [address]    Culture (A)     >=100,000 COLONIES/mL PROTEUS MIRABILIS 50,000 COLONIES/mL ENTEROCOCCUS FAECALIS    Report Status 07/28/2018 FINAL    Organism ID, Bacteria PROTEUS MIRABILIS (A)    Organism ID, Bacteria ENTEROCOCCUS FAECALIS (A)       Susceptibility   Enterococcus faecalis - MIC*    AMPICILLIN <=2 SENSITIVE Sensitive     LEVOFLOXACIN 1 SENSITIVE Sensitive     NITROFURANTOIN <=16 SENSITIVE Sensitive     VANCOMYCIN 2 SENSITIVE Sensitive     * 50,000 COLONIES/mL ENTEROCOCCUS FAECALIS   Proteus mirabilis - MIC*    AMPICILLIN <=2 SENSITIVE Sensitive     CEFAZOLIN <=4 SENSITIVE Sensitive     CEFTRIAXONE <=1 SENSITIVE Sensitive     CIPROFLOXACIN >=4 RESISTANT Resistant     GENTAMICIN 8 INTERMEDIATE Intermediate     IMIPENEM 4 SENSITIVE Sensitive     NITROFURANTOIN 128 RESISTANT Resistant     TRIMETH/SULFA >=320 RESISTANT Resistant     AMPICILLIN/SULBACTAM <=2 SENSITIVE Sensitive     PIP/TAZO <=4 SENSITIVE Sensitive     * >=100,000 COLONIES/mL PROTEUS MIRABILIS  BLADDER SCAN AMB NON-IMAGING     Status: None   Collection Time: 08/04/18  3:42 PM  Result Value Ref Range   Scan Result 106     Radiology Nm Renal Imaging Flow W/pharm  Result Date: 07/27/2018 CLINICAL DATA:  Known horseshoe kidney. Evaluate for left UPJ obstruction. EXAM: NUCLEAR MEDICINE  RENAL SCAN WITH DIURETIC ADMINISTRATION  TECHNIQUE: Radionuclide angiographic and sequential renal images were obtained after intravenous injection of radiopharmaceutical. Imaging was continued during slow intravenous injection of Lasix approximately 15 minutes after the start of the examination. RADIOPHARMACEUTICALS:  5.38 mCi Technetium-29mMAG3 IV COMPARISON:  CT scan July 08, 2018 FINDINGS: Flow:  Prompt symmetric arterial flow to the kidneys. Left renogram: The patient has a horseshoe kidney. The left renal moiety is larger than the right. There is normal time to maximum cortical uptake. There is normal time to excretion. Activity collects in a dilated left renal collecting system throughout the study with partial washout after Lasix. Right renogram: The patient has a horseshoe kidney. The right renal moiety is smaller than the left. There is normal time to maximum cortical uptake as well as normal time to excretion. There is complete washout of the right kidney beginning before Lasix and continuing after Lasix. Differential: Left kidney = 54.3 % Right kidney = 45.7 % T1/2 post Lasix : Left kidney = 22.6 min Right kidney = 5.2 min IMPRESSION: 1. The patient has a horseshoe kidney. The left renal moiety is larger than the right. 2. The right renal moiety demonstrates no evidence of obstruction. 3. The left renal moiety demonstrates a dilated renal collecting system which partially washes out after Lasix. 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 Electronically Signed   By: DDorise BullionIII M.D   On: 07/27/2018 13:01    Assessment/Plan  Type 2 diabetes mellitus with insulin therapy ([place] blood glucose control important in reducing the progression of atherosclerotic disease. Also, involved in wound healing. On appropriate medications.   Lymphedema Her left leg swelling has become really quite severe and poorly controlled.  Her going to get her wrapped in Unna boots to try to see if we can get this under better  control.  We will also obtain a venous duplex in the near future at her convenience to assess for thrombotic or incompetent issues within the veins.  Hypertension blood pressure control important in reducing the progression of atherosclerotic disease. On appropriate oral medications.   Swelling of limb The swelling is quite impressive.  Unna boots to try to get this under better control.  Reflux study for further evaluation.  She will need long-term compression and lymphedema pumps following acute treatment with Unna boots.    JLeotis Pain MD  08/25/2018 9:47 AM    This note was created with Dragon medical transcription system.  Any errors from dictation are purely unintentional

## 2018-08-25 NOTE — Assessment & Plan Note (Signed)
blood pressure control important in reducing the progression of atherosclerotic disease. On appropriate oral medications.  

## 2018-08-25 NOTE — Patient Instructions (Signed)

## 2018-08-25 NOTE — Assessment & Plan Note (Signed)
The swelling is quite impressive.  Unna boots to try to get this under better control.  Reflux study for further evaluation.  She will need long-term compression and lymphedema pumps following acute treatment with Unna boots.

## 2018-08-25 NOTE — Assessment & Plan Note (Signed)
blood glucose control important in reducing the progression of atherosclerotic disease. Also, involved in wound healing. On appropriate medications.  

## 2018-09-01 ENCOUNTER — Ambulatory Visit (INDEPENDENT_AMBULATORY_CARE_PROVIDER_SITE_OTHER): Payer: Medicare Other | Admitting: Nurse Practitioner

## 2018-09-01 ENCOUNTER — Encounter (INDEPENDENT_AMBULATORY_CARE_PROVIDER_SITE_OTHER): Payer: Self-pay

## 2018-09-01 VITALS — BP 130/79 | HR 101 | Resp 16 | Ht 64.0 in | Wt 188.6 lb

## 2018-09-01 DIAGNOSIS — I83029 Varicose veins of left lower extremity with ulcer of unspecified site: Secondary | ICD-10-CM | POA: Diagnosis not present

## 2018-09-01 DIAGNOSIS — L97929 Non-pressure chronic ulcer of unspecified part of left lower leg with unspecified severity: Secondary | ICD-10-CM | POA: Diagnosis not present

## 2018-09-01 NOTE — Progress Notes (Signed)
History of Present Illness  There is no documented history at this time  Assessments & Plan   There are no diagnoses linked to this encounter.    Additional instructions  Subjective:  Patient presents with venous ulcer of the Left lower extremity.    Procedure:  3 layer unna wrap was placed Left lower extremity.   Plan:   Follow up in one week.  

## 2018-09-07 ENCOUNTER — Ambulatory Visit
Admission: RE | Admit: 2018-09-07 | Discharge: 2018-09-07 | Disposition: A | Discharge status: Home / Self Care | Payer: Medicare Other | Source: Ambulatory Visit | Attending: Oncology | Admitting: Oncology

## 2018-09-07 DIAGNOSIS — R918 Other nonspecific abnormal finding of lung field: Secondary | ICD-10-CM | POA: Diagnosis not present

## 2018-09-07 DIAGNOSIS — N1330 Unspecified hydronephrosis: Secondary | ICD-10-CM | POA: Insufficient documentation

## 2018-09-07 DIAGNOSIS — Q631 Lobulated, fused and horseshoe kidney: Secondary | ICD-10-CM | POA: Diagnosis not present

## 2018-09-07 DIAGNOSIS — R599 Enlarged lymph nodes, unspecified: Secondary | ICD-10-CM | POA: Insufficient documentation

## 2018-09-07 DIAGNOSIS — I2510 Atherosclerotic heart disease of native coronary artery without angina pectoris: Secondary | ICD-10-CM | POA: Diagnosis not present

## 2018-09-07 DIAGNOSIS — I700 Atherosclerosis of aorta: Secondary | ICD-10-CM | POA: Diagnosis not present

## 2018-09-07 LAB — GLUCOSE, CAPILLARY: Glucose-Capillary: 135 mg/dL — ABNORMAL HIGH (ref 70–99)

## 2018-09-07 MED ORDER — FLUDEOXYGLUCOSE F - 18 (FDG) INJECTION
9.8000 | Freq: Once | INTRAVENOUS | Status: AC | PRN
  Administered 2018-09-07: 9.8 via INTRAVENOUS

## 2018-09-08 ENCOUNTER — Encounter (INDEPENDENT_AMBULATORY_CARE_PROVIDER_SITE_OTHER): Payer: Self-pay | Admitting: Nurse Practitioner

## 2018-09-08 ENCOUNTER — Ambulatory Visit (INDEPENDENT_AMBULATORY_CARE_PROVIDER_SITE_OTHER): Payer: Medicare Other | Admitting: Nurse Practitioner

## 2018-09-08 VITALS — BP 156/70 | HR 88 | Resp 16 | Ht 63.0 in | Wt 185.0 lb

## 2018-09-08 DIAGNOSIS — I83029 Varicose veins of left lower extremity with ulcer of unspecified site: Secondary | ICD-10-CM

## 2018-09-08 DIAGNOSIS — L97929 Non-pressure chronic ulcer of unspecified part of left lower leg with unspecified severity: Secondary | ICD-10-CM | POA: Diagnosis not present

## 2018-09-08 NOTE — Progress Notes (Signed)
History of Present Illness  There is no documented history at this time  Assessments & Plan   There are no diagnoses linked to this encounter.    Additional instructions  Subjective:  Patient presents with venous ulcer of the Left lower extremity.    Procedure:  3 layer unna wrap was placed Left lower extremity.   Plan:   Follow up in one week.  

## 2018-09-09 ENCOUNTER — Encounter: Payer: Self-pay | Admitting: Oncology

## 2018-09-09 ENCOUNTER — Other Ambulatory Visit: Payer: Self-pay

## 2018-09-09 ENCOUNTER — Inpatient Hospital Stay: Payer: Medicare Other

## 2018-09-09 ENCOUNTER — Inpatient Hospital Stay: Payer: Medicare Other | Attending: Oncology | Admitting: Oncology

## 2018-09-09 VITALS — BP 143/71 | HR 89 | Temp 96.5°F | Wt 185.4 lb

## 2018-09-09 DIAGNOSIS — Z79899 Other long term (current) drug therapy: Secondary | ICD-10-CM | POA: Insufficient documentation

## 2018-09-09 DIAGNOSIS — D72820 Lymphocytosis (symptomatic): Secondary | ICD-10-CM

## 2018-09-09 DIAGNOSIS — R591 Generalized enlarged lymph nodes: Secondary | ICD-10-CM

## 2018-09-09 DIAGNOSIS — D649 Anemia, unspecified: Secondary | ICD-10-CM

## 2018-09-09 DIAGNOSIS — I872 Venous insufficiency (chronic) (peripheral): Secondary | ICD-10-CM

## 2018-09-09 DIAGNOSIS — Z87891 Personal history of nicotine dependence: Secondary | ICD-10-CM | POA: Diagnosis not present

## 2018-09-09 DIAGNOSIS — R339 Retention of urine, unspecified: Secondary | ICD-10-CM | POA: Diagnosis not present

## 2018-09-09 DIAGNOSIS — R599 Enlarged lymph nodes, unspecified: Secondary | ICD-10-CM

## 2018-09-09 DIAGNOSIS — D6489 Other specified anemias: Secondary | ICD-10-CM | POA: Diagnosis not present

## 2018-09-09 DIAGNOSIS — R590 Localized enlarged lymph nodes: Secondary | ICD-10-CM | POA: Diagnosis not present

## 2018-09-09 DIAGNOSIS — E538 Deficiency of other specified B group vitamins: Secondary | ICD-10-CM

## 2018-09-09 LAB — CBC WITH DIFFERENTIAL/PLATELET
Basophils Absolute: 0 10*3/uL (ref 0–0.1)
Basophils Relative: 1 %
Eosinophils Absolute: 0.2 10*3/uL (ref 0–0.7)
Eosinophils Relative: 3 %
HEMATOCRIT: 31.7 % — AB (ref 35.0–47.0)
Hemoglobin: 10.6 g/dL — ABNORMAL LOW (ref 12.0–16.0)
LYMPHS ABS: 2.2 10*3/uL (ref 1.0–3.6)
LYMPHS PCT: 37 %
MCH: 28.1 pg (ref 26.0–34.0)
MCHC: 33.5 g/dL (ref 32.0–36.0)
MCV: 83.8 fL (ref 80.0–100.0)
MONOS PCT: 8 %
Monocytes Absolute: 0.4 10*3/uL (ref 0.2–0.9)
NEUTROS ABS: 3 10*3/uL (ref 1.4–6.5)
Neutrophils Relative %: 51 %
Platelets: 256 10*3/uL (ref 150–440)
RBC: 3.78 MIL/uL — ABNORMAL LOW (ref 3.80–5.20)
RDW: 16.7 % — ABNORMAL HIGH (ref 11.5–14.5)
WBC: 5.8 10*3/uL (ref 3.6–11.0)

## 2018-09-09 LAB — COMPREHENSIVE METABOLIC PANEL
ALBUMIN: 3.3 g/dL — AB (ref 3.5–5.0)
ALK PHOS: 98 U/L (ref 38–126)
ALT: 13 U/L (ref 0–44)
ANION GAP: 6 (ref 5–15)
AST: 20 U/L (ref 15–41)
BUN: 29 mg/dL — ABNORMAL HIGH (ref 8–23)
CALCIUM: 9.3 mg/dL (ref 8.9–10.3)
CHLORIDE: 106 mmol/L (ref 98–111)
CO2: 29 mmol/L (ref 22–32)
Creatinine, Ser: 1.08 mg/dL — ABNORMAL HIGH (ref 0.44–1.00)
GFR calc Af Amer: >60 mL/min (ref >60)
GFR calc non Af Amer: 52 mL/min — ABNORMAL LOW (ref >60)
GLUCOSE: 183 mg/dL — AB (ref 70–99)
POTASSIUM: 4.1 mmol/L (ref 3.5–5.1)
SODIUM: 141 mmol/L (ref 135–145)
Total Bilirubin: 0.4 mg/dL (ref 0.3–1.2)
Total Protein: 8.4 g/dL — ABNORMAL HIGH (ref 6.5–8.1)

## 2018-09-09 LAB — VITAMIN B12: VITAMIN B 12: 642 pg/mL (ref 180–914)

## 2018-09-09 NOTE — Progress Notes (Signed)
Patient here today for follow up.  Patient states no new concerns today  

## 2018-09-09 NOTE — Progress Notes (Signed)
Hematology/Oncology Consult note [facility] [phone] [phone]   Patient Care Team: Patient, No Pcp Per as PCP - General (General Practice) Yolanda Junker, RN as Registered Nurse Yolanda Demark, RN as Registered Nurse  REFERRING PROVIDER: Urology [facility] CHIEF COMPLAINTS/REASON FOR VISIT:  Evaluation of bilaterally inguinal lymphadenopathy.    HISTORY OF PRESENTING ILLNESS:  [name] is a  67 y.o.  female with PMH listed below who was referred to me for evaluation of inguinal lymphadenopathy.  Patient has chronic urinary retention, chronic Foley catheter placement follows up with urology.  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  Moderate to [place] left-sided hydronephrosis.  There is incidental finding of bilateral inguinal adenopathy etiology indeterminate.  She has chronic lower extremity swelling/cellulitis/ulcer secondary to chronic vein insufficiency. Patient denies any fever, unintentional weight loss, night sweating, fever chills.  Reporting feeling well except that" I want to the tube to come out". She lives in a facility.   she has followed up with urology and had Foley catheter taken out. She takes tamsulosin for urinary retention.   INTERVAL HISTORY [name] is a 67 y.o. female who has above history reviewed by me today presents for follow-up visit for management of lymphadenopathy. She had PET scan done during interval and present to discuss image results and management plan. She continues to feel well at baseline.  Denies any constitutional symptoms.  No weight loss, fatigue, fever or chills.  Denies any joint pain, skin rash She has chronic lower extremity swelling/cellulitis /ulcer secondary to chronic vein insufficiency.  Appetite is fair.   Review of Systems  Constitutional: Negative for chills, fever, malaise/fatigue and weight loss.  HENT: Negative for nosebleeds and sore  throat.   Eyes: Negative for double vision, photophobia and redness.  Respiratory: Negative for cough, shortness of breath and wheezing.   Cardiovascular: Negative for chest pain, palpitations and orthopnea.  Gastrointestinal: Negative for abdominal pain, blood in stool, nausea and vomiting.  Genitourinary: Negative for dysuria.  Musculoskeletal: Negative for back pain, myalgias and neck pain.  Skin: Negative for itching and rash.       Chronic bilateral lower extremity wound  Neurological: Negative for dizziness, tingling and tremors.  Endo/Heme/Allergies: Negative for environmental allergies. Does not bruise/bleed easily.  Psychiatric/Behavioral: Negative for depression.    MEDICAL HISTORY:  Past Medical History:  Diagnosis Date  . Adult failure to thrive   . Asthma   . Cellulitis of left foot   . Cellulitis of right foot   . Diabetes mellitus without complication ([place])   . Glaucoma   . HLD (hyperlipidemia)   . Hypertension   . Protein calorie malnutrition ([place])   . Stage 2 skin ulcer of sacral region ([place])   . Thyroid disease    hypothyroid  . Urinary retention     SURGICAL HISTORY: Past Surgical History:  Procedure Laterality Date  . APPENDECTOMY      SOCIAL HISTORY: Social History   Socioeconomic History  . Marital status: Single    Spouse name: Not on file  . Number of children: Not on file  . Years of education: Not on file  . Highest education level: Not on file  Occupational History  . Not on file  Social Needs  . Financial resource strain: Not on file  . Food insecurity:    Worry: Not on file    Inability: Not on file  . Transportation needs:  Medical: Not on file    Non-medical: Not on file  Tobacco Use  . Smoking status: Former Research scientist (life sciences)  . Smokeless tobacco: Never Used  Substance and Sexual Activity  . Alcohol use: No  . Drug use: No  . Sexual activity: Not on file  Lifestyle  . Physical activity:    Days per week: Not on file    Minutes  per session: Not on file  . Stress: Not on file  Relationships  . Social connections:    Talks on phone: Not on file    Gets together: Not on file    Attends religious service: Not on file    Active member of club or organization: Not on file    Attends meetings of clubs or organizations: Not on file    Relationship status: Not on file  . Intimate partner violence:    Fear of current or ex partner: Not on file    Emotionally abused: Not on file    Physically abused: Not on file    Forced sexual activity: Not on file  Other Topics Concern  . Not on file  Social History Narrative  . Not on file    FAMILY HISTORY: Family History  Problem Relation Age of Onset  . Diabetes Mother   . Diabetes Father   . Alzheimer's disease Father   . Breast cancer Neg Hx     ALLERGIES:  is allergic to penicillins.  MEDICATIONS:  Current Outpatient Medications  Medication Sig Dispense Refill  . acetaminophen (TYLENOL) 325 MG tablet Take 650 mg by mouth every 6 (six) hours as needed.    . Cholecalciferol (D3-50) 50000 units capsule Take 50,000 Units by mouth once a week.    . Cyanocobalamin (B-12) 1000 MCG CAPS Take 1,000 mcg by mouth daily. 90 capsule 1  . ferrous sulfate 325 (65 FE) MG tablet Take 325 mg by mouth daily with breakfast.    . gabapentin (NEURONTIN) 100 MG capsule Take 200 mg by mouth 3 (three) times daily.    Marland Kitchen glipiZIDE (GLUCOTROL) 5 MG tablet Take by mouth daily before breakfast.    . insulin aspart (NOVOLOG) 100 UNIT/ML injection Inject into the skin 3 (three) times daily before meals.    . insulin NPH-regular Human (NOVOLIN 70/30) (70-30) 100 UNIT/ML injection Inject into the skin.    Marland Kitchen levothyroxine (SYNTHROID, LEVOTHROID) 125 MCG tablet Take 1 tablet (125 mcg total) by mouth daily before breakfast. 30 tablet 0  . tamsulosin (FLOMAX) 0.4 MG CAPS capsule Take 1 capsule (0.4 mg total) by mouth daily. 30 capsule 0   No current facility-administered medications for this visit.       PHYSICAL EXAMINATION: ECOG PERFORMANCE STATUS: 2 - Symptomatic, <50% confined to bed Vitals:   09/09/18 1009  BP: (!) 143/71  Pulse: 89  Temp: (!) 96.5 F (35.8 C)   Filed Weights   09/09/18 1009  Weight: 185 lb 6 oz (84.1 kg)    Physical Exam  Constitutional: She is oriented to person, place, and time. No distress.  HENT:  Head: Normocephalic and atraumatic.  Right Ear: External ear normal.  Left Ear: External ear normal.  Mouth/Throat: Oropharynx is clear and moist.  Eyes: Pupils are equal, round, and reactive to light. EOM are normal. No scleral icterus.  Neck: Normal range of motion. Neck supple.  Cardiovascular: Normal rate, regular rhythm and normal heart sounds.  Pulmonary/Chest: Effort normal. No respiratory distress. She has no wheezes.  Abdominal: Soft. Bowel sounds are normal.  She exhibits no distension and no mass. There is no tenderness.  Musculoskeletal: Normal range of motion. She exhibits no edema or deformity.  Chronic lower extremity swelling due to venous insufficiency.  Neurological: She is alert and oriented to person, place, and time. No cranial nerve deficit. Coordination normal.  Skin: Skin is warm and dry. No rash noted. No erythema.  Bilateral lower extremity wound wrapped with dressing.  Psychiatric: She has a normal mood and affect. Her behavior is normal. Thought content normal.     LABORATORY DATA:  I have reviewed the data as listed Lab Results  Component Value Date   WBC 5.8 09/09/2018   HGB 10.6 (L) 09/09/2018   HCT 31.7 (L) 09/09/2018   MCV 83.8 09/09/2018   PLT 256 09/09/2018   Recent Labs    04/01/18 0906 07/08/18 1126 07/21/18 1136 09/09/18 0931  NA 136  --  138 141  K 4.4  --  4.1 4.1  CL 113*  --  107 106  CO2 20*  --  24 29  GLUCOSE 164*  --  68* 183*  BUN 40*  --  23 29*  CREATININE 1.25* 0.90 1.10* 1.08*  CALCIUM 7.9*  --  9.5 9.3  GFRNONAA 44*  --  51* 52*  GFRAA 51*  --  59* >60  PROT  --   --  8.9* 8.4*    ALBUMIN  --   --  3.1* 3.3*  AST  --   --  25 20  ALT  --   --  25 13  ALKPHOS  --   --  107 98  BILITOT  --   --  0.4 0.4   Iron/TIBC/Ferritin/ %Sat    Component Value Date/Time   IRON 47 07/21/2018 1136   TIBC 221 (L) 07/21/2018 1136   FERRITIN 80 07/21/2018 1136   IRONPCTSAT 21 07/21/2018 1136     RADIOGRAPHIC STUDIES: I have personally reviewed the radiological images as listed and agreed with the findings in the report. CT hematuria work up IMPRESSION: 1. The patient has a horseshoe kidney. The left renal moiety is larger than the right. 2. The right renal moiety demonstrates no evidence of obstruction. 3. The left renal moiety demonstrates a dilated renal collecting system which partially washes out after Lasix. 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  09/07/2018 PET scan 1. Hypermetabolic right internal jugular and mediastinal adenopathy is worrisome for lymphoma. 2. Mild nonspecific hypermetabolism in the right level 2 station in the neck does not have a definite CT correlate. Continued attention on follow-up exams is warranted. 3. Subcentimeter left upper lobe nodules are too small PET resolution. 4. Aortic atherosclerosis (ICD10-170.0). Coronary artery calcification. 5. Horseshoe kidney with left-sided hydronephrosis, better seen and discussed on 07/08/2018.  ASSESSMENT & PLAN:  1. Lymphadenopathy   2. Large granular lymphocytosis   3. Anemia, unspecified type    #PET scan was reviewed independently and discussed with patient. Inguinal lymph nodes are not hypermetabolic, however incidental finding of hypermetabolic right internal jugular lymph node and mediastinal lymphadenopathy. Previous lab work-up showed flow cytometry positive for lymphocytosis due to absolute increase of total T cells and increased population of CD 8 T cells large granular lymphocytes.  This can be either a clonal disorder called LGL versus reactive LGL  proliferation due to underlying autoimmune problems. We send out test for checking TCR gene rearrangement Referred to ENT for evaluation of excisional lymph node biopsy of right internal jugular  lymph node.  #Anemia, iron panel reviewed consistent with anemia of chronic disease.    # Low normal B12, recommend patient continue oral B12 supplementation.  Repeat B12 today.  All questions were answered. The patient knows to call the clinic with any problems questions or concerns.  Return of visit:  4 weeks Total face to face encounter time for this patient visit was 25 min. >50% of the time was  spent in counseling and coordination of care.    Earlie Server, MD, PhD Hematology Oncology [facility] at [facility] Pager- [phone] 09/09/2018

## 2018-09-15 ENCOUNTER — Encounter (INDEPENDENT_AMBULATORY_CARE_PROVIDER_SITE_OTHER): Payer: Medicare Other

## 2018-09-16 ENCOUNTER — Ambulatory Visit (INDEPENDENT_AMBULATORY_CARE_PROVIDER_SITE_OTHER): Payer: Medicare Other | Admitting: Nurse Practitioner

## 2018-09-16 ENCOUNTER — Encounter (INDEPENDENT_AMBULATORY_CARE_PROVIDER_SITE_OTHER): Payer: Self-pay | Admitting: Nurse Practitioner

## 2018-09-16 VITALS — BP 148/79 | HR 87 | Resp 16 | Ht 64.0 in | Wt 187.0 lb

## 2018-09-16 DIAGNOSIS — I83029 Varicose veins of left lower extremity with ulcer of unspecified site: Secondary | ICD-10-CM

## 2018-09-16 DIAGNOSIS — L97929 Non-pressure chronic ulcer of unspecified part of left lower leg with unspecified severity: Secondary | ICD-10-CM

## 2018-09-16 NOTE — Progress Notes (Signed)
History of Present Illness  There is no documented history at this time  Assessments & Plan   There are no diagnoses linked to this encounter.    Additional instructions  Subjective:  Patient presents with venous ulcer of the Left lower extremity.    Procedure:  3 layer unna wrap was placed Left lower extremity.   Plan:   Follow up in one week.  

## 2018-09-21 ENCOUNTER — Other Ambulatory Visit: Payer: Self-pay | Admitting: Oncology

## 2018-09-21 LAB — MISC LABCORP TEST (SEND OUT)
LABCORP TEST CODE: 481080
LabCorp test name: 481080

## 2018-09-22 ENCOUNTER — Encounter (INDEPENDENT_AMBULATORY_CARE_PROVIDER_SITE_OTHER): Payer: Medicare Other

## 2018-09-23 ENCOUNTER — Ambulatory Visit (INDEPENDENT_AMBULATORY_CARE_PROVIDER_SITE_OTHER): Payer: Medicare Other

## 2018-09-23 ENCOUNTER — Ambulatory Visit (INDEPENDENT_AMBULATORY_CARE_PROVIDER_SITE_OTHER): Payer: Medicare Other | Admitting: Vascular Surgery

## 2018-09-23 ENCOUNTER — Encounter (INDEPENDENT_AMBULATORY_CARE_PROVIDER_SITE_OTHER): Payer: Self-pay | Admitting: Vascular Surgery

## 2018-09-23 VITALS — BP 123/77 | HR 63 | Resp 18 | Ht 64.0 in | Wt 193.0 lb

## 2018-09-23 DIAGNOSIS — R600 Localized edema: Secondary | ICD-10-CM | POA: Diagnosis not present

## 2018-09-23 DIAGNOSIS — Z87891 Personal history of nicotine dependence: Secondary | ICD-10-CM

## 2018-09-23 DIAGNOSIS — I872 Venous insufficiency (chronic) (peripheral): Secondary | ICD-10-CM

## 2018-09-23 DIAGNOSIS — M7989 Other specified soft tissue disorders: Secondary | ICD-10-CM

## 2018-09-23 DIAGNOSIS — I890 Lymphedema, not elsewhere classified: Secondary | ICD-10-CM

## 2018-09-23 NOTE — Progress Notes (Signed)
Subjective:    Patient ID: [name], female    DOB: [birth year], 67 y.o.   MRN: [id number] Chief Complaint  Patient presents with  . Follow-up    LLE venous reflux   The patient presents for a monthly left lower extremity lymphedema/Unna boot therapy follow-up.  Patient was seen with her aide.  The patient and her aide note a minimal improvement to the edema in the left lower extremity.  The patient has been undergoing 3 layer zinc oxide and wraps to the left lower extremity changed on a weekly basis.  The patient notes that she has been elevating her legs.  The patient also notes that she has been using her lymphedema pump twice a day for an hour each time.  The patient has noted minimal improvement in her edema.  The patient underwent a left lower extremity venous duplex which was notable for no evidence of deep vein thrombosis, no evidence of superficial thrombophlebitis.  There was reflux noted in the left common femoral vein.  The patient denies any claudication-like symptoms, rest pain or ulcer formation to the bilateral lower extremity.  The patient denies any worsening in her edema.  The patient denies any recent bouts of cellulitis.  Patient denies any fever, nausea vomiting.  Review of Systems  Constitutional: Negative.   HENT: Negative.   Eyes: Negative.   Respiratory: Negative.   Cardiovascular: Positive for leg swelling.  Gastrointestinal: Negative.   Endocrine: Negative.   Genitourinary: Negative.   Musculoskeletal: Negative.   Skin: Negative.   Allergic/Immunologic: Negative.   Neurological: Negative.   Hematological: Negative.   Psychiatric/Behavioral: Negative.       Objective:   Physical Exam  Constitutional: She is oriented to person, place, and time. She appears well-developed and well-nourished. No distress.  HENT:  Head: Normocephalic and atraumatic.  Right Ear: External ear normal.  Left Ear: External ear normal.  Eyes: Pupils are equal, round, and  reactive to light. Conjunctivae and EOM are normal.  Neck: Normal range of motion.  Cardiovascular: Normal rate, regular rhythm, normal heart sounds and intact distal pulses.  Pulses:      Radial pulses are 2+ on the right side, and 2+ on the left side.  Hard to palpate pedal pulses due to body habitus and edema however the bilateral feet are warm.  Good capillary refill was noted.  Pulmonary/Chest: Effort normal and breath sounds normal.  Musculoskeletal: Normal range of motion. She exhibits edema (Left lower extremity: Moderate nonpitting edema noted.  Right lower extremity mild to moderate nonpitting edema noted.).  Neurological: She is alert and oriented to person, place, and time.  Skin: She is not diaphoretic.  Left lower extremity: Mild to moderate stasis dermatitis noted.  There is skin thickening.  There is no active cellulitis.  There is no active ulcerations noted at this time.  Psychiatric: She has a normal mood and affect. Her behavior is normal. Judgment and thought content normal.  Vitals reviewed.  BP 123/77 (BP Location: Right Arm, Patient Position: Sitting)   Pulse 63   Resp 18   Ht 5\' 4"  (1.626 m)   Wt 193 lb (87.5 kg)   LMP  (LMP Unknown)   BMI 33.13 kg/m   Past Medical History:  Diagnosis Date  . Adult failure to thrive   . Asthma   . Cellulitis of left foot   . Cellulitis of right foot   . Diabetes mellitus without complication ([place])   . Glaucoma   .  HLD (hyperlipidemia)   . Hypertension   . Protein calorie malnutrition ([place])   . Stage 2 skin ulcer of sacral region ([place])   . Thyroid disease    hypothyroid  . Urinary retention    Social History   Socioeconomic History  . Marital status: Single    Spouse name: Not on file  . Number of children: Not on file  . Years of education: Not on file  . Highest education level: Not on file  Occupational History  . Not on file  Social Needs  . Financial resource strain: Not on file  . Food insecurity:     Worry: Not on file    Inability: Not on file  . Transportation needs:    Medical: Not on file    Non-medical: Not on file  Tobacco Use  . Smoking status: Former Research scientist (life sciences)  . Smokeless tobacco: Never Used  Substance and Sexual Activity  . Alcohol use: No  . Drug use: No  . Sexual activity: Not on file  Lifestyle  . Physical activity:    Days per week: Not on file    Minutes per session: Not on file  . Stress: Not on file  Relationships  . Social connections:    Talks on phone: Not on file    Gets together: Not on file    Attends religious service: Not on file    Active member of club or organization: Not on file    Attends meetings of clubs or organizations: Not on file    Relationship status: Not on file  . Intimate partner violence:    Fear of current or ex partner: Not on file    Emotionally abused: Not on file    Physically abused: Not on file    Forced sexual activity: Not on file  Other Topics Concern  . Not on file  Social History Narrative  . Not on file   Past Surgical History:  Procedure Laterality Date  . APPENDECTOMY     Family History  Problem Relation Age of Onset  . Diabetes Mother   . Diabetes Father   . Alzheimer's disease Father   . Breast cancer Neg Hx    Allergies  Allergen Reactions  . Penicillins Swelling    Has patient had a PCN reaction causing immediate rash, facial/tongue/throat swelling, SOB or lightheadedness with hypotension: Unknown Has patient had a PCN reaction causing severe rash involving mucus membranes or skin necrosis: Unknown Has patient had a PCN reaction that required hospitalization: Unknown Has patient had a PCN reaction occurring within the last 10 years: Unknown If all of the above answers are "NO", then may proceed with Cephalosporin use.       Assessment & Plan:  The patient presents for a monthly left lower extremity lymphedema/Unna boot therapy follow-up.  Patient was seen with her aide.  The patient and her aide  note a minimal improvement to the edema in the left lower extremity.  The patient has been undergoing 3 layer zinc oxide and wraps to the left lower extremity changed on a weekly basis.  The patient notes that she has been elevating her legs.  The patient also notes that she has been using her lymphedema pump twice a day for an hour each time.  The patient has noted minimal improvement in her edema.  The patient underwent a left lower extremity venous duplex which was notable for no evidence of deep vein thrombosis, no evidence of superficial thrombophlebitis.  There  was reflux noted in the left common femoral vein.  The patient denies any claudication-like symptoms, rest pain or ulcer formation to the bilateral lower extremity.  The patient denies any worsening in her edema.  The patient denies any recent bouts of cellulitis.  Patient denies any fever, nausea vomiting.  1. Chronic venous insufficiency - New Due to the location of the patient's left lower extremity venous reflux being located in the left common femoral vein she is not a candidate for any endovenous laser ablation or sclerotherapy The patient is to continue engaging in 3 layer zinc oxide and wraps to the left lower extremity to gain control the patient's edema The patient should elevate her legs heart level or higher as much as possible The patient should continue using her lymphedema pump at least 2-3 times a day for an hour each time and elevated position The patient should follow-up in 1 month so I can assess her progress with the Unna boots. The patient was given a prescription for medical grade 1 compression socks and the information as to where to buy compression socks so she can purchase the appropriate size pair for when we transition her out of Unna wraps Directions on how to wear compression socks, appropriate elevation and how to use a lymphedema pump were sent with the patient back to her residence. Patient is asking to have  [facility] to her nursing residents to provide boot changes we will work on this She is to follow-up in 1 month  2. Lymphedema - New As above  Current Outpatient Medications on File Prior to Visit  Medication Sig Dispense Refill  . acetaminophen (TYLENOL) 325 MG tablet Take 650 mg by mouth every 6 (six) hours as needed.    . Cholecalciferol (D3-50) 50000 units capsule Take 50,000 Units by mouth once a week.    . Cyanocobalamin (B-12) 1000 MCG CAPS Take 1,000 mcg by mouth daily. 90 capsule 1  . ferrous sulfate 325 (65 FE) MG tablet Take 325 mg by mouth daily with breakfast.    . gabapentin (NEURONTIN) 100 MG capsule Take 200 mg by mouth 3 (three) times daily.    Marland Kitchen glipiZIDE (GLUCOTROL) 5 MG tablet Take by mouth daily before breakfast.    . insulin aspart (NOVOLOG) 100 UNIT/ML injection Inject into the skin 3 (three) times daily before meals.    . insulin NPH-regular Human (NOVOLIN 70/30) (70-30) 100 UNIT/ML injection Inject into the skin.    Marland Kitchen levothyroxine (SYNTHROID, LEVOTHROID) 125 MCG tablet Take 1 tablet (125 mcg total) by mouth daily before breakfast. 30 tablet 0  . tamsulosin (FLOMAX) 0.4 MG CAPS capsule Take 1 capsule (0.4 mg total) by mouth daily. 30 capsule 0   No current facility-administered medications on file prior to visit.    There are no Patient Instructions on file for this visit. No follow-ups on file.  KIMBERLY A STEGMAYER, PA-C

## 2018-09-24 ENCOUNTER — Other Ambulatory Visit: Payer: Self-pay | Admitting: Oncology

## 2018-09-24 DIAGNOSIS — R599 Enlarged lymph nodes, unspecified: Secondary | ICD-10-CM

## 2018-09-28 ENCOUNTER — Telehealth: Payer: Self-pay

## 2018-09-28 NOTE — Telephone Encounter (Signed)
Called the [employer] and spoke with [facility] regarding patient's biopsy date and time. 10/14 @ 1000

## 2018-10-01 ENCOUNTER — Other Ambulatory Visit: Payer: Self-pay | Admitting: Student

## 2018-10-05 ENCOUNTER — Ambulatory Visit
Admission: RE | Admit: 2018-10-05 | Discharge: 2018-10-05 | Disposition: A | Discharge status: Home / Self Care | Payer: Medicare Other | Source: Ambulatory Visit | Attending: Oncology | Admitting: Oncology

## 2018-10-05 DIAGNOSIS — Z880 Allergy status to penicillin: Secondary | ICD-10-CM | POA: Diagnosis not present

## 2018-10-05 DIAGNOSIS — I10 Essential (primary) hypertension: Secondary | ICD-10-CM | POA: Insufficient documentation

## 2018-10-05 DIAGNOSIS — E119 Type 2 diabetes mellitus without complications: Secondary | ICD-10-CM | POA: Insufficient documentation

## 2018-10-05 DIAGNOSIS — Z79899 Other long term (current) drug therapy: Secondary | ICD-10-CM | POA: Insufficient documentation

## 2018-10-05 DIAGNOSIS — R599 Enlarged lymph nodes, unspecified: Secondary | ICD-10-CM | POA: Insufficient documentation

## 2018-10-05 DIAGNOSIS — Z87891 Personal history of nicotine dependence: Secondary | ICD-10-CM | POA: Diagnosis not present

## 2018-10-05 DIAGNOSIS — E785 Hyperlipidemia, unspecified: Secondary | ICD-10-CM | POA: Insufficient documentation

## 2018-10-05 DIAGNOSIS — D72820 Lymphocytosis (symptomatic): Secondary | ICD-10-CM | POA: Diagnosis not present

## 2018-10-05 DIAGNOSIS — Z794 Long term (current) use of insulin: Secondary | ICD-10-CM | POA: Diagnosis not present

## 2018-10-05 LAB — CBC
HCT: 34.7 % — ABNORMAL LOW (ref 36.0–46.0)
Hemoglobin: 10.9 g/dL — ABNORMAL LOW (ref 12.0–15.0)
MCH: 28.2 pg (ref 26.0–34.0)
MCHC: 31.4 g/dL (ref 30.0–36.0)
MCV: 89.7 fL (ref 80.0–100.0)
NRBC: 0 % (ref 0.0–0.2)
PLATELETS: 248 10*3/uL (ref 150–400)
RBC: 3.87 MIL/uL (ref 3.87–5.11)
RDW: 16.7 % — AB (ref 11.5–15.5)
WBC: 6.8 10*3/uL (ref 4.0–10.5)

## 2018-10-05 LAB — GLUCOSE, CAPILLARY: GLUCOSE-CAPILLARY: 125 mg/dL — AB (ref 70–99)

## 2018-10-05 LAB — PROTIME-INR
INR: 0.94
Prothrombin Time: 12.5 seconds (ref 11.4–15.2)

## 2018-10-05 LAB — APTT: aPTT: 28 seconds (ref 24–36)

## 2018-10-05 MED ORDER — SODIUM CHLORIDE 0.9 % IV SOLN
INTRAVENOUS | Status: DC
  Administered 2018-10-05: 11:00:00 via INTRAVENOUS

## 2018-10-05 MED ORDER — FENTANYL CITRATE (PF) 100 MCG/2ML IJ SOLN
INTRAMUSCULAR | Status: AC
  Filled 2018-10-05: qty 4

## 2018-10-05 MED ORDER — MIDAZOLAM HCL 5 MG/5ML IJ SOLN
INTRAMUSCULAR | Status: AC
  Filled 2018-10-05: qty 5

## 2018-10-05 MED ORDER — MIDAZOLAM HCL 5 MG/5ML IJ SOLN
INTRAMUSCULAR | Status: AC | PRN
  Administered 2018-10-05 (×2): 1 mg via INTRAVENOUS

## 2018-10-05 MED ORDER — FENTANYL CITRATE (PF) 100 MCG/2ML IJ SOLN
INTRAMUSCULAR | Status: AC | PRN
  Administered 2018-10-05: 50 ug via INTRAVENOUS

## 2018-10-05 NOTE — Procedures (Signed)
Rt cervical adenopathy  S/p [place] CORE BX No comp Stable EBL 0 Path pending Full report in pacs

## 2018-10-05 NOTE — Discharge Instructions (Signed)
Needle Biopsy, Care After °Refer to this sheet in the next few weeks. These instructions provide you with information about caring for yourself after your procedure. Your health care provider may also give you more specific instructions. Your treatment has been planned according to current medical practices, but problems sometimes occur. Call your health care provider if you have any problems or questions after your procedure. °What can I expect after the procedure? °After your procedure, it is common to have soreness, bruising, or mild pain at the biopsy site. This should go away in a few days. °Follow these instructions at home: °· Rest as directed by your health care provider. °· Take medicines only as directed by your health care provider. °· There are many different ways to close and cover the biopsy site, including stitches (sutures), skin glue, and adhesive strips. Follow your health care provider's instructions about: °? Biopsy site care. °? Bandage (dressing) changes and removal. °? Biopsy site closure removal. °· Check your biopsy site every day for signs of infection. Watch for: °? Redness, swelling, or pain. °? Fluid, blood, or pus. °Contact a health care provider if: °· You have a fever. °· You have redness, swelling, or pain at the biopsy site that lasts longer than a few days. °· You have fluid, blood, or pus coming from the biopsy site. °· You feel nauseous. °· You vomit. °Get help right away if: °· You have shortness of breath. °· You have trouble breathing. °· You have chest pain. °· You feel dizzy or you faint. °· You have bleeding that does not stop with pressure or a bandage. °· You cough up blood. °· You have pain in your abdomen. °This information is not intended to replace advice given to you by your health care provider. Make sure you discuss any questions you have with your health care provider. °Document Released: 04/25/2015 Document Revised: 05/16/2016 Document Reviewed:  12/05/2014 °Elsevier Interactive Patient Education © 2018 [employer] ° °

## 2018-10-05 NOTE — H&P (Signed)
Chief Complaint: Adenopathy, lymphocytosis   Referring Physician(s): Yu,Zhou   History of Present Illness: [name] is a 67 y.o. female with unexplained  Mild adenopathy and lymphocytosis. Here for US node bx today. No complaints   Past Medical History:  Diagnosis Date  . Adult failure to thrive   . Asthma   . Cellulitis of left foot   . Cellulitis of right foot   . Diabetes mellitus without complication ([place])   . Glaucoma   . HLD (hyperlipidemia)   . Hypertension   . Protein calorie malnutrition ([place])   . Stage 2 skin ulcer of sacral region ([place])   . Thyroid disease    hypothyroid  . Urinary retention     Past Surgical History:  Procedure Laterality Date  . APPENDECTOMY      Allergies: Penicillins  Medications: Prior to Admission medications   Medication Sig Start Date End Date Taking? Authorizing Provider  acetaminophen (TYLENOL) 325 MG tablet Take 650 mg by mouth every 6 (six) hours as needed.   Yes [provider]  Cholecalciferol (D3-50) 50000 units capsule Take 50,000 Units by mouth once a week.   Yes [provider]  Cyanocobalamin (B-12) 1000 MCG CAPS Take 1,000 mcg by mouth daily. 08/04/18  Yes Earlie Server, MD  ferrous sulfate 325 (65 FE) MG tablet Take 325 mg by mouth daily with breakfast.   Yes [provider]  gabapentin (NEURONTIN) 100 MG capsule Take 200 mg by mouth 3 (three) times daily.   Yes [provider]  insulin aspart (NOVOLOG) 100 UNIT/ML injection Inject into the skin 3 (three) times daily before meals.   Yes [provider]  insulin NPH-regular Human (NOVOLIN 70/30) (70-30) 100 UNIT/ML injection Inject into the skin.   Yes [provider]  levothyroxine (SYNTHROID, LEVOTHROID) 125 MCG tablet Take 1 tablet (125 mcg total) by mouth daily before breakfast. 04/03/18  Yes Mody, Sital, MD  tamsulosin (FLOMAX) 0.4 MG CAPS capsule Take 1 capsule (0.4 mg total) by mouth daily. 04/03/18  Yes  Mody, Ulice Bold, MD  glipiZIDE (GLUCOTROL) 5 MG tablet Take by mouth daily before breakfast.    [provider]     Family History  Problem Relation Age of Onset  . Diabetes Mother   . Diabetes Father   . Alzheimer's disease Father   . Breast cancer Neg Hx     Social History   Socioeconomic History  . Marital status: Single    Spouse name: Not on file  . Number of children: Not on file  . Years of education: Not on file  . Highest education level: Not on file  Occupational History  . 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 Financial resource strain: Somewhat hard  . Food insecurity:    Worry: Never true    Inability: Never true  . Transportation needs:    Medical: No    Non-medical: No  Tobacco Use  . Smoking status: Former Research scientist (life sciences)  . Smokeless tobacco: Never Used  Substance and Sexual Activity  . Alcohol use: No  . Drug use: No  . Sexual activity: Not on file  Lifestyle  . Physical activity:    Days per week: 0 days    Minutes per session: Not on file  . Stress: To some extent  Relationships  . Social connections:    Talks on phone: More than three times a week    Gets together: Once  a week    Attends religious service: Never    Active member of club or organization: No    Attends meetings of clubs or organizations: Never    Relationship status: Never married  Other Topics Concern  . Not on file  Social History Narrative  . Not on file    ECOG Status: 0 - Asymptomatic  Review of Systems: A 12 point ROS discussed and pertinent positives are indicated in the HPI above.  All other systems are negative.  Review of Systems  Vital Signs: BP 130/75   Pulse 86   Temp 97.6 F (36.4 C)   Resp 17   Ht 5\' 4"  (1.626 m)   Wt 87.5 kg   LMP  (LMP Unknown)   SpO2 96%   BMI 33.13 kg/m   Physical Exam  Constitutional: She is oriented to person, place, and time. She appears well-developed and well-nourished. No distress.   Eyes: Conjunctivae are normal. No scleral icterus.  Cardiovascular: Normal rate and regular rhythm.  Pulmonary/Chest: Effort normal and breath sounds normal.  Abdominal: Soft. Bowel sounds are normal.  Neurological: She is alert and oriented to person, place, and time.  Skin: She is not diaphoretic.  Psychiatric: She has a normal mood and affect.    Imaging: Nm Pet Image Initial (pi) Skull Base To Thigh  Result Date: 09/07/2018 CLINICAL DATA:  Initial treatment strategy for lymphadenopathy. EXAM: NUCLEAR MEDICINE PET SKULL BASE TO THIGH TECHNIQUE: 9.8 mCi F-18 FDG was injected intravenously. Full-ring PET imaging was performed from the skull base to thigh after the radiotracer. CT data was obtained and used for attenuation correction and anatomic localization. Fasting blood glucose: 135 mg/dl COMPARISON:  CT abdomen 07/08/2018. FINDINGS: Mediastinal blood pool activity: SUV max 2.5 NECK: There is mild nonspecific hypermetabolism in the right level-II station, without a definite CT correlate. Otherwise, no hypermetabolic adenopathy in the neck. Incidental CT findings: None. CHEST: Hypermetabolic low right internal jugular lymph nodes measure up to 11 mm an SUV max of 3.7. Hypermetabolic mediastinal lymph nodes measure up 1.7 cm in the low left paratracheal station, with an SUV max of 4.8. No hypermetabolic hilar or axillary lymph nodes. Subcentimeter left upper lobe nodules are too small for PET resolution. Incidental CT findings: Atherosclerotic calcification of the arterial vasculature, including coronary arteries. Heart is at the upper limits of normal in size to mildly enlarged. No pericardial effusion. ABDOMEN/PELVIS: No abnormal hypermetabolism in the liver, adrenal glands, spleen or pancreas. No hypermetabolic lymph nodes. Incidental CT findings: Liver, gallbladder and adrenal glands are unremarkable. Horseshoe kidney with left hydronephrosis, as on 07/08/2017. Spleen, pancreas, stomach and bowel  are grossly unremarkable. Bladder wall thickening may be due to underdistention. SKELETON: No abnormal osseous hypermetabolism. Incidental CT findings: Degenerative changes in the spine, sacroiliac joints and hips, left greater than right. IMPRESSION: 1. Hypermetabolic right internal jugular and mediastinal adenopathy is worrisome for lymphoma. 2. Mild nonspecific hypermetabolism in the right level 2 station in the neck does not have a definite CT correlate. Continued attention on follow-up exams is warranted. 3. Subcentimeter left upper lobe nodules are too small PET resolution. 4. Aortic atherosclerosis (ICD10-170.0). Coronary artery calcification. 5. Horseshoe kidney with left-sided hydronephrosis, better seen and discussed on 07/08/2018. Electronically Signed   By: Lorin Picket M.D.   On: 09/07/2018 11:51    Labs:  CBC: Recent Labs    04/03/18 1602 07/21/18 1136 09/09/18 0931 10/05/18 1012  WBC 9.0 8.0 5.8 6.8  HGB 8.8* 10.9* 10.6* 10.9*  HCT 26.0* 33.2* 31.7* 34.7*  PLT 381 323 256 248    COAGS: Recent Labs    10/05/18 1012  INR 0.94  APTT 28    BMP: Recent Labs    03/31/18 0453 04/01/18 0906 07/08/18 1126 07/21/18 1136 09/09/18 0931  NA 134* 136  --  138 141  K 5.0 4.4  --  4.1 4.1  CL 109 113*  --  107 106  CO2 20* 20*  --  24 29  GLUCOSE 161* 164*  --  68* 183*  BUN 74* 40*  --  23 29*  CALCIUM 8.3* 7.9*  --  9.5 9.3  CREATININE 4.39* 1.25* 0.90 1.10* 1.08*  GFRNONAA 10* 44*  --  51* 52*  GFRAA 11* 51*  --  59* >60    LIVER FUNCTION TESTS: Recent Labs    07/21/18 1136 09/09/18 0931  BILITOT 0.4 0.4  AST 25 20  ALT 25 13  ALKPHOS 107 98  PROT 8.9* 8.4*  ALBUMIN 3.1* 3.3*    TUMOR MARKERS: No results for input(s): AFPTM, CEA, CA199, CHROMGRNA in the last 8760 hours.  Assessment and Plan:  Unexplained mild adenopathy with low level PET activity.  Plan for [place] bx of rt internal jugular of inguinal nodes  Risks and benefits discussed with the  patient including, but not limited to bleeding, infection, damage to adjacent structures or low yield requiring additional tests.  All of the patient's questions were answered, patient is agreeable to proceed. Consent signed and in chart.    Thank you for this interesting consult.  I greatly enjoyed meeting [name] and look forward to participating in their care.  A copy of this report was sent to the requesting provider on this date.  Electronically Signed: Greggory Keen, MD 10/05/2018, 11:22 AM   I spent a total of  15 Minutes   in face to face in clinical consultation, greater than 50% of which was counseling/coordinating care for this patient with unexplained adenopathy

## 2018-10-07 LAB — SURGICAL PATHOLOGY

## 2018-10-08 ENCOUNTER — Encounter: Payer: Self-pay | Admitting: Oncology

## 2018-10-09 ENCOUNTER — Ambulatory Visit: Payer: Medicare Other | Admitting: Oncology

## 2018-10-12 ENCOUNTER — Encounter: Payer: Self-pay | Admitting: Oncology

## 2018-10-12 ENCOUNTER — Other Ambulatory Visit: Payer: Self-pay

## 2018-10-12 ENCOUNTER — Inpatient Hospital Stay: Payer: Medicare Other

## 2018-10-12 ENCOUNTER — Inpatient Hospital Stay: Payer: Medicare Other | Attending: Oncology | Admitting: Oncology

## 2018-10-12 VITALS — BP 112/71 | HR 99 | Temp 97.4°F | Resp 16 | Ht 64.0 in | Wt 201.3 lb

## 2018-10-12 DIAGNOSIS — I700 Atherosclerosis of aorta: Secondary | ICD-10-CM | POA: Insufficient documentation

## 2018-10-12 DIAGNOSIS — Z87891 Personal history of nicotine dependence: Secondary | ICD-10-CM | POA: Insufficient documentation

## 2018-10-12 DIAGNOSIS — Q631 Lobulated, fused and horseshoe kidney: Secondary | ICD-10-CM | POA: Diagnosis not present

## 2018-10-12 DIAGNOSIS — R591 Generalized enlarged lymph nodes: Secondary | ICD-10-CM | POA: Diagnosis present

## 2018-10-12 DIAGNOSIS — R599 Enlarged lymph nodes, unspecified: Secondary | ICD-10-CM

## 2018-10-12 DIAGNOSIS — E538 Deficiency of other specified B group vitamins: Secondary | ICD-10-CM | POA: Insufficient documentation

## 2018-10-12 DIAGNOSIS — D869 Sarcoidosis, unspecified: Secondary | ICD-10-CM

## 2018-10-12 DIAGNOSIS — D638 Anemia in other chronic diseases classified elsewhere: Secondary | ICD-10-CM | POA: Insufficient documentation

## 2018-10-12 NOTE — Progress Notes (Signed)
Patient here for follow up. No complaints today. 

## 2018-10-13 LAB — ANGIOTENSIN CONVERTING ENZYME: Angiotensin-Converting Enzyme: 104 U/L — ABNORMAL HIGH (ref 14–82)

## 2018-10-14 NOTE — Progress Notes (Signed)
Hematology/Oncology Consult note [facility] Telephone:([phone] Fax:([phone]   Patient Care Team: Patient, No Pcp Per as PCP - General (General Practice) Rico Junker, RN as Registered Nurse Theodore Demark, RN as Registered Nurse  REFERRING PROVIDER: Urology Ellis Parents CHIEF COMPLAINTS/REASON FOR VISIT:  Evaluation of bilaterally inguinal lymphadenopathy.    HISTORY OF PRESENTING ILLNESS:  [name] is a  67 y.o.  female with PMH listed below who was referred to me for evaluation of inguinal lymphadenopathy.  Patient has chronic urinary retention, chronic Foley catheter placement follows up with urology.  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  Moderate to [place] left-sided hydronephrosis.  There is incidental finding of bilateral inguinal adenopathy etiology indeterminate.  She has chronic lower extremity swelling/cellulitis/ulcer secondary to chronic vein insufficiency. Patient denies any fever, unintentional weight loss, night sweating, fever chills.  Reporting feeling well except that" I want to the tube to come out". She lives in a facility.   she has followed up with urology and had Foley catheter taken out. She takes tamsulosin for urinary retention. # PET scan showed hypermetabolic right internal jugular and mediastinal adenopathy, mild nonspecific hypermetabolic some in the right level 2 station in the neck does not have a definitive CT correlate.  Subcentimeter left upper lobe nodule too small for PET resolution.  Aortic atherosclerosis.  CAD.  Horseshoe kidney with left side hydronephrosis.  INTERVAL HISTORY [name] is a 67 y.o. female who has above history reviewed by me today presents for follow-up visit for management of lymphadenopathy. During the interval patient underwent right cervical ultrasound-guided biopsy.  Patient presents to discuss pathology results. Denies any constitutional symptoms.   No new complaints   Review of Systems  Constitutional: Negative for chills, fever, malaise/fatigue and weight loss.  HENT: Negative for nosebleeds and sore throat.   Eyes: Negative for double vision, photophobia and redness.  Respiratory: Negative for cough, shortness of breath and wheezing.   Cardiovascular: Negative for chest pain, palpitations and orthopnea.  Gastrointestinal: Negative for abdominal pain, blood in stool, nausea and vomiting.  Genitourinary: Negative for dysuria.  Musculoskeletal: Negative for back pain, myalgias and neck pain.  Skin: Negative for itching and rash.       Chronic bilateral lower extremity wound  Neurological: Negative for dizziness, tingling and tremors.  Endo/Heme/Allergies: Negative for environmental allergies. Does not bruise/bleed easily.  Psychiatric/Behavioral: Negative for depression.    MEDICAL HISTORY:  Past Medical History:  Diagnosis Date  . Adult failure to thrive   . Asthma   . Cellulitis of left foot   . Cellulitis of right foot   . Diabetes mellitus without complication ([place])   . Glaucoma   . HLD (hyperlipidemia)   . Hypertension   . Protein calorie malnutrition ([place])   . Stage 2 skin ulcer of sacral region ([place])   . Thyroid disease    hypothyroid  . Urinary retention     SURGICAL HISTORY: Past Surgical History:  Procedure Laterality Date  . APPENDECTOMY      SOCIAL HISTORY: Social History   Socioeconomic History  . Marital status: Single    Spouse name: Not on file  . Number of children: Not on file  . Years of education: Not on file  . Highest education level: Not on file  Occupational History  . 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 Financial resource strain: Somewhat  hard  . Food insecurity:    Worry: Never true    Inability: Never true  . Transportation needs:    Medical: No    Non-medical: No  Tobacco Use  . Smoking status: Former Research scientist (life sciences)  . Smokeless tobacco:  Never Used  Substance and Sexual Activity  . Alcohol use: No  . Drug use: No  . Sexual activity: Not on file  Lifestyle  . Physical activity:    Days per week: 0 days    Minutes per session: Not on file  . Stress: To some extent  Relationships  . Social connections:    Talks on phone: More than three times a week    Gets together: Once a week    Attends religious service: Never    Active member of club or organization: No    Attends meetings of clubs or organizations: Never    Relationship status: Never married  . Intimate partner violence:    Fear of current or ex partner: No    Emotionally abused: No    Physically abused: No    Forced sexual activity: No  Other Topics Concern  . Not on file  Social History Narrative  . Not on file    FAMILY HISTORY: Family History  Problem Relation Age of Onset  . Diabetes Mother   . Diabetes Father   . Alzheimer's disease Father   . Breast cancer Neg Hx     ALLERGIES:  is allergic to penicillins.  MEDICATIONS:  Current Outpatient Medications  Medication Sig Dispense Refill  . acetaminophen (TYLENOL) 325 MG tablet Take 650 mg by mouth every 6 (six) hours as needed.    . Cholecalciferol (D3-50) 50000 units capsule Take 50,000 Units by mouth once a week.    . Cyanocobalamin (B-12) 1000 MCG CAPS Take 1,000 mcg by mouth daily. 90 capsule 1  . ferrous sulfate 325 (65 FE) MG tablet Take 325 mg by mouth daily with breakfast.    . gabapentin (NEURONTIN) 100 MG capsule Take 200 mg by mouth 3 (three) times daily.    Marland Kitchen glipiZIDE (GLUCOTROL) 5 MG tablet Take by mouth daily before breakfast.    . insulin aspart (NOVOLOG) 100 UNIT/ML injection Inject into the skin 3 (three) times daily before meals.    . insulin NPH-regular Human (NOVOLIN 70/30) (70-30) 100 UNIT/ML injection Inject into the skin.    Marland Kitchen levothyroxine (SYNTHROID, LEVOTHROID) 125 MCG tablet Take 1 tablet (125 mcg total) by mouth daily before breakfast. 30 tablet 0  . tamsulosin  (FLOMAX) 0.4 MG CAPS capsule Take 1 capsule (0.4 mg total) by mouth daily. 30 capsule 0   No current facility-administered medications for this visit.      PHYSICAL EXAMINATION: ECOG PERFORMANCE STATUS: 2 - Symptomatic, <50% confined to bed Vitals:   10/12/18 1328 10/12/18 1334  BP:  112/71  Pulse:  99  Resp: 16   Temp:  (!) 97.4 F (36.3 C)   Filed Weights   10/12/18 1328  Weight: 201 lb 4.8 oz (91.3 kg)    Physical Exam  Constitutional: She is oriented to person, place, and time. No distress.  HENT:  Head: Normocephalic and atraumatic.  Right Ear: External ear normal.  Left Ear: External ear normal.  Mouth/Throat: Oropharynx is clear and moist.  Eyes: Pupils are equal, round, and reactive to light. EOM are normal. No scleral icterus.  Neck: Normal range of motion. Neck supple.  Cardiovascular: Normal rate, regular rhythm and normal heart sounds.  Pulmonary/Chest: Effort  normal. No respiratory distress. She has no wheezes.  Abdominal: Soft. Bowel sounds are normal. She exhibits no distension and no mass. There is no tenderness.  Musculoskeletal: Normal range of motion. She exhibits no edema or deformity.  Chronic lower extremity swelling due to venous insufficiency.  Neurological: She is alert and oriented to person, place, and time. No cranial nerve deficit. Coordination normal.  Skin: Skin is warm and dry. No rash noted. No erythema.  Bilateral lower extremity wound wrapped with dressing.  Psychiatric: She has a normal mood and affect. Her behavior is normal. Thought content normal.     LABORATORY DATA:  I have reviewed the data as listed Lab Results  Component Value Date   WBC 6.8 10/05/2018   HGB 10.9 (L) 10/05/2018   HCT 34.7 (L) 10/05/2018   MCV 89.7 10/05/2018   PLT 248 10/05/2018   Recent Labs    04/01/18 0906 07/08/18 1126 07/21/18 1136 09/09/18 0931  NA 136  --  138 141  K 4.4  --  4.1 4.1  CL 113*  --  107 106  CO2 20*  --  24 29  GLUCOSE 164*   --  68* 183*  BUN 40*  --  23 29*  CREATININE 1.25* 0.90 1.10* 1.08*  CALCIUM 7.9*  --  9.5 9.3  GFRNONAA 44*  --  51* 52*  GFRAA 51*  --  59* >60  PROT  --   --  8.9* 8.4*  ALBUMIN  --   --  3.1* 3.3*  AST  --   --  25 20  ALT  --   --  25 13  ALKPHOS  --   --  107 98  BILITOT  --   --  0.4 0.4   Iron/TIBC/Ferritin/ %Sat    Component Value Date/Time   IRON 47 07/21/2018 1136   TIBC 221 (L) 07/21/2018 1136   FERRITIN 80 07/21/2018 1136   IRONPCTSAT 21 07/21/2018 1136     RADIOGRAPHIC STUDIES: I have personally reviewed the radiological images as listed and agreed with the findings in the report. CT hematuria work up IMPRESSION: 1. The patient has a horseshoe kidney. The left renal moiety is larger than the right. 2. The right renal moiety demonstrates no evidence of obstruction. 3. The left renal moiety demonstrates a dilated renal collecting system which partially washes out after Lasix. 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  09/07/2018 PET scan 1. Hypermetabolic right internal jugular and mediastinal adenopathy is worrisome for lymphoma. 2. Mild nonspecific hypermetabolism in the right level 2 station in the neck does not have a definite CT correlate. Continued attention on follow-up exams is warranted. 3. Subcentimeter left upper lobe nodules are too small PET resolution. 4. Aortic atherosclerosis (ICD10-170.0). Coronary artery calcification. 5. Horseshoe kidney with left-sided hydronephrosis, better seen and discussed on 07/08/2018.  ASSESSMENT & PLAN:  1. Enlarged lymph nodes   2. Sarcoid    #Pathology reports worse reviewed and discussed in detail with patient. Right cervical lymph node biopsy showed noncaseating epithelioid granuloma negative for malignancy. 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 She appears asymptomatic may not need any treatment.  She also has mediastinal lymphadenopathy  which most likely is the same process. I will refer patient to see pulmonology for further evaluation and management. Check ACE level   #Anemia due to chronic disease. # Low normal B12, repeat B12 level showed improvement.  Continue B12 supplements.  All questions were answered. The patient knows to call the clinic with any problems questions or concerns.  Return of visit:  As needed.  Total face to face encounter time for this patient visit was 15 min. >50% of the time was  spent in counseling and coordination of care.   Earlie Server, MD, PhD Hematology Oncology [facility] Pager- [phone] 10/14/2018

## 2018-10-26 ENCOUNTER — Encounter (INDEPENDENT_AMBULATORY_CARE_PROVIDER_SITE_OTHER): Payer: Self-pay | Admitting: Nurse Practitioner

## 2018-10-26 ENCOUNTER — Ambulatory Visit (INDEPENDENT_AMBULATORY_CARE_PROVIDER_SITE_OTHER): Payer: Medicare Other | Admitting: Nurse Practitioner

## 2018-10-26 VITALS — BP 135/75 | HR 92 | Resp 19 | Ht 64.0 in | Wt 203.0 lb

## 2018-10-26 DIAGNOSIS — E782 Mixed hyperlipidemia: Secondary | ICD-10-CM | POA: Diagnosis not present

## 2018-10-26 DIAGNOSIS — M25572 Pain in left ankle and joints of left foot: Secondary | ICD-10-CM | POA: Diagnosis not present

## 2018-10-26 DIAGNOSIS — I10 Essential (primary) hypertension: Secondary | ICD-10-CM | POA: Diagnosis not present

## 2018-10-26 DIAGNOSIS — I890 Lymphedema, not elsewhere classified: Secondary | ICD-10-CM | POA: Diagnosis not present

## 2018-10-26 NOTE — Progress Notes (Signed)
Subjective:    Patient ID: [name], female    DOB: 15-Dec-1951, 67 y.o.   MRN: [id number] Chief Complaint  Patient presents with  . Follow-up    1 month Lymph check    HPI  [name] is a 67 y.o. female is here for follow-up following Unna wrap placement.  The previous ulcerations that were there appeared to be healed, however the patient still is having a significant amount of edema despite use of her lymphedema pump.  She is also complaining of acute left ankle pain.  She states that the pain occurs more with ambulation however it is reproducible upon palpation.  The patient has numerous risk factors for atherosclerotic disease.  Patient denies any chest pain or shortness of breath.  She denies any claudication-like symptoms or rest pain.  She denies any fever, chills, nausea, vomiting, diarrhea.  Past Medical History:  Diagnosis Date  . Adult failure to thrive   . Asthma   . Cellulitis of left foot   . Cellulitis of right foot   . Diabetes mellitus without complication ([place])   . Glaucoma   . HLD (hyperlipidemia)   . Hypertension   . Protein calorie malnutrition ([place])   . Stage 2 skin ulcer of sacral region ([place])   . Thyroid disease    hypothyroid  . Urinary retention     Past Surgical History:  Procedure Laterality Date  . APPENDECTOMY      Social History   Socioeconomic History  . Marital status: Single    Spouse name: Not on file  . Number of children: Not on file  . Years of education: Not on file  . Highest education level: Not on file  Occupational History  . 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 Financial resource strain: Somewhat hard  . Food insecurity:    Worry: Never true    Inability: Never true  . Transportation needs:    Medical: No    Non-medical: No  Tobacco Use  . Smoking status: Former Research scientist (life sciences)  . Smokeless tobacco: Never Used  Substance and Sexual Activity  . Alcohol use: No  . Drug use:  No  . Sexual activity: Not on file  Lifestyle  . Physical activity:    Days per week: 0 days    Minutes per session: Not on file  . Stress: To some extent  Relationships  . Social connections:    Talks on phone: More than three times a week    Gets together: Once a week    Attends religious service: Never    Active member of club or organization: No    Attends meetings of clubs or organizations: Never    Relationship status: Never married  . Intimate partner violence:    Fear of current or ex partner: No    Emotionally abused: No    Physically abused: No    Forced sexual activity: No  Other Topics Concern  . Not on file  Social History Narrative  . Not on file    Family History  Problem Relation Age of Onset  . Diabetes Mother   . Diabetes Father   . Alzheimer's disease Father   . Breast cancer Neg Hx     Allergies  Allergen Reactions  . Penicillins Swelling    Has patient had a PCN reaction causing immediate rash, facial/tongue/throat swelling, SOB or lightheadedness with hypotension: Unknown Has patient had a  PCN reaction causing severe rash involving mucus membranes or skin necrosis: Unknown Has patient had a PCN reaction that required hospitalization: Unknown Has patient had a PCN reaction occurring within the last 10 years: Unknown If all of the above answers are "NO", then may proceed with Cephalosporin use.      Review of Systems   Review of Systems: Negative Unless Checked Constitutional: [] Weight loss  [] Fever  [] Chills Cardiac: [] Chest pain   []  Atrial Fibrillation  [] Palpitations   [] Shortness of breath when laying flat   [] Shortness of breath with exertion. Vascular:  [x] Pain in legs with walking   [] Pain in legs with standing  [] History of DVT   [] Phlebitis   [x] Swelling in legs   [] Varicose veins   [] Non-healing ulcers Pulmonary:   [] Uses home oxygen   [] Productive cough   [] Hemoptysis   [] Wheeze  [] COPD   [] Asthma Neurologic:  [] Dizziness    [] Seizures   [] History of stroke   [] History of TIA  [] Aphasia   [] Vissual changes   [] Weakness or numbness in arm   [x] Weakness or numbness in leg Musculoskeletal:   [] Joint swelling   [] Joint pain   [] Low back pain  []  History of Knee Replacement Hematologic:  [] Easy bruising  [] Easy bleeding   [] Hypercoagulable state   [] Anemic Gastrointestinal:  [] Diarrhea   [] Vomiting  [] Gastroesophageal reflux/heartburn   [] Difficulty swallowing. Genitourinary:  [] Chronic kidney disease   [] Difficult urination  [] Anuric   [] Blood in urine Skin:  [] Rashes   [] Ulcers  Psychological:  [] History of anxiety   []  History of major depression  []  Memory Difficulties     Objective:   Physical Exam  BP 135/75 (BP Location: Right Arm, Patient Position: Sitting)   Pulse 92   Resp 19   Ht 5\' 4"  (1.626 m)   Wt 203 lb (92.1 kg)   LMP  (LMP Unknown)   BMI 34.84 kg/m   Gen: WD/WN, NAD Head: [place]/AT, No temporalis wasting.  Ear/Nose/Throat: Hearing grossly intact, nares w/o erythema or drainage Eyes: PER, EOMI, sclera nonicteric.  Neck: Supple, no masses.  No JVD.  Pulmonary:  Good air movement, no use of accessory muscles.  Cardiac: RRR Vascular:  4+ edema on the left lower extremity.  Complains of pain in the left ankle area. Vessel Right Left  Radial Palpable Palpable  Dorsalis Pedis  hard to palpate Hard to palpate   Posterior Tibial Hard to palpate  Hard to palpate    Gastrointestinal: soft, non-distended. No guarding/no peritoneal signs.  Musculoskeletal: M/S 5/5 throughout.  No deformity or atrophy.  Neurologic: Pain and light touch intact in extremities.  Symmetrical.  Speech is fluent. Motor exam as listed above. Psychiatric: Judgment intact, Mood & affect appropriate for pt's clinical situation. Dermatologic: No Venous rashes. No Ulcers Noted.  No changes consistent with cellulitis. Lymph : No Cervical lymphadenopathy, no lichenification or skin changes of chronic lymphedema.      Assessment &  Plan:   1. Lymphedema  No surgery or intervention at this point in time.    I have reviewed my discussion with the patient regarding lymphedema and why it  causes symptoms.  Patient will continue wearing graduated compression stockings class 1 (20-30 mmHg) on a daily basis a prescription was given. The patient is reminded to put the stockings on first thing in the morning and removing them in the evening. The patient is instructed specifically not to sleep in the stockings.   In addition, behavioral modification throughout the day will  be continued.  This will include frequent elevation (such as in a recliner), use of over the counter pain medications as needed and exercise such as walking.  I have reviewed systemic causes for chronic edema such as liver, kidney and cardiac etiologies and there does not appear to be any significant changes in these organ systems over the past year.  The patient is under the impression that these organ systems are all stable and unchanged.    The patient will continue aggressive use of the  lymph pump.  This will continue to improve the edema control and prevent sequela such as ulcers and infections.     2. Acute left ankle pain The patient has acute left ankle pain.  Patient has multiple atherosclerotic risk factors for peripheral vascular disease.  We will obtain an ABI to ensure there is no arterial cause of the pain that she is having. - VAS [place] ABI WITH/WO TBI; Future  3. Mixed hyperlipidemia Continue statin as ordered and reviewed, no changes at this time   4. Essential hypertension Continue antihypertensive medications as already ordered, these medications have been reviewed and there are no changes at this time.    Current Outpatient Medications on File Prior to Visit  Medication Sig Dispense Refill  . acetaminophen (TYLENOL) 325 MG tablet Take 650 mg by mouth every 6 (six) hours as needed.    . Cholecalciferol (D3-50) 50000 units capsule Take  50,000 Units by mouth once a week.    . Cyanocobalamin (B-12) 1000 MCG CAPS Take 1,000 mcg by mouth daily. 90 capsule 1  . ferrous sulfate 325 (65 FE) MG tablet Take 325 mg by mouth daily with breakfast.    . gabapentin (NEURONTIN) 100 MG capsule Take 200 mg by mouth 3 (three) times daily.    Marland Kitchen glipiZIDE (GLUCOTROL) 5 MG tablet Take by mouth daily before breakfast.    . insulin aspart (NOVOLOG) 100 UNIT/ML injection Inject into the skin 3 (three) times daily before meals.    . insulin NPH-regular Human (NOVOLIN 70/30) (70-30) 100 UNIT/ML injection Inject into the skin.    Marland Kitchen levothyroxine (SYNTHROID, LEVOTHROID) 125 MCG tablet Take 1 tablet (125 mcg total) by mouth daily before breakfast. 30 tablet 0  . tamsulosin (FLOMAX) 0.4 MG CAPS capsule Take 1 capsule (0.4 mg total) by mouth daily. 30 capsule 0   No current facility-administered medications on file prior to visit.     There are no Patient Instructions on file for this visit. Return in about 4 weeks (around 11/23/2018).   Kris Hartmann, NP  This note was completed with Sales executive.  Any errors are purely unintentional.

## 2018-10-27 ENCOUNTER — Institutional Professional Consult (permissible substitution): Payer: Medicare Other | Admitting: Pulmonary Disease

## 2018-11-02 ENCOUNTER — Encounter (INDEPENDENT_AMBULATORY_CARE_PROVIDER_SITE_OTHER): Payer: Medicare Other

## 2018-11-09 ENCOUNTER — Encounter (INDEPENDENT_AMBULATORY_CARE_PROVIDER_SITE_OTHER): Payer: Medicare Other

## 2018-11-16 ENCOUNTER — Encounter (INDEPENDENT_AMBULATORY_CARE_PROVIDER_SITE_OTHER): Payer: Medicare Other

## 2018-11-23 ENCOUNTER — Ambulatory Visit (INDEPENDENT_AMBULATORY_CARE_PROVIDER_SITE_OTHER): Payer: Medicare Other

## 2018-11-23 ENCOUNTER — Ambulatory Visit (INDEPENDENT_AMBULATORY_CARE_PROVIDER_SITE_OTHER): Payer: Medicare Other | Admitting: Nurse Practitioner

## 2018-11-23 ENCOUNTER — Encounter (INDEPENDENT_AMBULATORY_CARE_PROVIDER_SITE_OTHER): Payer: Self-pay | Admitting: Nurse Practitioner

## 2018-11-23 VITALS — BP 125/70 | HR 85 | Resp 16 | Wt 206.4 lb

## 2018-11-23 DIAGNOSIS — I890 Lymphedema, not elsewhere classified: Secondary | ICD-10-CM

## 2018-11-23 DIAGNOSIS — E782 Mixed hyperlipidemia: Secondary | ICD-10-CM | POA: Diagnosis not present

## 2018-11-23 DIAGNOSIS — M25572 Pain in left ankle and joints of left foot: Secondary | ICD-10-CM | POA: Diagnosis not present

## 2018-11-23 MED ORDER — SILVER SULFADIAZINE 1 % EX CREA: 1.0000 "application " | TOPICAL_CREAM | Freq: Every day | CUTANEOUS | 0 refills | Status: DC

## 2018-11-23 NOTE — Progress Notes (Signed)
Subjective:    Patient ID: [name], female    DOB: 07/26/1951, 67 y.o.   MRN: [id number] Chief Complaint  Patient presents with  . Follow-up    38month abi    HPI  [name] is a 67 y.o. female following up for evaluation after placement.  The swelling in her left lower extremity is doing much better however the pain in her left ankle has not improved any.  There is a small separation near the area in her ankle.  It does not necessarily appear to be an open wound.  The area does not bleed or have any foul-smelling drainage.  Patient denies any fever, chills, nausea, vomiting or diarrhea.  Patient denies any chest pain or shortness of breath.  The patient underwent bilateral ABIs which reveal an ABI of 1.27 her right lower extremity and an ABI of 1.33 in her left.  There is also notation that the vessels are noncompressible, therefore it is likely causing some falsely elevated ABIs.  Her waveforms in her right lower extremity pain there biphasic in her anterior tibial artery and monophasic in her posterior tibial artery.  The tibial arteries of the left lower extremity are reported to be monophasic however the anterior tibial appears to be more biphasic.  Toe waveforms are strong bilaterally.  Past Medical History:  Diagnosis Date  . Adult failure to thrive   . Asthma   . Cellulitis of left foot   . Cellulitis of right foot   . Diabetes mellitus without complication ([place])   . Glaucoma   . HLD (hyperlipidemia)   . Hypertension   . Protein calorie malnutrition ([place])   . Stage 2 skin ulcer of sacral region ([place])   . Thyroid disease    hypothyroid  . Urinary retention     Past Surgical History:  Procedure Laterality Date  . APPENDECTOMY      Social History   Socioeconomic History  . Marital status: Single    Spouse name: Not on file  . Number of children: Not on file  . Years of education: Not on file  . Highest education level: Not on file  Occupational History   . 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 Financial resource strain: Somewhat hard  . Food insecurity:    Worry: Never true    Inability: Never true  . Transportation needs:    Medical: No    Non-medical: No  Tobacco Use  . Smoking status: Former Research scientist (life sciences)  . Smokeless tobacco: Never Used  Substance and Sexual Activity  . Alcohol use: No  . Drug use: No  . Sexual activity: Not on file  Lifestyle  . Physical activity:    Days per week: 0 days    Minutes per session: Not on file  . Stress: To some extent  Relationships  . Social connections:    Talks on phone: More than three times a week    Gets together: Once a week    Attends religious service: Never    Active member of club or organization: No    Attends meetings of clubs or organizations: Never    Relationship status: Never married  . Intimate partner violence:    Fear of current or ex partner: No    Emotionally abused: No    Physically abused: No    Forced sexual activity: No  Other Topics Concern  . Not on file  Social  History Narrative  . Not on file    Family History  Problem Relation Age of Onset  . Diabetes Mother   . Diabetes Father   . Alzheimer's disease Father   . Breast cancer Neg Hx     Allergies  Allergen Reactions  . Penicillins Swelling    Has patient had a PCN reaction causing immediate rash, facial/tongue/throat swelling, SOB or lightheadedness with hypotension: Unknown Has patient had a PCN reaction causing severe rash involving mucus membranes or skin necrosis: Unknown Has patient had a PCN reaction that required hospitalization: Unknown Has patient had a PCN reaction occurring within the last 10 years: Unknown If all of the above answers are "NO", then may proceed with Cephalosporin use.       Review of Systems: Negative Unless Checked Constitutional: [] Weight loss  [] Fever  [] Chills Cardiac: [] Chest pain   []  Atrial Fibrillation  [] Palpitations    [] Shortness of breath when laying flat   [] Shortness of breath with exertion. Vascular:  [x] Pain in legs with walking   [] Pain in legs with standing  [] History of DVT   [] Phlebitis   [x] Swelling in legs   [] Varicose veins   [] Non-healing ulcers Pulmonary:   [] Uses home oxygen   [] Productive cough   [] Hemoptysis   [] Wheeze  [] COPD   [] Asthma Neurologic:  [] Dizziness   [] Seizures   [] History of stroke   [] History of TIA  [] Aphasia   [] Vissual changes   [] Weakness or numbness in arm   [] Weakness or numbness in leg Musculoskeletal:   [] Joint swelling   [] Joint pain   [] Low back pain  []  History of Knee Replacement Hematologic:  [] Easy bruising  [] Easy bleeding   [] Hypercoagulable state   [] Anemic Gastrointestinal:  [] Diarrhea   [] Vomiting  [] Gastroesophageal reflux/heartburn   [] Difficulty swallowing. Genitourinary:  [] Chronic kidney disease   [] Difficult urination  [] Anuric   [] Blood in urine Skin:  [] Rashes   [] Ulcers  Psychological:  [] History of anxiety   []  History of major depression  []  Memory Difficulties     Objective:   Physical Exam  BP 125/70 (BP Location: Right Arm)   Pulse 85   Resp 16   Wt 206 lb 6.4 oz (93.6 kg)   LMP  (LMP Unknown)   BMI 35.43 kg/m   Gen: WD/WN, NAD Head: [place]/AT, No temporalis wasting.  Ear/Nose/Throat: Hearing grossly intact, nares w/o erythema or drainage Eyes: PER, EOMI, sclera nonicteric.  Neck: Supple, no masses.  No JVD.  Pulmonary:  Good air movement, no use of accessory muscles.  Cardiac: RRR Vascular:  3+ edema of lower extremities, unable to palpate pedal pulses due to body habitus Vessel Right Left  Radial Palpable Palpable   Gastrointestinal: soft, non-distended. No guarding/no peritoneal signs.  Musculoskeletal: M/S 5/5 throughout.  No deformity or atrophy.  Neurologic: Pain and light touch intact in extremities.  Symmetrical.  Speech is fluent. Motor exam as listed above. Psychiatric: Judgment intact, Mood & affect appropriate for pt's  clinical situation. Dermatologic: No Venous rashes. No Ulcers Noted.  No changes consistent with cellulitis. Lymph : Lichenification bilaterally      Assessment & Plan:   1. Acute left ankle pain Despite Unna wrap therapy the patient still has acute left ankle pain.  Her ABIs are somewhat suggestive of peripheral arterial disease however to better determine if there is an area of stenosis that may be the helped by angiogram we will do a left lower extremity arterial duplex with the patient returns in 1 month.  In further examining the left ankle there appears to be an opening in the skin, which is not quite a laceration but not quite an ulceration.  There is a deep crevice and an area on the ankle.  The wound is not open or foul smelling.  I have given her prescription to apply sulfa Silvadene daily to see if this also helps with healing of the crevice.  If the wound is not healed and the left lower extremity arterial duplex suggests that there is an area which may be helped the intervention we will speak with the patient regarding this as well as treatment options. - silver sulfADIAZINE (SILVADENE) 1 % cream; Apply 1 application topically daily.  Dispense: 50 g; Refill: 0 - VAS [place] LOWER EXTREMITY ARTERIAL DUPLEX; Future  2. Lymphedema Patient still has large lower extremities with swelling, however there is no venous ulceration.  The patient expressed interest in going back into the compression that she has been using at her facility for her bilateral lower extremities and that she is currently using on her right leg.  Stated that we can utilize these compression wraps, however if on her next visit her legs are grossly swollen or she has re-ulcerated we may have to return to the [employer] wraps.  Patient understands.  3. Mixed hyperlipidemia Continue statin as ordered and reviewed, no changes at this time    Current Outpatient Medications on File Prior to Visit  Medication Sig Dispense Refill    . acetaminophen (TYLENOL) 325 MG tablet Take 650 mg by mouth every 6 (six) hours as needed.    . Cholecalciferol (D3-50) 50000 units capsule Take 50,000 Units by mouth once a week.    . Cyanocobalamin (B-12) 1000 MCG CAPS Take 1,000 mcg by mouth daily. 90 capsule 1  . ferrous sulfate 325 (65 FE) MG tablet Take 325 mg by mouth daily with breakfast.    . gabapentin (NEURONTIN) 100 MG capsule Take 200 mg by mouth 3 (three) times daily.    Marland Kitchen glipiZIDE (GLUCOTROL) 5 MG tablet Take by mouth daily before breakfast.    . insulin aspart (NOVOLOG) 100 UNIT/ML injection Inject into the skin 3 (three) times daily before meals.    . insulin NPH-regular Human (NOVOLIN 70/30) (70-30) 100 UNIT/ML injection Inject into the skin.    Marland Kitchen levothyroxine (SYNTHROID, LEVOTHROID) 125 MCG tablet Take 1 tablet (125 mcg total) by mouth daily before breakfast. 30 tablet 0  . tamsulosin (FLOMAX) 0.4 MG CAPS capsule Take 1 capsule (0.4 mg total) by mouth daily. 30 capsule 0   No current facility-administered medications on file prior to visit.     There are no Patient Instructions on file for this visit. No follow-ups on file.   Kris Hartmann, NP  This note was completed with Sales executive.  Any errors are purely unintentional.

## 2018-12-28 ENCOUNTER — Ambulatory Visit (INDEPENDENT_AMBULATORY_CARE_PROVIDER_SITE_OTHER): Payer: Medicare Other

## 2018-12-28 ENCOUNTER — Encounter (INDEPENDENT_AMBULATORY_CARE_PROVIDER_SITE_OTHER): Payer: Self-pay | Admitting: Nurse Practitioner

## 2018-12-28 ENCOUNTER — Encounter (INDEPENDENT_AMBULATORY_CARE_PROVIDER_SITE_OTHER): Payer: Self-pay

## 2018-12-28 ENCOUNTER — Ambulatory Visit (INDEPENDENT_AMBULATORY_CARE_PROVIDER_SITE_OTHER): Payer: Medicare Other | Admitting: Nurse Practitioner

## 2018-12-28 VITALS — BP 134/72 | HR 89 | Resp 16 | Ht 64.0 in | Wt 215.0 lb

## 2018-12-28 DIAGNOSIS — M25572 Pain in left ankle and joints of left foot: Secondary | ICD-10-CM

## 2018-12-28 DIAGNOSIS — I890 Lymphedema, not elsewhere classified: Secondary | ICD-10-CM | POA: Diagnosis not present

## 2018-12-28 DIAGNOSIS — L97329 Non-pressure chronic ulcer of left ankle with unspecified severity: Secondary | ICD-10-CM | POA: Diagnosis not present

## 2018-12-28 DIAGNOSIS — I10 Essential (primary) hypertension: Secondary | ICD-10-CM | POA: Diagnosis not present

## 2018-12-28 DIAGNOSIS — I70243 Atherosclerosis of native arteries of left leg with ulceration of ankle: Secondary | ICD-10-CM

## 2019-01-03 ENCOUNTER — Encounter (INDEPENDENT_AMBULATORY_CARE_PROVIDER_SITE_OTHER): Payer: Self-pay | Admitting: Nurse Practitioner

## 2019-01-03 MED ORDER — CLINDAMYCIN PHOSPHATE 300 MG/50ML IV SOLN
300.0000 mg | Freq: Once | INTRAVENOUS | Status: AC
  Administered 2019-01-04: 300 mg via INTRAVENOUS

## 2019-01-03 NOTE — Progress Notes (Signed)
Subjective:    Patient ID: [name], female    DOB: 1951-12-14, 69 y.o.   MRN: [id number] Chief Complaint  Patient presents with  . Follow-up    HPI  [name] is a 68 y.o. female that presents today for follow-up evaluation in regards to a ulceration on her left lower extremity.  She noticed this pain in the ankle of her left lower extremity following Unna wrap therapy.  While there was not an obvious open wound when the deep indentations of her skin were spread it was hidden within that area.  The patient denies any causative factor.  The patient denies any fever, chills, nausea, vomiting or diarrhea.  The patient denies any claudication-like symptoms or rest pain.  The patient's lymphedema has been within control since she has been removed out of Unna boots.  The patient underwent ABIs on 11/23/2018 which revealed a left ABI of 1.33, with a TBI of 0.91.  Today the patient underwent a left lower extremity duplex study which revealed monophasic waveforms from the common femoral artery to the tibial arteries.  Past Medical History:  Diagnosis Date  . Adult failure to thrive   . Asthma   . Cellulitis of left foot   . Cellulitis of right foot   . Diabetes mellitus without complication ([place])   . Glaucoma   . HLD (hyperlipidemia)   . Hypertension   . Protein calorie malnutrition ([place])   . Stage 2 skin ulcer of sacral region ([place])   . Thyroid disease    hypothyroid  . Urinary retention     Past Surgical History:  Procedure Laterality Date  . APPENDECTOMY      Social History   Socioeconomic History  . Marital status: Single    Spouse name: Not on file  . Number of children: Not on file  . Years of education: Not on file  . Highest education level: Not on file  Occupational History  . 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 Financial resource strain: Somewhat hard  . Food insecurity:    Worry: Never true    Inability: Never  true  . Transportation needs:    Medical: No    Non-medical: No  Tobacco Use  . Smoking status: Former Research scientist (life sciences)  . Smokeless tobacco: Never Used  Substance and Sexual Activity  . Alcohol use: No  . Drug use: No  . Sexual activity: Not on file  Lifestyle  . Physical activity:    Days per week: 0 days    Minutes per session: Not on file  . Stress: To some extent  Relationships  . Social connections:    Talks on phone: More than three times a week    Gets together: Once a week    Attends religious service: Never    Active member of club or organization: No    Attends meetings of clubs or organizations: Never    Relationship status: Never married  . Intimate partner violence:    Fear of current or ex partner: No    Emotionally abused: No    Physically abused: No    Forced sexual activity: No  Other Topics Concern  . Not on file  Social History Narrative  . Not on file    Family History  Problem Relation Age of Onset  . Diabetes Mother   . Diabetes Father   . Alzheimer's disease Father   . Breast cancer  Neg Hx     Allergies  Allergen Reactions  . Penicillins Swelling    Has patient had a PCN reaction causing immediate rash, facial/tongue/throat swelling, SOB or lightheadedness with hypotension: Unknown Has patient had a PCN reaction causing severe rash involving mucus membranes or skin necrosis: Unknown Has patient had a PCN reaction that required hospitalization: Unknown Has patient had a PCN reaction occurring within the last 10 years: Unknown If all of the above answers are "NO", then may proceed with Cephalosporin use.      Review of Systems   Review of Systems: Negative Unless Checked Constitutional: [] Weight loss  [] Fever  [] Chills Cardiac: [] Chest pain   []  Atrial Fibrillation  [] Palpitations   [] Shortness of breath when laying flat   [] Shortness of breath with exertion. [] Shortness of breath at rest Vascular:  [] Pain in legs with walking   [] Pain in legs  with standing [] Pain in legs when laying flat   [] Claudication    [] Pain in feet when laying flat    [] History of DVT   [] Phlebitis   [x] Swelling in legs   [] Varicose veins   [x] Non-healing ulcers Pulmonary:   [] Uses home oxygen   [] Productive cough   [] Hemoptysis   [] Wheeze  [] COPD   [] Asthma Neurologic:  [] Dizziness   [] Seizures  [] Blackouts [] History of stroke   [] History of TIA  [] Aphasia   [] Temporary Blindness   [] Weakness or numbness in arm   [x] Weakness or numbness in leg Musculoskeletal:   [] Joint swelling   [] Joint pain   [] Low back pain  []  History of Knee Replacement [] Arthritis [] back Surgeries  []  Spinal Stenosis    Hematologic:  [] Easy bruising  [] Easy bleeding   [] Hypercoagulable state   [] Anemic Gastrointestinal:  [] Diarrhea   [] Vomiting  [] Gastroesophageal reflux/heartburn   [] Difficulty swallowing. [] Abdominal pain Genitourinary:  [] Chronic kidney disease   [] Difficult urination  [] Anuric   [] Blood in urine [] Frequent urination  [] Burning with urination   [] Hematuria Skin:  [] Rashes   [x] Ulcers [] Wounds Psychological:  [] History of anxiety   []  History of major depression  []  Memory Difficulties     Objective:   Physical Exam  BP 134/72 (BP Location: Right Arm, Patient Position: Sitting)   Pulse 89   Resp 16   Ht 5\' 4"  (1.626 m)   Wt 215 lb (97.5 kg)   LMP  (LMP Unknown)   BMI 36.90 kg/m   Gen: WD/WN, NAD Head: [place]/AT, No temporalis wasting.  Ear/Nose/Throat: Hearing grossly intact, nares w/o erythema or drainage Eyes: PER, EOMI, sclera nonicteric.  Neck: Supple, no masses.  No JVD.  Pulmonary:  Good air movement, no use of accessory muscles.  Cardiac: RRR Vascular: 3+ non pitting edema Vessel Right Left  Radial Palpable Palpable   Gastrointestinal: soft, non-distended. No guarding/no peritoneal signs.  Musculoskeletal: M/S 5/5 throughout.  No deformity or atrophy.  Neurologic: Pain and light touch intact in extremities.  Symmetrical.  Speech is fluent. Motor  exam as listed above. Psychiatric: Judgment intact, Mood & affect appropriate for pt's clinical situation. Dermatologic:  Hidden ulcer located within the skin folds of left ankle. No changes consistent with cellulitis. Lymph : No Cervical lymphadenopathy, dermal thickening bilaterally with lichenification      Assessment & Plan:   1. Essential hypertension Continue antihypertensive medications as already ordered, these medications have been reviewed and there are no changes at this time.   2. Lymphedema The patient swelling has been controlled with the use of compression wraps that she uses on  a daily basis.  The patient was reminded to put on her compression stockings in the morning and removing during the evening.  3. 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  This represents a limb threatening ischemia and places the patient at the risk for limb loss.  Patient should undergo angiography of the lower extremities with the hope for intervention for limb salvage.  The risks and benefits as well as the alternative therapies was discussed in detail with the patient.  All questions were answered.  Patient agrees to proceed with angiography.  The patient will follow up with me in the office after the procedure.    Current Outpatient Medications on File Prior to Visit  Medication Sig Dispense Refill  . acetaminophen (TYLENOL) 325 MG tablet Take 650 mg by mouth every 6 (six) hours as needed.    . Cholecalciferol (D3-50) 50000 units capsule Take 50,000 Units by mouth once a week.    . Cyanocobalamin (B-12) 1000 MCG CAPS Take 1,000 mcg by mouth daily. 90 capsule 1  . ferrous sulfate 325 (65 FE) MG tablet Take 325 mg by mouth daily with breakfast.    . gabapentin (NEURONTIN) 100 MG capsule Take 200  mg by mouth 3 (three) times daily.    Marland Kitchen glipiZIDE (GLUCOTROL) 5 MG tablet Take by mouth daily before breakfast.    . insulin aspart (NOVOLOG) 100 UNIT/ML injection Inject into the skin 3 (three) times daily before meals.    Marland Kitchen levothyroxine (SYNTHROID, LEVOTHROID) 125 MCG tablet Take 1 tablet (125 mcg total) by mouth daily before breakfast. 30 tablet 0  . silver sulfADIAZINE (SILVADENE) 1 % cream Apply 1 application topically daily. 50 g 0  . tamsulosin (FLOMAX) 0.4 MG CAPS capsule Take 1 capsule (0.4 mg total) by mouth daily. 30 capsule 0   No current facility-administered medications on file prior to visit.     There are no Patient Instructions on file for this visit. No follow-ups on file.   Kris Hartmann, NP  This note was completed with Sales executive.  Any errors are purely unintentional.

## 2019-01-04 ENCOUNTER — Ambulatory Visit
Admission: RE | Admit: 2019-01-04 | Discharge: 2019-01-04 | Disposition: A | Discharge status: Skilled Nursing Facility | Payer: Medicare Other | Attending: Vascular Surgery | Admitting: Vascular Surgery

## 2019-01-04 ENCOUNTER — Other Ambulatory Visit (INDEPENDENT_AMBULATORY_CARE_PROVIDER_SITE_OTHER): Payer: Self-pay | Admitting: Nurse Practitioner

## 2019-01-04 ENCOUNTER — Encounter: Payer: Self-pay | Admitting: *Deleted

## 2019-01-04 ENCOUNTER — Encounter
Admission: RE | Disposition: A | Discharge status: Skilled Nursing Facility | Payer: Self-pay | Source: Home / Self Care | Attending: Vascular Surgery

## 2019-01-04 DIAGNOSIS — I890 Lymphedema, not elsewhere classified: Secondary | ICD-10-CM | POA: Diagnosis not present

## 2019-01-04 DIAGNOSIS — I70243 Atherosclerosis of native arteries of left leg with ulceration of ankle: Secondary | ICD-10-CM | POA: Insufficient documentation

## 2019-01-04 DIAGNOSIS — L97919 Non-pressure chronic ulcer of unspecified part of right lower leg with unspecified severity: Secondary | ICD-10-CM | POA: Diagnosis not present

## 2019-01-04 DIAGNOSIS — L97909 Non-pressure chronic ulcer of unspecified part of unspecified lower leg with unspecified severity: Secondary | ICD-10-CM

## 2019-01-04 DIAGNOSIS — Z79899 Other long term (current) drug therapy: Secondary | ICD-10-CM | POA: Diagnosis not present

## 2019-01-04 DIAGNOSIS — I10 Essential (primary) hypertension: Secondary | ICD-10-CM | POA: Insufficient documentation

## 2019-01-04 DIAGNOSIS — L97329 Non-pressure chronic ulcer of left ankle with unspecified severity: Secondary | ICD-10-CM | POA: Insufficient documentation

## 2019-01-04 DIAGNOSIS — I70299 Other atherosclerosis of native arteries of extremities, unspecified extremity: Secondary | ICD-10-CM

## 2019-01-04 DIAGNOSIS — L97929 Non-pressure chronic ulcer of unspecified part of left lower leg with unspecified severity: Secondary | ICD-10-CM

## 2019-01-04 HISTORY — PX: LOWER EXTREMITY ANGIOGRAPHY: CATH118251

## 2019-01-04 LAB — BUN: BUN: 25 mg/dL — ABNORMAL HIGH (ref 8–23)

## 2019-01-04 LAB — CREATININE, SERUM
Creatinine, Ser: 1.02 mg/dL — ABNORMAL HIGH (ref 0.44–1.00)
GFR calc Af Amer: >60 mL/min (ref >60)
GFR calc non Af Amer: 57 mL/min — ABNORMAL LOW (ref >60)

## 2019-01-04 SURGERY — LOWER EXTREMITY ANGIOGRAPHY
Anesthesia: Moderate Sedation | Site: Leg Lower | Laterality: Left

## 2019-01-04 MED ORDER — HEPARIN SODIUM (PORCINE) 1000 UNIT/ML IJ SOLN
INTRAMUSCULAR | Status: AC
  Filled 2019-01-04: qty 1

## 2019-01-04 MED ORDER — CLINDAMYCIN PHOSPHATE 300 MG/50ML IV SOLN
INTRAVENOUS | Status: AC
  Filled 2019-01-04: qty 50

## 2019-01-04 MED ORDER — FENTANYL CITRATE (PF) 100 MCG/2ML IJ SOLN
INTRAMUSCULAR | Status: AC
  Filled 2019-01-04: qty 2

## 2019-01-04 MED ORDER — HEPARIN (PORCINE) IN NACL 1000-0.9 UT/500ML-% IV SOLN
INTRAVENOUS | Status: AC
  Filled 2019-01-04: qty 1000

## 2019-01-04 MED ORDER — MIDAZOLAM HCL 2 MG/2ML IJ SOLN
INTRAMUSCULAR | Status: DC | PRN
  Administered 2019-01-04: 1 mg via INTRAVENOUS
  Administered 2019-01-04: 2 mg via INTRAVENOUS

## 2019-01-04 MED ORDER — SODIUM CHLORIDE 0.9 % IV SOLN
INTRAVENOUS | Status: DC
  Administered 2019-01-04: 13:00:00 via INTRAVENOUS

## 2019-01-04 MED ORDER — HYDROMORPHONE HCL 1 MG/ML IJ SOLN: 1.0000 mg | Freq: Once | INTRAMUSCULAR | Status: DC | PRN

## 2019-01-04 MED ORDER — FENTANYL CITRATE (PF) 100 MCG/2ML IJ SOLN
INTRAMUSCULAR | Status: DC | PRN
  Administered 2019-01-04: 25 ug via INTRAVENOUS
  Administered 2019-01-04: 50 ug via INTRAVENOUS

## 2019-01-04 MED ORDER — MIDAZOLAM HCL 5 MG/5ML IJ SOLN
INTRAMUSCULAR | Status: AC
  Filled 2019-01-04: qty 5

## 2019-01-04 MED ORDER — ONDANSETRON HCL 4 MG/2ML IJ SOLN: 4.0000 mg | Freq: Four times a day (QID) | INTRAMUSCULAR | Status: DC | PRN

## 2019-01-04 MED ORDER — LIDOCAINE-EPINEPHRINE (PF) 1 %-1:200000 IJ SOLN
INTRAMUSCULAR | Status: AC
  Filled 2019-01-04: qty 10

## 2019-01-04 MED ORDER — DIPHENHYDRAMINE HCL 50 MG/ML IJ SOLN: 50.0000 mg | Freq: Once | INTRAMUSCULAR | Status: DC | PRN

## 2019-01-04 MED ORDER — SODIUM CHLORIDE 0.9 % IV BOLUS
250.0000 mL | Freq: Once | INTRAVENOUS | Status: AC
  Administered 2019-01-04: 250 mL via INTRAVENOUS

## 2019-01-04 SURGICAL SUPPLY — 7 items
CATH PIG 70CM (CATHETERS) ×3 IMPLANT
DEVICE STARCLOSE SE CLOSURE (Vascular Products) ×3 IMPLANT
PACK ANGIOGRAPHY (CUSTOM PROCEDURE TRAY) ×3 IMPLANT
SHEATH BRITE TIP 5FRX11 (SHEATH) ×3 IMPLANT
SYR MEDRAD MARK V 150ML (SYRINGE) ×3 IMPLANT
TUBING CONTRAST HIGH PRESS 72 (TUBING) ×3 IMPLANT
WIRE J 3MM .035X145CM (WIRE) ×3 IMPLANT

## 2019-01-04 NOTE — Discharge Instructions (Signed)

## 2019-01-04 NOTE — OR Nursing (Signed)
Pt ready for discharge at 1500 however waiting for Dr Lucky Cowboy for post procedure instructions

## 2019-01-04 NOTE — H&P (Signed)
[facility] History & Physical Update  The patient was interviewed and re-examined.  The patient's previous History and Physical has been reviewed and is unchanged.  There is no change in the plan of care. We plan to proceed with the scheduled procedure.  Leotis Pain, MD  01/04/2019, 1:02 PM

## 2019-01-04 NOTE — Op Note (Signed)
[facility]  Percutaneous Study/Intervention Procedural Note   Date of Surgery: 01/04/2019  Surgeon(s):Dajia Gunnels    Assistants:none  Pre-operative Diagnosis: Ulceration BLE  Post-operative diagnosis:  Same  Procedure(s) Performed:             1.  Ultrasound guidance for vascular access right femoral artery             2.  Catheter placement into left SFA from right femoral approach             3.  Aortogram and selective bilateral lower extremity angiograms             4.  StarClose closure device right femoral artery  EBL: minimal  Contrast: 45 cc  Fluoro Time: 1.0 minutes  Moderate Conscious Sedation Time: approximately 20 minutes using 3 mg of Versed and 75 mcg of Fentanyl              Indications:  Patient is a 68 y.o.female with non-healing ulcerations. The patient is brought in for angiography for further evaluation and potential treatment.  Due to the limb threatening nature of the situation, angiogram was performed for attempted limb salvage. The patient is aware that if the procedure fails, amputation would be expected.  The patient also understands that even with successful revascularization, amputation may still be required due to the severity of the situation. Risks and benefits are discussed and informed consent is obtained.   Procedure:  The patient was identified and appropriate procedural time out was performed.  The patient was then placed supine on the table and prepped and draped in the usual sterile fashion. Moderate conscious sedation was administered during a face to face encounter with the patient throughout the procedure with my supervision of the RN administering medicines and monitoring the patient's vital signs, pulse oximetry, telemetry and mental status throughout from the start of the procedure until the patient was taken to the recovery room. Ultrasound was used to evaluate the right common femoral artery.  It was patent .  A  digital ultrasound image was acquired.  A Seldinger needle was used to access the right common femoral artery under direct ultrasound guidance and a permanent image was performed.  A 0.035 J wire was advanced without resistance and a 5Fr sheath was placed.  Pigtail catheter was placed into the aorta and an AP aortogram was performed. This demonstrated normal renal arteries and normal aorta and iliac segments without significant stenosis. I then crossed the aortic bifurcation and advanced to the left femoral head and then down into the proximal to mid left SFA to opacify distally better. Selective left lower extremity angiogram was then performed. This demonstrated normal common femoral artery, profunda femoris artery, superficial femoral artery, popliteal artery, and a normal tibial trifurcation.  All 3 vessels were continuous distally without focal stenosis.  No arterial intervention would be required on the left lower extremity.  The diagnostic catheter was removed.  To evaluate the right lower extremity, imaging was performed to the right femoral sheath.  This demonstrated normal common femoral artery, profunda femoris artery, superficial femoral artery, popliteal artery, and again a normal tibial trifurcation.  All 3 vessels again appear continuous distally without focal stenosis. I elected to terminate the procedure. The sheath was removed and StarClose closure device was deployed in the right femoral artery with excellent hemostatic result. The patient was taken to the recovery room in stable condition having tolerated the procedure well.  Findings:  Aortogram:  normal renal arteries, normal aorta and iliac arteries without significant stenosis.             Left Lower Extremity:  normal common femoral artery, profunda femoris artery, superficial femoral artery, popliteal artery, and a normal tibial trifurcation.  All 3 vessels were continuous distally without focal stenosis  Right Lower  Extremity: normal common femoral artery, profunda femoris artery, superficial femoral artery, popliteal artery, and again a normal tibial trifurcation.  All 3 vessels again appear continuous distally without focal stenosis   Disposition: Patient was taken to the recovery room in stable condition having tolerated the procedure well.  Complications: None  Leotis Pain 01/04/2019 1:39 PM   This note was created with Dragon Medical transcription system. Any errors in dictation are purely unintentional.

## 2019-01-05 ENCOUNTER — Encounter: Payer: Self-pay | Admitting: Vascular Surgery

## 2019-01-06 ENCOUNTER — Encounter: Payer: Self-pay | Admitting: Nurse Practitioner

## 2019-01-06 ENCOUNTER — Non-Acute Institutional Stay: Payer: Medicare Other | Admitting: Nurse Practitioner

## 2019-01-06 VITALS — HR 82 | Resp 18 | Wt 200.0 lb

## 2019-01-06 DIAGNOSIS — R0602 Shortness of breath: Secondary | ICD-10-CM | POA: Insufficient documentation

## 2019-01-06 DIAGNOSIS — R600 Localized edema: Secondary | ICD-10-CM | POA: Insufficient documentation

## 2019-01-06 DIAGNOSIS — Z515 Encounter for palliative care: Secondary | ICD-10-CM | POA: Insufficient documentation

## 2019-01-06 NOTE — Progress Notes (Signed)
[facility] Telephone: [phone] Fax: [phone]  PATIENT NAME: [name] DOB: [birth year] MRN: [id number]  PRIMARY CARE PROVIDER:   Kirk Ruths, MD  REFERRING PROVIDER: Dr Jodelle Green of [place] RESPONSIBLE PARTY: Own RP    ASSESSMENT:     I visited and observed [name]. We talked about purpose for palliative care visit. She was an agreement as well as giving verbal consent, form completed. We talked about how she was feeling today. She talked about her procedure and was frustrated it didn't work on her left lower leg. She talked at length about the challenges involved in hopes that her appointment in February with vascular will be beneficial. We talked about progression of chronic disease. We talked about her functional level and continue to encourage her to walk with her walker. We talked about symptoms of pain, shortness of breath what she is not exhibiting. We talked about her appetite which is good. We talked about ongoing transition to assisted living facility from her independent apartment. She talked about the difficulties with loss of Independence though she does agree that it is what's best for her. We talked about medical goals of care. She verbalize she declined to talk about code status says she wants to continue to remain a full code. Medical goals remain with focus on aggressive interventions. We talked about role of palliative care and discuss will follow up in two months if needed or sooner should she declined. [name] in agreement and I updated staff. Therapeutic listening and emotional support provided. Questions answered to satisfaction.  RECOMMENDATIONS and PLAN:   1.Palliative care encounter Z51.5; Palliative medicine team will continue to support patient, patient's family, and medical team. Visit consisted of counseling and education dealing with the complex and emotionally intense issues of symptom management and palliative care  in the setting of serious and potentially life-threatening illness  2. Edema R60.9 secondary to chronic venous insufficiency , monitor weights, elevate  3. Dyspneic R06.00 remain stable at present time. Continue daily weights  I spent 60 minutes providing this consultation,  from 2:15pm to 3:15pm. More than 50% of the time in this consultation was spent coordinating communication.   HISTORY OF PRESENT ILLNESS:  [name] is a 68 y.o. year old female with multiple medical problems including Diabetes, chronic venous insufficiency, hyper lipidemia, lymphedema, hypothyroidism, adult failure to thrive, protein calorie malnutrition. She continues to reside at an assisted living facility [facility]. She is mobile with her walker and does transfer self. She does require some assistance with ADLs though is fairly independent. She does continue to have more difficulty with her left lower leg and increase in edema. She did have a procedure last week with Dr Lucky Cowboy although found to be unsuccessful. She has a follow-up appointment in February for further treatment options. No recent files, hospitalizations, infections. She is able to verbalize her needs. 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 She does remain a full code. At present she is sitting in [employer] in a chair. She appears comfortable. No visitors present. Palliative Care was asked to help address goals of care.   CODE STATUS: Full  PPS: 50% HOSPICE ELIGIBILITY/DIAGNOSIS: TBD  PAST MEDICAL HISTORY:  Past Medical History:  Diagnosis Date  . Adult failure to thrive   . Asthma   . Cellulitis of left foot   . Cellulitis of right foot   . Diabetes mellitus without complication ([place])   . Glaucoma   .  HLD (hyperlipidemia)   . Hypertension   . Protein calorie malnutrition ([place])   . Stage 2 skin ulcer of sacral region ([place])   . Thyroid disease    hypothyroid  . Urinary retention     SOCIAL HX:  Social History   Tobacco Use  .  Smoking status: Former Research scientist (life sciences)  . Smokeless tobacco: Never Used  Substance Use Topics  . Alcohol use: No    ALLERGIES:  Allergies  Allergen Reactions  . Penicillins Swelling    Has patient had a PCN reaction causing immediate rash, facial/tongue/throat swelling, SOB or lightheadedness with hypotension: Unknown Has patient had a PCN reaction causing severe rash involving mucus membranes or skin necrosis: Unknown Has patient had a PCN reaction that required hospitalization: Unknown Has patient had a PCN reaction occurring within the last 10 years: Unknown If all of the above answers are "NO", then may proceed with Cephalosporin use.      PERTINENT MEDICATIONS:  Outpatient Encounter Medications as of 01/06/2019  Medication Sig  . acetaminophen (TYLENOL) 325 MG tablet Take 650 mg by mouth every 6 (six) hours as needed.  . Cholecalciferol (D3-50) 50000 units capsule Take 50,000 Units by mouth once a week.  . Cyanocobalamin (B-12) 1000 MCG CAPS Take 1,000 mcg by mouth daily.  . ferrous sulfate 325 (65 FE) MG tablet Take 325 mg by mouth daily with breakfast.  . gabapentin (NEURONTIN) 100 MG capsule Take 200 mg by mouth 3 (three) times daily.  Marland Kitchen glipiZIDE (GLUCOTROL) 5 MG tablet Take by mouth daily before breakfast.  . insulin aspart (NOVOLOG) 100 UNIT/ML injection Inject into the skin 3 (three) times daily before meals.  Marland Kitchen levothyroxine (SYNTHROID, LEVOTHROID) 125 MCG tablet Take 1 tablet (125 mcg total) by mouth daily before breakfast.  . silver sulfADIAZINE (SILVADENE) 1 % cream Apply 1 application topically daily.  . tamsulosin (FLOMAX) 0.4 MG CAPS capsule Take 1 capsule (0.4 mg total) by mouth daily.   No facility-administered encounter medications on file as of 01/06/2019.     PHYSICAL EXAM:   General: NAD, chronically ill appearing pleasant female Cardiovascular: regular rate and rhythm Pulmonary: clear ant fields Abdomen: soft, nontender, + bowel sounds GU: no suprapubic  tenderness Extremities: no edema, no joint deformities Skin: no rashes Neurological: Weakness but otherwise nonfocal  Christin Ihor Gully, NP

## 2019-02-01 ENCOUNTER — Encounter (INDEPENDENT_AMBULATORY_CARE_PROVIDER_SITE_OTHER): Payer: Self-pay | Admitting: Nurse Practitioner

## 2019-02-01 ENCOUNTER — Other Ambulatory Visit: Payer: Self-pay

## 2019-02-01 ENCOUNTER — Ambulatory Visit (INDEPENDENT_AMBULATORY_CARE_PROVIDER_SITE_OTHER): Payer: Medicare Other | Admitting: Nurse Practitioner

## 2019-02-01 VITALS — BP 126/74 | HR 91 | Resp 16 | Ht 64.0 in | Wt 221.0 lb

## 2019-02-01 DIAGNOSIS — E119 Type 2 diabetes mellitus without complications: Secondary | ICD-10-CM

## 2019-02-01 DIAGNOSIS — I83029 Varicose veins of left lower extremity with ulcer of unspecified site: Secondary | ICD-10-CM

## 2019-02-01 DIAGNOSIS — Z794 Long term (current) use of insulin: Secondary | ICD-10-CM

## 2019-02-01 DIAGNOSIS — L97929 Non-pressure chronic ulcer of unspecified part of left lower leg with unspecified severity: Secondary | ICD-10-CM

## 2019-02-01 DIAGNOSIS — I890 Lymphedema, not elsewhere classified: Secondary | ICD-10-CM

## 2019-02-08 ENCOUNTER — Encounter (INDEPENDENT_AMBULATORY_CARE_PROVIDER_SITE_OTHER): Payer: Self-pay

## 2019-02-08 ENCOUNTER — Ambulatory Visit (INDEPENDENT_AMBULATORY_CARE_PROVIDER_SITE_OTHER): Payer: Medicare Other | Admitting: Vascular Surgery

## 2019-02-08 VITALS — BP 153/81 | HR 88 | Resp 16 | Ht 64.0 in | Wt 220.0 lb

## 2019-02-08 DIAGNOSIS — I890 Lymphedema, not elsewhere classified: Secondary | ICD-10-CM | POA: Diagnosis not present

## 2019-02-08 DIAGNOSIS — I872 Venous insufficiency (chronic) (peripheral): Secondary | ICD-10-CM | POA: Diagnosis not present

## 2019-02-08 NOTE — Progress Notes (Signed)
History of Present Illness  There is no documented history at this time  Assessments & Plan   There are no diagnoses linked to this encounter.    Additional instructions  Subjective:  Patient presents with venous ulcer of the Left lower extremity.    Procedure:  3 layer unna wrap was placed Left lower extremity.   Plan:   Follow up in one week.  

## 2019-02-09 NOTE — Progress Notes (Signed)
02/10/2019 2:57 PM   [name] 16-Apr-1951 [id number]  Referring provider: Kirk Ruths, MD [facility] [place], [name] [address]  Chief Complaint  Patient presents with  . Hydronephrosis    HPI: [name] is a 68 y.o. female African American female with uncontrolled DM, lymphadenitits, cellulitis and bilateral hydronephrosis who presents today for a six month follow up.  Background history  Patient is a 68 year old female with a history of uncontrolled diabetes, lymphadenitis and was recently admitted for cellulitis and was found to have bilateral hydronephrosis and urinary retention and Foley catheter was placed with her transportation specialist, Bennett.   Her Foley was placed on March 30, 2018 after a renal ultrasound was performed due to increasing serum creatinine levels.  PVR was > 1500cc.  RUS on 03/30/2018 noted mild right-sided and moderate left-sided hydronephrosis.  She was initiated on tamsulosin 0.4 mg daily. Foley catheter was exchanged on 06/23/2018.  She denies any difficulty with urination prior to her hospital admission.  Patient denies any gross hematuria, dysuria or suprapubic/flank pain.  Patient denies any fevers, chills, nausea or vomiting.     RUS on 06/23/2018 noted interval resolution of right-sided hydronephrosis. Persistent moderate left-sided hydronephrosis.  Partial distention of the urinary bladder with a Foley catheter present.  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  There is a moderate to marked left-sided hydronephrosis to the level of the UPJ. No stone or mass noted. 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  Right-sided pelvocaliectasis without evidence for right-sided obstructive uropathy.  Aortic Atherosclerosis.  Bilateral inguinal adenopathy. Etiology indeterminate.  Differential considerations include reactive adenopathy versus metastatic disease or lymphoproliferative  disorder.  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 The left renal moiety is larger than the right.  The right renal moiety demonstrates no evidence of obstruction.  The left renal moiety demonstrates a dilated renal collecting system which partially washes out after Lasix. 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  On her visit on 08/04/2018, she was without complaints.  She stated that she was voiding well.  Patient denied any gross hematuria, dysuria or suprapubic/flank pain.  Patient denied any fevers, chills, nausea or vomiting.  Her PVR was 106 mL.    Today (02/10/2019), the patient denies dysuria, suprapubic/flank/back pain, gross hematuria, and says she is doing fine.  Her PVR is 0 mL.  She says they did get the blood taken to check her kidney function done today.  She does say she's currently having nocturia 3 x.  She says she does not have sleep apnea, though she's not been tested.  She's been sleeping upright due to having problems rising from a prone position.  She does not like this, and is wanting to be sleeping again in a hospital bed as it was easier with her to get in and out of bed due to her problems moving her legs.  PMH: Past Medical History:  Diagnosis Date  . Adult failure to thrive   . Asthma   . Cellulitis of left foot   . Cellulitis of right foot   . Diabetes mellitus without complication ([place])   . Glaucoma   . HLD (hyperlipidemia)   . Hypertension   . Protein calorie malnutrition ([place])   . Stage 2 skin ulcer of sacral region ([place])   . Thyroid disease    hypothyroid  . Urinary retention  Surgical History: Past Surgical History:  Procedure Laterality Date  . APPENDECTOMY    . LOWER EXTREMITY ANGIOGRAPHY Left 01/04/2019   Procedure: LOWER EXTREMITY ANGIOGRAPHY;  Surgeon: Algernon Huxley, MD;  Location: [facility];  Service: Cardiovascular;  Laterality: Left;    Home Medications:  Allergies as of  02/10/2019      Reactions   Penicillins Swelling   Has patient had a PCN reaction causing immediate rash, facial/tongue/throat swelling, SOB or lightheadedness with hypotension: Unknown Has patient had a PCN reaction causing severe rash involving mucus membranes or skin necrosis: Unknown Has patient had a PCN reaction that required hospitalization: Unknown Has patient had a PCN reaction occurring within the last 10 years: Unknown If all of the above answers are "NO", then may proceed with Cephalosporin use.      Medication List       Accurate as of February 10, 2019  2:57 PM. Always use your most recent med list.        acetaminophen 325 MG tablet Commonly known as:  TYLENOL Take 650 mg by mouth every 6 (six) hours as needed.   B-12 1000 MCG Caps Take 1,000 mcg by mouth daily.   D3-50 1.25 MG (50000 UT) capsule Generic drug:  Cholecalciferol Take 50,000 Units by mouth once a week.   ferrous sulfate 325 (65 FE) MG tablet Take 325 mg by mouth daily with breakfast.   gabapentin 100 MG capsule Commonly known as:  NEURONTIN Take 200 mg by mouth 3 (three) times daily.   glipiZIDE 5 MG tablet Commonly known as:  GLUCOTROL Take by mouth daily before breakfast.   insulin aspart 100 UNIT/ML injection Commonly known as:  novoLOG Inject into the skin 3 (three) times daily before meals.   levothyroxine 125 MCG tablet Commonly known as:  SYNTHROID, LEVOTHROID Take 1 tablet (125 mcg total) by mouth daily before breakfast.   silver sulfADIAZINE 1 % cream Commonly known as:  SILVADENE Apply 1 application topically daily.   tamsulosin 0.4 MG Caps capsule Commonly known as:  FLOMAX Take 1 capsule (0.4 mg total) by mouth daily.       Allergies:  Allergies  Allergen Reactions  . Penicillins Swelling    Has patient had a PCN reaction causing immediate rash, facial/tongue/throat swelling, SOB or lightheadedness with hypotension: Unknown Has patient had a PCN reaction causing  severe rash involving mucus membranes or skin necrosis: Unknown Has patient had a PCN reaction that required hospitalization: Unknown Has patient had a PCN reaction occurring within the last 10 years: Unknown If all of the above answers are "NO", then may proceed with Cephalosporin use.     Family History: Family History  Problem Relation Age of Onset  . Diabetes Mother   . Diabetes Father   . Alzheimer's disease Father   . Breast cancer Neg Hx     Social History:  reports that she has quit smoking. She has never used smokeless tobacco. She reports that she does not drink alcohol or use drugs.  ROS: UROLOGY Frequent Urination?: No Hard to postpone urination?: No Burning/pain with urination?: No Get up at night to urinate?: Yes Leakage of urine?: No Urine stream starts and stops?: No Trouble starting stream?: No Do you have to strain to urinate?: No Blood in urine?: No Urinary tract infection?: No Sexually transmitted disease?: No Injury to kidneys or bladder?: No Painful intercourse?: No Weak stream?: No Currently pregnant?: No Vaginal bleeding?: No Last menstrual period?: n  Gastrointestinal Nausea?:  No Vomiting?: No Indigestion/heartburn?: No Diarrhea?: No Constipation?: No  Constitutional Fever: No Night sweats?: No Weight loss?: No Fatigue?: No  Skin Skin rash/lesions?: No Itching?: No  Eyes Blurred vision?: No Double vision?: No  Ears/Nose/Throat Sore throat?: No Sinus problems?: No  Hematologic/Lymphatic Swollen glands?: No Easy bruising?: No  Cardiovascular Leg swelling?: No Chest pain?: No  Respiratory Cough?: No Shortness of breath?: No  Endocrine Excessive thirst?: No  Musculoskeletal Back pain?: No Joint pain?: No  Neurological Headaches?: No Dizziness?: No  Psychologic Depression?: No Anxiety?: No  Physical Exam: BP (!) 148/78 (BP Location: Left Arm, Patient Position: Sitting, Cuff Size: Large)   Pulse 83   Ht 5'  4" (1.626 m)   Wt 221 lb (100.2 kg)   LMP  (LMP Unknown)   BMI 37.93 kg/m   Constitutional: Well nourished. Alert and oriented, No acute distress. Respiratory: Normal respiratory effort, no increased work of breathing. Skin: No rashes, bruises or suspicious lesions. Neurologic: Grossly intact, no focal deficits, moving all 4 extremities.  Psychiatric: Normal mood and affect.   Laboratory Data: Lab Results  Component Value Date   WBC 6.8 10/05/2018   HGB 10.9 (L) 10/05/2018   HCT 34.7 (L) 10/05/2018   MCV 89.7 10/05/2018   PLT 248 10/05/2018    Lab Results  Component Value Date   CREATININE 1.02 (H) 01/04/2019   Lab Results  Component Value Date   AST 20 09/09/2018   Lab Results  Component Value Date   ALT 13 09/09/2018   I have reviewed the labs.  Pertinent Imaging:  Results for orders placed or performed in visit on 02/10/19  Bladder Scan (Post Void Residual) in office  Result Value Ref Range   Scan Result 0    I have independently reviewed the films.  Assessment & Plan:    1. Horseshoe kidney - Intervention is not warranted at this time unless hydronephrosis becomes worse, patient becomes symptomatic or renal function worsens - RTC in 6 months for symptom recheck and repeat creatinine  2. Urinary retention - 0 mL PVR - Patient to return if unable to void or experiences suprapubic discomfort - RTC in 6 months for PVR   3. Inguinal adenopathy - Followed by Dr. Tasia Catchings at the cancer center  Return in about 6 months (around 08/11/2019) for BMP and symptom recheck .  Yolanda Council, PA-C  [facility] [address]  [place], [place] [address] [phone]  I, [name], am acting as a Education administrator for [employer], PA-C.   I have reviewed the above documentation for accuracy and completeness, and I agree with the above.    Yolanda Council, PA-C

## 2019-02-10 ENCOUNTER — Ambulatory Visit (INDEPENDENT_AMBULATORY_CARE_PROVIDER_SITE_OTHER): Payer: Medicare Other | Admitting: Urology

## 2019-02-10 ENCOUNTER — Encounter: Payer: Self-pay | Admitting: Urology

## 2019-02-10 ENCOUNTER — Ambulatory Visit: Payer: Medicare Other | Admitting: Urology

## 2019-02-10 VITALS — BP 148/78 | HR 83 | Ht 64.0 in | Wt 221.0 lb

## 2019-02-10 DIAGNOSIS — Q6211 Congenital occlusion of ureteropelvic junction: Secondary | ICD-10-CM | POA: Diagnosis not present

## 2019-02-10 DIAGNOSIS — I70243 Atherosclerosis of native arteries of left leg with ulceration of ankle: Secondary | ICD-10-CM

## 2019-02-10 LAB — BLADDER SCAN AMB NON-IMAGING: Scan Result: 0

## 2019-02-11 ENCOUNTER — Telehealth: Payer: Self-pay

## 2019-02-11 LAB — BASIC METABOLIC PANEL
BUN / CREAT RATIO: 29 — AB (ref 12–28)
BUN: 32 mg/dL — ABNORMAL HIGH (ref 8–27)
CO2: 20 mmol/L (ref 20–29)
Calcium: 9.2 mg/dL (ref 8.7–10.3)
Chloride: 106 mmol/L (ref 96–106)
Creatinine, Ser: 1.1 mg/dL — ABNORMAL HIGH (ref 0.57–1.00)
GFR calc non Af Amer: 52 mL/min/{1.73_m2} — ABNORMAL LOW (ref >59)
GFR, EST AFRICAN AMERICAN: 60 mL/min/{1.73_m2} (ref >59)
Glucose: 129 mg/dL — ABNORMAL HIGH (ref 65–99)
Potassium: 4.6 mmol/L (ref 3.5–5.2)
Sodium: 140 mmol/L (ref 134–144)

## 2019-02-11 NOTE — Telephone Encounter (Signed)
Called patient regarding lab results and spoke with Amber and relayed results.

## 2019-02-11 NOTE — Telephone Encounter (Signed)
-----   Message from Nori Riis, PA-C sent at 02/11/2019  7:52 AM EST ----- Please let Mrs. Rozeboom know that her renal function is stable.

## 2019-02-14 ENCOUNTER — Encounter (INDEPENDENT_AMBULATORY_CARE_PROVIDER_SITE_OTHER): Payer: Self-pay | Admitting: Nurse Practitioner

## 2019-02-14 NOTE — Progress Notes (Signed)
SUBJECTIVE:  Patient ID: [name], female    DOB: [birth year], 68 y.o.   MRN: [id number] Chief Complaint  Patient presents with  . Follow-up    HPI  [name] is a 68 y.o. female that presents for follow-up of her lymphedema as well as post angiogram.  Patient denies any claudication or rest pain like symptoms.  The wound on her left ankle still continues to cause her some pain and discomfort.  Her lymphedema and swelling appears much less under control on her left lower extremity.  She denies any fever, chills, nausea, vomiting or diarrhea.  She denies any chest pain or shortness of breath.  She denies any TIA-like symptoms.  Past Medical History:  Diagnosis Date  . Adult failure to thrive   . Asthma   . Cellulitis of left foot   . Cellulitis of right foot   . Diabetes mellitus without complication ([place])   . Glaucoma   . HLD (hyperlipidemia)   . Hypertension   . Protein calorie malnutrition ([place])   . 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 Thyroid disease    hypothyroid  . Urinary retention     Past Surgical History:  Procedure Laterality Date  . APPENDECTOMY    . LOWER EXTREMITY ANGIOGRAPHY Left 01/04/2019   Procedure: LOWER EXTREMITY ANGIOGRAPHY;  Surgeon: Algernon Huxley, MD;  Location: [facility];  Service: Cardiovascular;  Laterality: Left;    Social History   Socioeconomic History  . Marital status: Single    Spouse name: Not on file  . Number of children: Not on file  . Years of education: Not on file  . Highest education level: Not on file  Occupational History  . 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 Financial resource strain: Somewhat hard  . Food insecurity:    Worry: Never true    Inability: Never true  . Transportation needs:    Medical: No    Non-medical: No  Tobacco Use  . Smoking status: Former Research scientist (life sciences)  . Smokeless tobacco: Never Used  Substance and Sexual Activity  . Alcohol use:  No  . Drug use: No  . Sexual activity: Not on file  Lifestyle  . Physical activity:    Days per week: 0 days    Minutes per session: Not on file  . Stress: To some extent  Relationships  . Social connections:    Talks on phone: More than three times a week    Gets together: Once a week    Attends religious service: Never    Active member of club or organization: No    Attends meetings of clubs or organizations: Never    Relationship status: Never married  . Intimate partner violence:    Fear of current or ex partner: No    Emotionally abused: No    Physically abused: No    Forced sexual activity: No  Other Topics Concern  . Not on file  Social History Narrative  . Not on file    Family History  Problem Relation Age of Onset  . Diabetes Mother   . Diabetes Father   . Alzheimer's disease Father   . Breast cancer Neg Hx     Allergies  Allergen Reactions  . Penicillins Swelling    Has patient had a PCN reaction causing immediate rash, facial/tongue/throat swelling, SOB or lightheadedness with hypotension: Unknown Has patient had  a PCN reaction causing severe rash involving mucus membranes or skin necrosis: Unknown Has patient had a PCN reaction that required hospitalization: Unknown Has patient had a PCN reaction occurring within the last 10 years: Unknown If all of the above answers are "NO", then may proceed with Cephalosporin use.      Review of Systems   Review of Systems: Negative Unless Checked Constitutional: [] Weight loss  [] Fever  [] Chills Cardiac: [] Chest pain   []  Atrial Fibrillation  [] Palpitations   [] Shortness of breath when laying flat   [] Shortness of breath with exertion. [] Shortness of breath at rest Vascular:  [] Pain in legs with walking   [] Pain in legs with standing [] Pain in legs when laying flat   [] Claudication    [] Pain in feet when laying flat    [] History of DVT   [] Phlebitis   [x] Swelling in legs   [] Varicose veins   [] Non-healing  ulcers Pulmonary:   [] Uses home oxygen   [] Productive cough   [] Hemoptysis   [] Wheeze  [] COPD   [] Asthma Neurologic:  [] Dizziness   [] Seizures  [] Blackouts [] History of stroke   [] History of TIA  [] Aphasia   [] Temporary Blindness   [] Weakness or numbness in arm   [x] Weakness or numbness in leg Musculoskeletal:   [] Joint swelling   [] Joint pain   [] Low back pain  []  History of Knee Replacement [] Arthritis [] back Surgeries  []  Spinal Stenosis    Hematologic:  [] Easy bruising  [] Easy bleeding   [] Hypercoagulable state   [] Anemic Gastrointestinal:  [] Diarrhea   [] Vomiting  [] Gastroesophageal reflux/heartburn   [] Difficulty swallowing. [] Abdominal pain Genitourinary:  [] Chronic kidney disease   [] Difficult urination  [] Anuric   [] Blood in urine [] Frequent urination  [] Burning with urination   [] Hematuria Skin:  [] Rashes   [] Ulcers [x] Wounds Psychological:  [] History of anxiety   []  History of major depression  []  Memory Difficulties      OBJECTIVE:   Physical Exam  BP 126/74 (BP Location: Left Arm, Patient Position: Sitting, Cuff Size: Large)   Pulse 91   Resp 16   Ht 5\' 4"  (1.626 m)   Wt 221 lb (100.2 kg)   LMP  (LMP Unknown)   BMI 37.93 kg/m   Gen: WD/WN, NAD Head: [place]/AT, No temporalis wasting.  Ear/Nose/Throat: Hearing grossly intact, nares w/o erythema or drainage Eyes: PER, EOMI, sclera nonicteric.  Neck: Supple, no masses.  No JVD.  Pulmonary:  Good air movement, no use of accessory muscles.  Cardiac: RRR Vascular:  4+ edema on left lower extremity Vessel Right Left  Radial Palpable Palpable   Gastrointestinal: soft, non-distended. No guarding/no peritoneal signs.  Musculoskeletal: M/S 5/5 throughout.  No deformity or atrophy.  Neurologic: Pain and light touch intact in extremities.  Symmetrical.  Speech is fluent. Motor exam as listed above. Psychiatric: Judgment intact, Mood & affect appropriate for pt's clinical situation. Dermatologic: No Venous rashes. No Ulcers Noted.   No changes consistent with cellulitis. Lymph : No Cervical lymphadenopathy, no lichenification or skin changes of chronic lymphedema.       ASSESSMENT AND PLAN:  1. Lymphedema We will place the patient in wraps for her left lower extremity to help control the edema.  She will continue wearing medical grade 1 compression on her right lower extremity.  She will have her own wraps changed weekly drainage permitting and will follow-up 4 weeks to evaluate the status of the swelling.   2. Venous ulcer of left leg (HCC) No surgery or intervention at this point in  time.    I have had a long discussion with the patient regarding venous insufficiency and why it  causes symptoms, specifically venous ulceration . I have discussed with the patient the chronic skin changes that accompany venous insufficiency and the long term sequela such as infection and recurring  ulceration.  Patient will be placed in [employer] which will be changed weekly drainage permitting.  In addition, behavioral modification including several periods of elevation of the lower extremities during the day will be continued. Achieving a position with the ankles at heart level was stressed to the patient  The patient is instructed to begin routine exercise, especially walking on a daily basis  The patient can be assessed for graduated compression stockings or wraps as well as a Lymph Pump once the ulcers are healed.   3. Type 2 diabetes mellitus with insulin therapy ([place]) Continue hypoglycemic medications as already ordered, these medications have been reviewed and there are no changes at this time.  Hgb A1C to be monitored as already arranged by primary service    Current Outpatient Medications on File Prior to Visit  Medication Sig Dispense Refill  . Cholecalciferol (D3-50) 50000 units capsule Take 50,000 Units by mouth once a week.    . Cyanocobalamin (B-12) 1000 MCG CAPS Take 1,000 mcg by mouth daily. 90 capsule 1  .  ferrous sulfate 325 (65 FE) MG tablet Take 325 mg by mouth daily with breakfast.    . gabapentin (NEURONTIN) 100 MG capsule Take 200 mg by mouth 3 (three) times daily.    Marland Kitchen glipiZIDE (GLUCOTROL) 5 MG tablet Take by mouth daily before breakfast.    . insulin aspart (NOVOLOG) 100 UNIT/ML injection Inject into the skin 3 (three) times daily before meals.    Marland Kitchen levothyroxine (SYNTHROID, LEVOTHROID) 125 MCG tablet Take 1 tablet (125 mcg total) by mouth daily before breakfast. 30 tablet 0  . silver sulfADIAZINE (SILVADENE) 1 % cream Apply 1 application topically daily. 50 g 0  . tamsulosin (FLOMAX) 0.4 MG CAPS capsule Take 1 capsule (0.4 mg total) by mouth daily. 30 capsule 0  . acetaminophen (TYLENOL) 325 MG tablet Take 650 mg by mouth every 6 (six) hours as needed.     No current facility-administered medications on file prior to visit.     There are no Patient Instructions on file for this visit. No follow-ups on file.   Kris Hartmann, NP  This note was completed with Sales executive.  Any errors are purely unintentional.

## 2019-02-15 ENCOUNTER — Encounter (INDEPENDENT_AMBULATORY_CARE_PROVIDER_SITE_OTHER): Payer: Self-pay | Admitting: Nurse Practitioner

## 2019-02-15 ENCOUNTER — Ambulatory Visit (INDEPENDENT_AMBULATORY_CARE_PROVIDER_SITE_OTHER): Payer: Medicare Other | Admitting: Nurse Practitioner

## 2019-02-15 VITALS — BP 147/80 | HR 92 | Resp 16 | Ht 64.0 in | Wt 219.6 lb

## 2019-02-15 DIAGNOSIS — I83029 Varicose veins of left lower extremity with ulcer of unspecified site: Secondary | ICD-10-CM | POA: Diagnosis not present

## 2019-02-15 DIAGNOSIS — L97929 Non-pressure chronic ulcer of unspecified part of left lower leg with unspecified severity: Secondary | ICD-10-CM

## 2019-02-15 NOTE — Progress Notes (Signed)
History of Present Illness  There is no documented history at this time  Assessments & Plan   There are no diagnoses linked to this encounter.    Additional instructions  Subjective:  Patient presents with venous ulcer of the Left lower extremity.    Procedure:  3 layer unna wrap was placed Left lower extremity.   Plan:   Follow up in one week.  

## 2019-02-22 ENCOUNTER — Other Ambulatory Visit: Payer: Self-pay

## 2019-02-22 ENCOUNTER — Ambulatory Visit (INDEPENDENT_AMBULATORY_CARE_PROVIDER_SITE_OTHER): Payer: Medicare Other | Admitting: Nurse Practitioner

## 2019-02-22 ENCOUNTER — Encounter (INDEPENDENT_AMBULATORY_CARE_PROVIDER_SITE_OTHER): Payer: Self-pay | Admitting: Nurse Practitioner

## 2019-02-22 VITALS — BP 114/71 | HR 80 | Resp 12 | Ht 64.0 in | Wt 218.0 lb

## 2019-02-22 DIAGNOSIS — Z79899 Other long term (current) drug therapy: Secondary | ICD-10-CM

## 2019-02-22 DIAGNOSIS — I83029 Varicose veins of left lower extremity with ulcer of unspecified site: Secondary | ICD-10-CM

## 2019-02-22 DIAGNOSIS — Z87891 Personal history of nicotine dependence: Secondary | ICD-10-CM

## 2019-02-22 DIAGNOSIS — I10 Essential (primary) hypertension: Secondary | ICD-10-CM | POA: Diagnosis not present

## 2019-02-22 DIAGNOSIS — I890 Lymphedema, not elsewhere classified: Secondary | ICD-10-CM | POA: Diagnosis not present

## 2019-02-22 DIAGNOSIS — L97929 Non-pressure chronic ulcer of unspecified part of left lower leg with unspecified severity: Secondary | ICD-10-CM

## 2019-02-22 MED ORDER — NYSTATIN 100000 UNIT/GM EX OINT: 1.0000 "application " | TOPICAL_OINTMENT | Freq: Two times a day (BID) | CUTANEOUS | 5 refills | Status: DC

## 2019-02-23 ENCOUNTER — Encounter (INDEPENDENT_AMBULATORY_CARE_PROVIDER_SITE_OTHER): Payer: Self-pay | Admitting: Nurse Practitioner

## 2019-02-23 NOTE — Progress Notes (Signed)
SUBJECTIVE:  Patient ID: [name], female    DOB: 09-26-1951, 68 y.o.   MRN: [id number] Chief Complaint  Patient presents with  . Follow-up    HPI  [name] is a 68 y.o. female that presents today for evaluation of swelling after Unna wrap therapy.  Her swelling is much more controlled on her left lower extremity.  The patient still continues to complain of pain from a left lower extremity wound.  This wound is located in her ankle and appears to be like a crack.  The patient states that it is somewhat sore but feels somewhat better with compression wraps.  The patient denies any fever, chills, nausea, vomiting or diarrhea.  She denies any chest pain or shortness of breath.  She denies any TIA-like symptoms.  Past Medical History:  Diagnosis Date  . Adult failure to thrive   . Asthma   . Cellulitis of left foot   . Cellulitis of right foot   . Diabetes mellitus without complication ([place])   . Glaucoma   . HLD (hyperlipidemia)   . Hypertension   . Protein calorie malnutrition ([place])   . 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 Thyroid disease    hypothyroid  . Urinary retention     Past Surgical History:  Procedure Laterality Date  . APPENDECTOMY    . LOWER EXTREMITY ANGIOGRAPHY Left 01/04/2019   Procedure: LOWER EXTREMITY ANGIOGRAPHY;  Surgeon: Algernon Huxley, MD;  Location: [facility];  Service: Cardiovascular;  Laterality: Left;    Social History   Socioeconomic History  . Marital status: Single    Spouse name: Not on file  . Number of children: Not on file  . Years of education: Not on file  . Highest education level: Not on file  Occupational History  . 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 Financial resource strain: Somewhat hard  . Food insecurity:    Worry: Never true    Inability: Never true  . Transportation needs:    Medical: No    Non-medical: No  Tobacco Use  . Smoking status: Former  Research scientist (life sciences)  . Smokeless tobacco: Never Used  Substance and Sexual Activity  . Alcohol use: No  . Drug use: No  . Sexual activity: Not on file  Lifestyle  . Physical activity:    Days per week: 0 days    Minutes per session: Not on file  . Stress: To some extent  Relationships  . Social connections:    Talks on phone: More than three times a week    Gets together: Once a week    Attends religious service: Never    Active member of club or organization: No    Attends meetings of clubs or organizations: Never    Relationship status: Never married  . Intimate partner violence:    Fear of current or ex partner: No    Emotionally abused: No    Physically abused: No    Forced sexual activity: No  Other Topics Concern  . Not on file  Social History Narrative  . Not on file    Family History  Problem Relation Age of Onset  . Diabetes Mother   . Diabetes Father   . Alzheimer's disease Father   . Breast cancer Neg Hx     Allergies  Allergen Reactions  . Penicillins Swelling    Has patient had  a PCN reaction causing immediate rash, facial/tongue/throat swelling, SOB or lightheadedness with hypotension: Unknown Has patient had a PCN reaction causing severe rash involving mucus membranes or skin necrosis: Unknown Has patient had a PCN reaction that required hospitalization: Unknown Has patient had a PCN reaction occurring within the last 10 years: Unknown If all of the above answers are "NO", then may proceed with Cephalosporin use.      Review of Systems   Review of Systems: Negative Unless Checked Constitutional: [] Weight loss  [] Fever  [] Chills Cardiac: [] Chest pain   []  Atrial Fibrillation  [] Palpitations   [] Shortness of breath when laying flat   [] Shortness of breath with exertion. [] Shortness of breath at rest Vascular:  [] Pain in legs with walking   [] Pain in legs with standing [] Pain in legs when laying flat   [] Claudication    [] Pain in feet when laying flat     [] History of DVT   [] Phlebitis   [x] Swelling in legs   [] Varicose veins   [] Non-healing ulcers Pulmonary:   [] Uses home oxygen   [] Productive cough   [] Hemoptysis   [] Wheeze  [] COPD   [] Asthma Neurologic:  [] Dizziness   [] Seizures  [] Blackouts [] History of stroke   [] History of TIA  [] Aphasia   [] Temporary Blindness   [] Weakness or numbness in arm   [] Weakness or numbness in leg Musculoskeletal:   [] Joint swelling   [] Joint pain   [] Low back pain  []  History of Knee Replacement [] Arthritis [] back Surgeries  []  Spinal Stenosis    Hematologic:  [] Easy bruising  [] Easy bleeding   [] Hypercoagulable state   [] Anemic Gastrointestinal:  [] Diarrhea   [] Vomiting  [] Gastroesophageal reflux/heartburn   [] Difficulty swallowing. [] Abdominal pain Genitourinary:  [] Chronic kidney disease   [] Difficult urination  [] Anuric   [] Blood in urine [] Frequent urination  [] Burning with urination   [] Hematuria Skin:  [] Rashes   [] Ulcers [x] Wounds Psychological:  [] History of anxiety   []  History of major depression  []  Memory Difficulties      OBJECTIVE:   Physical Exam  BP 114/71 (BP Location: Left Arm, Patient Position: Sitting, Cuff Size: Large)   Pulse 80   Resp 12   Ht 5\' 4"  (1.626 m)   Wt 218 lb (98.9 kg)   LMP  (LMP Unknown)   BMI 37.42 kg/m   Gen: WD/WN, NAD Head: [place]/AT, No temporalis wasting.  Ear/Nose/Throat: Hearing grossly intact, nares w/o erythema or drainage Eyes: PER, EOMI, sclera nonicteric.  Neck: Supple, no masses.  No JVD.  Pulmonary:  Good air movement, no use of accessory muscles.  Cardiac: RRR Vascular:  4+ hard edema bilaterally Vessel Right Left  Radial Palpable Palpable   Gastrointestinal: soft, non-distended. No guarding/no peritoneal signs.  Musculoskeletal: Uses walker for ambulation..  No deformity or atrophy.  Neurologic: Pain and light touch intact in extremities.  Symmetrical.  Speech is fluent. Motor exam as listed above. Psychiatric: Judgment intact, Mood & affect  appropriate for pt's clinical situation. Dermatologic: No Venous rashes. No Ulcers Noted.  No changes consistent with cellulitis. Lymph : No Cervical lymphadenopathy, dermal thickening bilaterally      ASSESSMENT AND PLAN:  1. Venous ulcer of left leg ([place]) I have given the patient written instructions to her facility to soak her foot in a Dial soap solution 1-2 times a week as well as to try to remove dead skin if possible.  Patient also apply nystatin ointment into the crack, as I believe that this may be a bit of a fungal infection.  We will have the patient return in 8 weeks to look at the progress of her wound.  2. Lymphedema No surgery or intervention at this point in time.    I have reviewed my discussion with the patient regarding venous insufficiency and secondary lymph edema and why it  causes symptoms. I have discussed with the patient the chronic skin changes that accompany these problems and the long term sequela such as ulceration and infection.  Patient will continue wearing graduated compression stockings class 1 (20-30 mmHg) on a daily basis a prescription was given to the patient to keep this updated. The patient will  put the stockings on first thing in the morning and removing them in the evening. The patient is instructed specifically not to sleep in the stockings.  In addition, behavioral modification including elevation during the day will be continued.  Diet and salt restriction was also discussed.  Previous duplex ultrasound of the lower extremities shows normal deep venous system, superficial reflux was not present.    3. Essential hypertension Continue antihypertensive medications as already ordered, these medications have been reviewed and there are no changes at this time.    Current Outpatient Medications on File Prior to Visit  Medication Sig Dispense Refill  . acetaminophen (TYLENOL) 325 MG tablet Take 650 mg by mouth every 6 (six) hours as needed.    .  Cholecalciferol (D3-50) 50000 units capsule Take 50,000 Units by mouth once a week.    . Cyanocobalamin (B-12) 1000 MCG CAPS Take 1,000 mcg by mouth daily. 90 capsule 1  . ferrous sulfate 325 (65 FE) MG tablet Take 325 mg by mouth daily with breakfast.    . gabapentin (NEURONTIN) 100 MG capsule Take 200 mg by mouth 3 (three) times daily.    . insulin aspart (NOVOLOG) 100 UNIT/ML injection Inject into the skin 3 (three) times daily before meals.    Marland Kitchen levothyroxine (SYNTHROID, LEVOTHROID) 125 MCG tablet Take 1 tablet (125 mcg total) by mouth daily before breakfast. 30 tablet 0  . silver sulfADIAZINE (SILVADENE) 1 % cream Apply 1 application topically daily. 50 g 0  . tamsulosin (FLOMAX) 0.4 MG CAPS capsule Take 1 capsule (0.4 mg total) by mouth daily. 30 capsule 0  . glipiZIDE (GLUCOTROL) 5 MG tablet Take by mouth daily before breakfast.     No current facility-administered medications on file prior to visit.     There are no Patient Instructions on file for this visit. No follow-ups on file.   Kris Hartmann, NP  This note was completed with Sales executive.  Any errors are purely unintentional.

## 2019-04-19 ENCOUNTER — Ambulatory Visit (INDEPENDENT_AMBULATORY_CARE_PROVIDER_SITE_OTHER): Payer: Medicare Other | Admitting: Nurse Practitioner

## 2019-04-19 ENCOUNTER — Encounter (INDEPENDENT_AMBULATORY_CARE_PROVIDER_SITE_OTHER): Payer: Self-pay | Admitting: Nurse Practitioner

## 2019-04-19 ENCOUNTER — Other Ambulatory Visit: Payer: Self-pay

## 2019-04-19 VITALS — BP 133/78 | HR 81 | Resp 16 | Wt 218.0 lb

## 2019-04-19 DIAGNOSIS — Z794 Long term (current) use of insulin: Secondary | ICD-10-CM

## 2019-04-19 DIAGNOSIS — I890 Lymphedema, not elsewhere classified: Secondary | ICD-10-CM

## 2019-04-19 DIAGNOSIS — I10 Essential (primary) hypertension: Secondary | ICD-10-CM | POA: Diagnosis not present

## 2019-04-19 DIAGNOSIS — Z79899 Other long term (current) drug therapy: Secondary | ICD-10-CM

## 2019-04-19 DIAGNOSIS — Z87891 Personal history of nicotine dependence: Secondary | ICD-10-CM

## 2019-04-19 DIAGNOSIS — E119 Type 2 diabetes mellitus without complications: Secondary | ICD-10-CM

## 2019-04-19 DIAGNOSIS — I83023 Varicose veins of left lower extremity with ulcer of ankle: Secondary | ICD-10-CM

## 2019-04-19 DIAGNOSIS — L97329 Non-pressure chronic ulcer of left ankle with unspecified severity: Secondary | ICD-10-CM | POA: Diagnosis not present

## 2019-04-19 DIAGNOSIS — L97929 Non-pressure chronic ulcer of unspecified part of left lower leg with unspecified severity: Principal | ICD-10-CM

## 2019-04-19 DIAGNOSIS — I83029 Varicose veins of left lower extremity with ulcer of unspecified site: Secondary | ICD-10-CM

## 2019-04-19 NOTE — Progress Notes (Signed)
SUBJECTIVE:  Patient ID: [name], female    DOB: 1951/03/24, 68 y.o.   MRN: [id number] Chief Complaint  Patient presents with  . Follow-up    8week follow up    HPI  [name] is a 68 y.o. female that presents today for evaluation of the venous ulcer on her left ankle.  She stated that previously the wound was feeling much better however she twisted her ankle and after the twisting of her ankle it seemed to reinjure the venous ulcer.  It is not traditional and the open since, and is located in between ridges/folds of her skin of her ankle.  Today it appears much better and more shallow than previous.  She also states that her facility stopped wrapping her right lower extremity and wrapped her left instead.  Her right lower extremity is very edematous today.  She denies any fever, chills, nausea, vomiting or diarrhea.  She denies any chest pain or shortness of breath.  Past Medical History:  Diagnosis Date  . Adult failure to thrive   . Asthma   . Cellulitis of left foot   . Cellulitis of right foot   . Diabetes mellitus without complication ([place])   . Glaucoma   . HLD (hyperlipidemia)   . Hypertension   . Protein calorie malnutrition ([place])   . 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 Thyroid disease    hypothyroid  . Urinary retention     Past Surgical History:  Procedure Laterality Date  . APPENDECTOMY    . LOWER EXTREMITY ANGIOGRAPHY Left 01/04/2019   Procedure: LOWER EXTREMITY ANGIOGRAPHY;  Surgeon: Algernon Huxley, MD;  Location: [facility];  Service: Cardiovascular;  Laterality: Left;    Social History   Socioeconomic History  . Marital status: Single    Spouse name: Not on file  . Number of children: Not on file  . Years of education: Not on file  . Highest education level: Not on file  Occupational History  . 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 Financial resource strain: Somewhat hard  . Food  insecurity:    Worry: Never true    Inability: Never true  . Transportation needs:    Medical: No    Non-medical: No  Tobacco Use  . Smoking status: Former Research scientist (life sciences)  . Smokeless tobacco: Never Used  Substance and Sexual Activity  . Alcohol use: No  . Drug use: No  . Sexual activity: Not on file  Lifestyle  . Physical activity:    Days per week: 0 days    Minutes per session: Not on file  . Stress: To some extent  Relationships  . Social connections:    Talks on phone: More than three times a week    Gets together: Once a week    Attends religious service: Never    Active member of club or organization: No    Attends meetings of clubs or organizations: Never    Relationship status: Never married  . Intimate partner violence:    Fear of current or ex partner: No    Emotionally abused: No    Physically abused: No    Forced sexual activity: No  Other Topics Concern  . Not on file  Social History Narrative  . Not on file    Family History  Problem Relation Age of Onset  . Diabetes Mother   . Diabetes  Father   . Alzheimer's disease Father   . Breast cancer Neg Hx     Allergies  Allergen Reactions  . Penicillins Swelling    Has patient had a PCN reaction causing immediate rash, facial/tongue/throat swelling, SOB or lightheadedness with hypotension: Unknown Has patient had a PCN reaction causing severe rash involving mucus membranes or skin necrosis: Unknown Has patient had a PCN reaction that required hospitalization: Unknown Has patient had a PCN reaction occurring within the last 10 years: Unknown If all of the above answers are "NO", then may proceed with Cephalosporin use.      Review of Systems   Review of Systems: Negative Unless Checked Constitutional: [] Weight loss  [] Fever  [] Chills Cardiac: [] Chest pain   []  Atrial Fibrillation  [] Palpitations   [] Shortness of breath when laying flat   [] Shortness of breath with exertion. [] Shortness of breath at rest  Vascular:  [] Pain in legs with walking   [] Pain in legs with standing [] Pain in legs when laying flat   [] Claudication    [] Pain in feet when laying flat    [] History of DVT   [] Phlebitis   [x] Swelling in legs   [] Varicose veins   [x] Non-healing ulcers Pulmonary:   [] Uses home oxygen   [] Productive cough   [] Hemoptysis   [] Wheeze  [] COPD   [] Asthma Neurologic:  [] Dizziness   [] Seizures  [] Blackouts [] History of stroke   [] History of TIA  [] Aphasia   [] Temporary Blindness   [] Weakness or numbness in arm   [x] Weakness or numbness in leg Musculoskeletal:   [] Joint swelling   [] Joint pain   [] Low back pain  []  History of Knee Replacement [] Arthritis [] back Surgeries  []  Spinal Stenosis    Hematologic:  [] Easy bruising  [] Easy bleeding   [] Hypercoagulable state   [] Anemic Gastrointestinal:  [] Diarrhea   [] Vomiting  [] Gastroesophageal reflux/heartburn   [] Difficulty swallowing. [] Abdominal pain Genitourinary:  [] Chronic kidney disease   [] Difficult urination  [] Anuric   [] Blood in urine [] Frequent urination  [] Burning with urination   [] Hematuria Skin:  [] Rashes   [x] Ulcers [] Wounds Psychological:  [] History of anxiety   []  History of major depression  []  Memory Difficulties      OBJECTIVE:   Physical Exam  BP 133/78 (BP Location: Right Arm)   Pulse 81   Resp 16   Wt 218 lb (98.9 kg)   LMP  (LMP Unknown)   BMI 37.42 kg/m   Gen: WD/WN, NAD Head: [place]/AT, No temporalis wasting.  Ear/Nose/Throat: Hearing grossly intact, nares w/o erythema or drainage Eyes: PER, EOMI, sclera nonicteric.  Neck: Supple, no masses.  No JVD.  Pulmonary:  Good air movement, no use of accessory muscles.  Cardiac: RRR Vascular:  3+ edema on right lower extremity, 4+ on the left.  Venous ulceration on left medial ankle Vessel Right Left  Radial Palpable Palpable   Gastrointestinal: soft, non-distended. No guarding/no peritoneal signs.  Musculoskeletal: Uses walker for ambulation. No deformity or atrophy.   Neurologic: Pain and light touch intact in extremities.  Symmetrical.  Speech is fluent. Motor exam as listed above. Psychiatric: Judgment intact, Mood & affect appropriate for pt's clinical situation. Dermatologic: No Venous rashes. No Ulcers Noted.  No changes consistent with cellulitis. Lymph : No Cervical lymphadenopathy, severe lichenification bilaterally      ASSESSMENT AND PLAN:  1. Venous ulcer of left leg ([place]) I have sent instructions to the nursing facility to continue to soak her feet in Dial soap solutions as well as to try to gently  removed dead skin from her foot.  I have also sent instructions to apply nystatin cream within the crack, as this seems to be helping.  With this wound having some fungal components it may take time to heal, therefore we will see her in 3 months.  2. Lymphedema Instructions sent to nursing facility to change Unna wraps twice a week in the setting of her other wound care instructions.  On her right lower extremity she should continue to wear compression stockings as well as to elevate her lower extremities as much as possible.  She should also continue to exercise as much as tolerable.  3. Essential hypertension Continue antihypertensive medications as already ordered, these medications have been reviewed and there are no changes at this time.   4. Type 2 diabetes mellitus with insulin therapy ([place]) Continue hypoglycemic medications as already ordered, these medications have been reviewed and there are no changes at this time.  Hgb A1C to be monitored as already arranged by primary service    Current Outpatient Medications on File Prior to Visit  Medication Sig Dispense Refill  . acetaminophen (TYLENOL) 325 MG tablet Take 650 mg by mouth every 6 (six) hours as needed.    . Cholecalciferol (D3-50) 50000 units capsule Take 50,000 Units by mouth once a week.    . Cyanocobalamin (B-12) 1000 MCG CAPS Take 1,000 mcg by mouth daily. 90 capsule 1  .  ferrous sulfate 325 (65 FE) MG tablet Take 325 mg by mouth daily with breakfast.    . gabapentin (NEURONTIN) 100 MG capsule Take 200 mg by mouth 3 (three) times daily.    Marland Kitchen glipiZIDE (GLUCOTROL) 5 MG tablet Take by mouth daily before breakfast.    . insulin aspart (NOVOLOG) 100 UNIT/ML injection Inject into the skin 3 (three) times daily before meals.    Marland Kitchen levothyroxine (SYNTHROID, LEVOTHROID) 125 MCG tablet Take 1 tablet (125 mcg total) by mouth daily before breakfast. 30 tablet 0  . nystatin ointment (MYCOSTATIN) Apply 1 application topically 2 (two) times daily. 30 g 5  . silver sulfADIAZINE (SILVADENE) 1 % cream Apply 1 application topically daily. 50 g 0  . tamsulosin (FLOMAX) 0.4 MG CAPS capsule Take 1 capsule (0.4 mg total) by mouth daily. 30 capsule 0   No current facility-administered medications on file prior to visit.     There are no Patient Instructions on file for this visit. No follow-ups on file.   Kris Hartmann, NP  This note was completed with Sales executive.  Any errors are purely unintentional.

## 2019-06-29 ENCOUNTER — Non-Acute Institutional Stay: Payer: Medicare Other | Admitting: Student

## 2019-06-29 ENCOUNTER — Other Ambulatory Visit: Payer: Self-pay

## 2019-06-29 VITALS — BP 110/64 | HR 68 | Resp 16

## 2019-06-29 DIAGNOSIS — Z515 Encounter for palliative care: Secondary | ICD-10-CM

## 2019-06-29 NOTE — Progress Notes (Signed)
[facility] Consult Note Telephone: [phone]  Fax: [phone]  PATIENT NAME: [name] DOB: Feb 06, 1951 MRN: [id number]  PRIMARY CARE PROVIDER:   Kirk Ruths, MD  REFERRING PROVIDER: Dr. Ouida Sills, [facility] [place]  RESPONSIBLE PARTY: Self    ASSESSMENT: Ms. Overdorf is resting in recliner, watching television upon arrival. NP explained reason for Palliative Medicine visit; she welcomes visit. She is alert and oriented x 3; no acute distress noted. We discussed how she has been doing overall. We discussed symptom management and ongoing goals of care. We specfically talked about her leg edema; she has a follow up appointment this month with [facility]. Discussed with med tech Levada Dy. Palliative Medicine will continue to provide support and make recommendations as needed.   Labs 06/24/2019: wbc 7.4, rbc 4.28, hemoglobin 12.3, hematocrit 37.7, A1C 7.8, iron 93, ferritin 169, sodium 141, potassium 4.8, C02 27, chloride 105, glucose 210, BUN 30, creatinine 1.35, AST 16, ALT26, albumin 3.4, cholesterol 253, HDL 67, triglycerides 169, LDL152.     RECOMMENDATIONS and PLAN:  1. Code status: Full Code. 2. Medical goals of therapy: Follow up with [facility] as scheduled. Palliative Medicine will continue to follow and make recommendations as needed. 3. Symptom management: edema: continue to elevate legs while in recliner, continue support stockings to legs as directed. 4. Discharge Planning: Ms. Scroggin will continue to reside at the [facility].   I spent 15 minutes providing this consultation,  from 10:30am to 10:45am. More than 50% of the time in this consultation was spent coordinating communication.   HISTORY OF PRESENT ILLNESS:  [name] is a 68 y.o. female with multiple medical problems including lymphedema, PVD, hyperlipidemia, failure to thrive, hypothyroidism, neuropathy, protein calorie malnutrition, hx  of pressure ulcer of scrum, right buttocks, cellulitis of bilateral feet.  Palliative Care was asked to help address goals of care; she is seen for follow up visit today. She reports doing good. She requires some assistance with adl's. She uses rollator walker for ambulation. She denies pain, shortness of breath, constipation or diarrhea. She states that her edema to legs has actually improved. Her pants are not fitting as tight on her left leg. She has been able to elevate legs more since getting a recliner in the past 2 months; she wears support hose/stockings. She reports a fair appetite; snacks during the day as well. She is sleeping well at night. She denies any needs at present. She has follow up appointment with [facility] this month. She is her own responsible party.   CODE STATUS: Full Code   PPS: 50% HOSPICE ELIGIBILITY/DIAGNOSIS: TBD  PAST MEDICAL HISTORY:  Past Medical History:  Diagnosis Date  . Adult failure to thrive   . Asthma   . Cellulitis of left foot   . Cellulitis of right foot   . Diabetes mellitus without complication ([place])   . Glaucoma   . HLD (hyperlipidemia)   . Hypertension   . Protein calorie malnutrition ([place])   . 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 Thyroid disease    hypothyroid  . Urinary retention     SOCIAL HX:  Social History   Tobacco Use  . Smoking status: Former Research scientist (life sciences)  . Smokeless tobacco: Never Used  Substance Use Topics  . Alcohol use: No    ALLERGIES:  Allergies  Allergen Reactions  . Penicillins Swelling    Has patient had  a PCN reaction causing immediate rash, facial/tongue/throat swelling, SOB or lightheadedness with hypotension: Unknown Has patient had a PCN reaction causing severe rash involving mucus membranes or skin necrosis: Unknown Has patient had a PCN reaction that required hospitalization: Unknown Has patient had a PCN reaction occurring within the last 10 years: Unknown If all of the above answers are  "NO", then may proceed with Cephalosporin use.      PERTINENT MEDICATIONS:  Outpatient Encounter Medications as of 06/29/2019  Medication Sig  . acetaminophen (TYLENOL) 325 MG tablet Take 650 mg by mouth every 6 (six) hours as needed.  . Cholecalciferol (D3-50) 50000 units capsule Take 50,000 Units by mouth once a week.  . Cyanocobalamin (B-12) 1000 MCG CAPS Take 1,000 mcg by mouth daily.  . ferrous sulfate 325 (65 FE) MG tablet Take 325 mg by mouth daily with breakfast.  . gabapentin (NEURONTIN) 100 MG capsule Take 200 mg by mouth 3 (three) times daily.  Marland Kitchen glipiZIDE (GLUCOTROL) 5 MG tablet Take by mouth daily before breakfast.  . insulin aspart (NOVOLOG) 100 UNIT/ML injection Inject into the skin 3 (three) times daily before meals.  Marland Kitchen levothyroxine (SYNTHROID, LEVOTHROID) 125 MCG tablet Take 1 tablet (125 mcg total) by mouth daily before breakfast.  . nystatin ointment (MYCOSTATIN) Apply 1 application topically 2 (two) times daily.  . silver sulfADIAZINE (SILVADENE) 1 % cream Apply 1 application topically daily.  . tamsulosin (FLOMAX) 0.4 MG CAPS capsule Take 1 capsule (0.4 mg total) by mouth daily.   No facility-administered encounter medications on file as of 06/29/2019.     PHYSICAL EXAM:   General: NAD Cardiovascular: regular rate and rhythm Pulmonary: clear ant fields Abdomen: soft, nontender, + bowel sounds GU: no suprapubic tenderness Extremities: 2-3+ edema, left > right Skin: no rashes Neurological: Weakness but otherwise nonfocal  Ezekiel Slocumb, NP

## 2019-07-20 ENCOUNTER — Ambulatory Visit (INDEPENDENT_AMBULATORY_CARE_PROVIDER_SITE_OTHER): Payer: Medicare Other | Admitting: Nurse Practitioner

## 2019-07-20 ENCOUNTER — Other Ambulatory Visit: Payer: Self-pay

## 2019-07-20 ENCOUNTER — Encounter (INDEPENDENT_AMBULATORY_CARE_PROVIDER_SITE_OTHER): Payer: Self-pay | Admitting: Nurse Practitioner

## 2019-07-20 VITALS — BP 120/72 | HR 69 | Resp 12 | Ht 64.0 in | Wt 209.0 lb

## 2019-07-20 DIAGNOSIS — Z87891 Personal history of nicotine dependence: Secondary | ICD-10-CM

## 2019-07-20 DIAGNOSIS — I83029 Varicose veins of left lower extremity with ulcer of unspecified site: Secondary | ICD-10-CM

## 2019-07-20 DIAGNOSIS — I890 Lymphedema, not elsewhere classified: Secondary | ICD-10-CM

## 2019-07-20 DIAGNOSIS — I10 Essential (primary) hypertension: Secondary | ICD-10-CM

## 2019-07-20 DIAGNOSIS — Z79899 Other long term (current) drug therapy: Secondary | ICD-10-CM

## 2019-07-20 DIAGNOSIS — L97929 Non-pressure chronic ulcer of unspecified part of left lower leg with unspecified severity: Secondary | ICD-10-CM | POA: Diagnosis not present

## 2019-07-20 NOTE — Progress Notes (Signed)
SUBJECTIVE:  Patient ID: [name], female    DOB: [birth year], 68 y.o.   MRN: [id number] Chief Complaint  Patient presents with  . Follow-up    HPI  [name] is a 68 y.o. female that presents today for follow-up of ulceration on her left lower extremity as well as lymphedema.  Previously the patient has been having her own wraps changed by her facility.  At this time the left lower extremity ulceration has resolved and her lymphedema is much more under control.  The patient states that her left ankle feels much better than at her previous visit.  Overall she endorses doing very well.  She denies any fever, chills, nausea, vomiting or diarrhea.  Another aspect that the patient accredits to her improvement is having a new chair that allows her to fully elevate her legs above the level of her heart.  She states that she spends a significant amount of time there as well.  Past Medical History:  Diagnosis Date  . Adult failure to thrive   . Asthma   . Cellulitis of left foot   . Cellulitis of right foot   . Diabetes mellitus without complication ([place])   . Glaucoma   . HLD (hyperlipidemia)   . Hypertension   . Protein calorie malnutrition ([place])   . Stage 2 skin ulcer of sacral region ([place])   . Thyroid disease    hypothyroid  . Urinary retention     Past Surgical History:  Procedure Laterality Date  . APPENDECTOMY    . LOWER EXTREMITY ANGIOGRAPHY Left 01/04/2019   Procedure: LOWER EXTREMITY ANGIOGRAPHY;  Surgeon: Algernon Huxley, MD;  Location: [facility];  Service: Cardiovascular;  Laterality: Left;    Social History   Socioeconomic History  . Marital status: Single    Spouse name: Not on file  . Number of children: Not on file  . Years of education: Not on file  . Highest education level: Not on file  Occupational History  . 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 Financial resource strain: Somewhat hard  . Food  insecurity    Worry: Never true    Inability: Never true  . Transportation needs    Medical: No    Non-medical: No  Tobacco Use  . Smoking status: Former Research scientist (life sciences)  . Smokeless tobacco: Never Used  Substance and Sexual Activity  . Alcohol use: No  . Drug use: No  . Sexual activity: Not on file  Lifestyle  . Physical activity    Days per week: 0 days    Minutes per session: Not on file  . Stress: To some extent  Relationships  . Social connections    Talks on phone: More than three times a week    Gets together: Once a week    Attends religious service: Never    Active member of club or organization: No    Attends meetings of clubs or organizations: Never    Relationship status: Never married  . Intimate partner violence    Fear of current or ex partner: No    Emotionally abused: No    Physically abused: No    Forced sexual activity: No  Other Topics Concern  . Not on file  Social History Narrative  . Not on file    Family History  Problem Relation Age of Onset  . Diabetes Mother   . Diabetes Father   .  Alzheimer's disease Father   . Breast cancer Neg Hx     Allergies  Allergen Reactions  . Penicillins Swelling    Has patient had a PCN reaction causing immediate rash, facial/tongue/throat swelling, SOB or lightheadedness with hypotension: Unknown Has patient had a PCN reaction causing severe rash involving mucus membranes or skin necrosis: Unknown Has patient had a PCN reaction that required hospitalization: Unknown Has patient had a PCN reaction occurring within the last 10 years: Unknown If all of the above answers are "NO", then may proceed with Cephalosporin use.      Review of Systems   Review of Systems: Negative Unless Checked Constitutional: [] Weight loss  [] Fever  [] Chills Cardiac: [] Chest pain   []  Atrial Fibrillation  [] Palpitations   [] Shortness of breath when laying flat   [] Shortness of breath with exertion. [] Shortness of breath at rest  Vascular:  [] Pain in legs with walking   [] Pain in legs with standing [] Pain in legs when laying flat   [] Claudication    [] Pain in feet when laying flat    [] History of DVT   [] Phlebitis   [x] Swelling in legs   [] Varicose veins   [] Non-healing ulcers Pulmonary:   [] Uses home oxygen   [] Productive cough   [] Hemoptysis   [] Wheeze  [] COPD   [] Asthma Neurologic:  [] Dizziness   [] Seizures  [] Blackouts [] History of stroke   [] History of TIA  [] Aphasia   [] Temporary Blindness   [] Weakness or numbness in arm   [x] Weakness or numbness in leg Musculoskeletal:   [] Joint swelling   [] Joint pain   [] Low back pain  []  History of Knee Replacement [] Arthritis [] back Surgeries  []  Spinal Stenosis    Hematologic:  [] Easy bruising  [] Easy bleeding   [] Hypercoagulable state   [] Anemic Gastrointestinal:  [] Diarrhea   [] Vomiting  [] Gastroesophageal reflux/heartburn   [] Difficulty swallowing. [] Abdominal pain Genitourinary:  [] Chronic kidney disease   [] Difficult urination  [] Anuric   [] Blood in urine [] Frequent urination  [] Burning with urination   [] Hematuria Skin:  [x] Rashes   [] Ulcers [] Wounds Psychological:  [] History of anxiety   []  History of major depression  []  Memory Difficulties      OBJECTIVE:   Physical Exam  BP 120/72 (BP Location: Left Arm, Patient Position: Sitting, Cuff Size: Normal)   Pulse 69   Resp 12   Ht 5\' 4"  (1.626 m)   Wt 209 lb (94.8 kg)   LMP  (LMP Unknown)   BMI 35.87 kg/m   Gen: WD/WN, NAD Head: [place]/AT, No temporalis wasting.  Ear/Nose/Throat: Hearing grossly intact, nares w/o erythema or drainage Eyes: PER, EOMI, sclera nonicteric.  Neck: Supple, no masses.  No JVD.  Pulmonary:  Good air movement, no use of accessory muscles.  Cardiac: RRR Vascular:  2+ edema bilaterally Vessel Right Left  Radial Palpable Palpable  Gastrointestinal: soft, non-distended. No guarding/no peritoneal signs.  Musculoskeletal: Uses walker for ambulation. No deformity or atrophy.  Neurologic: Pain  and light touch intact in extremities.  Symmetrical.  Speech is fluent. Motor exam as listed above. Psychiatric: Judgment intact, Mood & affect appropriate for pt's clinical situation. Dermatologic: Stasis dermatitis bilaterally no Ulcers Noted.  No changes consistent with cellulitis. Lymph : No Cervical lymphadenopathy, lichenification bilaterally     ASSESSMENT AND PLAN:  1. Lymphedema The patient had been having her leg wraps changed by her facility.  The swelling in her leg today appears much better, in fact better than I have seen in previous visits.  At this time we will  transition to compression stockings as well as utilizing other conservative therapies such as elevation and exercise.  Patient will follow-up in 6 months.  She is instructed to follow-up sooner if she is found to have new lower extremity ulcerations, excessive swelling, excessive weeping, and new pain.  2. Venous ulcer of left leg (HCC) Previous ulceration is resolved.  The patient states that that area feels much better  3. Essential hypertension Continue antihypertensive medications as already ordered, these medications have been reviewed and there are no changes at this time.    Current Outpatient Medications on File Prior to Visit  Medication Sig Dispense Refill  . Cholecalciferol (D3-50) 50000 units capsule Take 50,000 Units by mouth once a week.    . ferrous sulfate 325 (65 FE) MG tablet Take 325 mg by mouth daily with breakfast.    . gabapentin (NEURONTIN) 100 MG capsule Take 200 mg by mouth 3 (three) times daily.    Marland Kitchen glipiZIDE (GLUCOTROL) 5 MG tablet Take by mouth daily before breakfast.    . insulin aspart (NOVOLOG) 100 UNIT/ML injection Inject into the skin 3 (three) times daily before meals.    Marland Kitchen levothyroxine (SYNTHROID, LEVOTHROID) 125 MCG tablet Take 1 tablet (125 mcg total) by mouth daily before breakfast. 30 tablet 0  . nystatin ointment (MYCOSTATIN) Apply 1 application topically 2 (two) times daily.  30 g 5  . tamsulosin (FLOMAX) 0.4 MG CAPS capsule Take 1 capsule (0.4 mg total) by mouth daily. 30 capsule 0  . acetaminophen (TYLENOL) 325 MG tablet Take 650 mg by mouth every 6 (six) hours as needed.    . Cyanocobalamin (B-12) 1000 MCG CAPS Take 1,000 mcg by mouth daily. 90 capsule 1  . silver sulfADIAZINE (SILVADENE) 1 % cream Apply 1 application topically daily. 50 g 0   No current facility-administered medications on file prior to visit.     There are no Patient Instructions on file for this visit. Return in about 6 months (around 01/20/2020) for Lymphedema.   Kris Hartmann, NP  This note was completed with Sales executive.  Any errors are purely unintentional.

## 2019-08-10 ENCOUNTER — Other Ambulatory Visit: Payer: Self-pay

## 2019-08-10 ENCOUNTER — Other Ambulatory Visit: Payer: Medicare Other

## 2019-08-10 DIAGNOSIS — Q6211 Congenital occlusion of ureteropelvic junction: Secondary | ICD-10-CM

## 2019-08-10 NOTE — Progress Notes (Signed)
02/10/2019 2:00 PM   [name] Jul 09, 1951 [id number]  Referring provider: Kirk Ruths, MD [facility] [place],  [place] [address]  Chief Complaint  Patient presents with  . Hydronephrosis    HPI: [name] is a 68 y.o. female female with uncontrolled DM, lymphadenitits, cellulitis and bilateral hydronephrosis who presents today for a six month follow up.  Bilateral hydronephrosis Her Foley was placed on March 30, 2018 after a renal ultrasound was performed due to increasing serum creatinine levels.  PVR was > 1500cc.  RUS on 03/30/2018 noted mild right-sided and moderate left-sided hydronephrosis.  She was initiated on tamsulosin 0.4 mg daily. Foley catheter was exchanged on 06/23/2018.  RUS on 06/23/2018 noted interval resolution of right-sided hydronephrosis. Persistent moderate left-sided hydronephrosis.  Partial distention of the urinary bladder with a Foley catheter present.  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  There is a moderate to marked left-sided hydronephrosis to the level of the UPJ. No stone or mass noted. 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  Right-sided pelvocaliectasis without evidence for right-sided obstructive uropathy.  Aortic Atherosclerosis.  Bilateral inguinal adenopathy. Etiology indeterminate.  Differential considerations include reactive adenopathy versus metastatic disease or lymphoproliferative disorder.  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 The left renal moiety is larger than the right.  The right renal moiety demonstrates no evidence of obstruction.  The left renal moiety demonstrates a dilated renal collecting system which partially washes out after Lasix. 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  Recent creatinine 1.12 08/10/2019.  Her PVR is 0 mL.    Patient denies any gross hematuria, dysuria or suprapubic/flank pain.   Patient denies any fevers, chills, nausea or vomiting.    PMH: Past Medical History:  Diagnosis Date  . Adult failure to thrive   . Asthma   . Cellulitis of left foot   . Cellulitis of right foot   . Diabetes mellitus without complication ([place])   . Glaucoma   . HLD (hyperlipidemia)   . Hypertension   . Protein calorie malnutrition ([place])   . Stage 2 skin ulcer of sacral region ([place])   . Thyroid disease    hypothyroid  . Urinary retention     Surgical History: Past Surgical History:  Procedure Laterality Date  . APPENDECTOMY    . LOWER EXTREMITY ANGIOGRAPHY Left 01/04/2019   Procedure: LOWER EXTREMITY ANGIOGRAPHY;  Surgeon: Algernon Huxley, MD;  Location: [facility];  Service: Cardiovascular;  Laterality: Left;    Home Medications:  Allergies as of 08/11/2019      Reactions   Penicillins Swelling   Has patient had a PCN reaction causing immediate rash, facial/tongue/throat swelling, SOB or lightheadedness with hypotension: Unknown Has patient had a PCN reaction causing severe rash involving mucus membranes or skin necrosis: Unknown Has patient had a PCN reaction that required hospitalization: Unknown Has patient had a PCN reaction occurring within the last 10 years: Unknown If all of the above answers are "NO", then may proceed with Cephalosporin use.      Medication List       Accurate as of August 11, 2019  2:00 PM. If you have any questions, ask your nurse or doctor.        acetaminophen 325 MG tablet Commonly known as: TYLENOL Take 650 mg by mouth every 6 (six) hours as needed.   B-12 1000 MCG  Caps Take 1,000 mcg by mouth daily.   D3-50 1.25 MG (50000 UT) capsule Generic drug: Cholecalciferol Take 50,000 Units by mouth once a week.   ferrous sulfate 325 (65 FE) MG tablet Take 325 mg by mouth daily with breakfast.   gabapentin 100 MG capsule Commonly known as: NEURONTIN Take 200 mg by mouth 3 (three) times daily.   glipiZIDE 5 MG tablet Commonly  known as: GLUCOTROL Take by mouth daily before breakfast.   insulin aspart 100 UNIT/ML injection Commonly known as: novoLOG Inject into the skin 3 (three) times daily before meals.   levothyroxine 125 MCG tablet Commonly known as: SYNTHROID Take 1 tablet (125 mcg total) by mouth daily before breakfast.   nystatin ointment Commonly known as: MYCOSTATIN Apply 1 application topically 2 (two) times daily.   silver sulfADIAZINE 1 % cream Commonly known as: SILVADENE Apply 1 application topically daily.   tamsulosin 0.4 MG Caps capsule Commonly known as: FLOMAX Take 1 capsule (0.4 mg total) by mouth daily.       Allergies:  Allergies  Allergen Reactions  . Penicillins Swelling    Has patient had a PCN reaction causing immediate rash, facial/tongue/throat swelling, SOB or lightheadedness with hypotension: Unknown Has patient had a PCN reaction causing severe rash involving mucus membranes or skin necrosis: Unknown Has patient had a PCN reaction that required hospitalization: Unknown Has patient had a PCN reaction occurring within the last 10 years: Unknown If all of the above answers are "NO", then may proceed with Cephalosporin use.     Family History: Family History  Problem Relation Age of Onset  . Diabetes Mother   . Diabetes Father   . Alzheimer's disease Father   . Breast cancer Neg Hx     Social History:  reports that she has quit smoking. She has never used smokeless tobacco. She reports that she does not drink alcohol or use drugs.  ROS: UROLOGY Frequent Urination?: No Hard to postpone urination?: No Burning/pain with urination?: No Get up at night to urinate?: Yes Leakage of urine?: No Urine stream starts and stops?: No Trouble starting stream?: No Do you have to strain to urinate?: No Blood in urine?: No Urinary tract infection?: No Sexually transmitted disease?: No Injury to kidneys or bladder?: No Painful intercourse?: No Weak stream?: No  Currently pregnant?: No Vaginal bleeding?: No Last menstrual period?: n  Gastrointestinal Nausea?: No Vomiting?: No Indigestion/heartburn?: No Diarrhea?: No Constipation?: No  Constitutional Fever: No Night sweats?: No Weight loss?: No Fatigue?: No  Skin Skin rash/lesions?: No Itching?: No  Eyes Blurred vision?: No Double vision?: No  Ears/Nose/Throat Sore throat?: No Sinus problems?: No  Hematologic/Lymphatic Swollen glands?: No Easy bruising?: No  Cardiovascular Leg swelling?: No Chest pain?: No  Respiratory Cough?: No Shortness of breath?: No  Endocrine Excessive thirst?: No  Musculoskeletal Back pain?: No Joint pain?: No  Neurological Headaches?: No Dizziness?: No  Psychologic Depression?: No Anxiety?: No  Physical Exam: BP (!) 141/82 (BP Location: Left Arm, Patient Position: Sitting, Cuff Size: Normal)   Pulse 87   Ht 5\' 4"  (1.626 m)   Wt 215 lb 12.8 oz (97.9 kg)   LMP  (LMP Unknown)   BMI 37.04 kg/m   Constitutional:  Well nourished. Alert and oriented, No acute distress. HEENT: [place] AT, moist mucus membranes.  Trachea midline, no masses. Cardiovascular: No clubbing, cyanosis, or edema. Respiratory: Normal respiratory effort, no increased work of breathing. Neurologic: Grossly intact, no focal deficits, moving all 4 extremities. Psychiatric: Normal  mood and affect.   Laboratory Data: Lab Results  Component Value Date   WBC 6.8 10/05/2018   HGB 10.9 (L) 10/05/2018   HCT 34.7 (L) 10/05/2018   MCV 89.7 10/05/2018   PLT 248 10/05/2018    Lab Results  Component Value Date   CREATININE 1.12 (H) 08/10/2019   Lab Results  Component Value Date   AST 20 09/09/2018   Lab Results  Component Value Date   ALT 13 09/09/2018   I have reviewed the labs.  Pertinent Imaging:  Results for orders placed or performed in visit on 08/11/19  Bladder Scan (Post Void Residual) in office  Result Value Ref Range   Scan Result 0       Assessment & Plan:    1. Horseshoe kidney - Intervention is not warranted at this time unless hydronephrosis becomes worse, patient becomes symptomatic or renal function worsens - will obtain a RUS at this time for surveillance  - RTC in 12 months for symptom recheck and repeat creatinine - if RUS is stable - will call with results   2. Urinary retention - 0 mL PVR - Patient to return if unable to void or experiences suprapubic discomfort - RTC in 12 months for PVR   3. Inguinal adenopathy - Followed by Dr. Tasia Catchings at the cancer center  Return for I will call patient with results.  Zara Council, PA-C  [facility] [address]  [place], [place] [address] [phone]

## 2019-08-11 ENCOUNTER — Ambulatory Visit (INDEPENDENT_AMBULATORY_CARE_PROVIDER_SITE_OTHER): Payer: Medicare Other | Admitting: Urology

## 2019-08-11 ENCOUNTER — Encounter: Payer: Self-pay | Admitting: Urology

## 2019-08-11 VITALS — BP 141/82 | HR 87 | Ht 64.0 in | Wt 215.8 lb

## 2019-08-11 DIAGNOSIS — I70243 Atherosclerosis of native arteries of left leg with ulceration of ankle: Secondary | ICD-10-CM | POA: Diagnosis not present

## 2019-08-11 DIAGNOSIS — Q631 Lobulated, fused and horseshoe kidney: Secondary | ICD-10-CM | POA: Diagnosis not present

## 2019-08-11 DIAGNOSIS — Z87898 Personal history of other specified conditions: Secondary | ICD-10-CM

## 2019-08-11 DIAGNOSIS — N1339 Other hydronephrosis: Secondary | ICD-10-CM | POA: Diagnosis not present

## 2019-08-11 LAB — BLADDER SCAN AMB NON-IMAGING: Scan Result: 0

## 2019-08-11 LAB — BASIC METABOLIC PANEL
BUN/Creatinine Ratio: 24 (ref 12–28)
BUN: 27 mg/dL (ref 8–27)
CO2: 21 mmol/L (ref 20–29)
Calcium: 9.3 mg/dL (ref 8.7–10.3)
Chloride: 105 mmol/L (ref 96–106)
Creatinine, Ser: 1.12 mg/dL — ABNORMAL HIGH (ref 0.57–1.00)
GFR calc Af Amer: 59 mL/min/{1.73_m2} — ABNORMAL LOW (ref >59)
GFR calc non Af Amer: 51 mL/min/{1.73_m2} — ABNORMAL LOW (ref >59)
Glucose: 178 mg/dL — ABNORMAL HIGH (ref 65–99)
Potassium: 4.7 mmol/L (ref 3.5–5.2)
Sodium: 140 mmol/L (ref 134–144)

## 2019-08-25 ENCOUNTER — Other Ambulatory Visit: Payer: Self-pay

## 2019-08-25 ENCOUNTER — Ambulatory Visit
Admission: RE | Admit: 2019-08-25 | Discharge: 2019-08-25 | Disposition: A | Discharge status: Home / Self Care | Payer: Medicare Other | Source: Ambulatory Visit | Attending: Urology | Admitting: Urology

## 2019-08-25 DIAGNOSIS — N1339 Other hydronephrosis: Secondary | ICD-10-CM

## 2019-08-30 ENCOUNTER — Other Ambulatory Visit: Payer: Self-pay | Admitting: Urology

## 2019-08-30 DIAGNOSIS — Q631 Lobulated, fused and horseshoe kidney: Secondary | ICD-10-CM

## 2019-08-31 ENCOUNTER — Telehealth: Payer: Self-pay

## 2019-08-31 NOTE — Telephone Encounter (Signed)
-----   Message from Nori Riis, PA-C sent at 08/30/2019  9:54 PM EDT ----- Please let Mrs. Grimme know that her RUS did not adequately demonstrate her kidneys, therefore we need to pursue a non contrast CT scan of her kidneys.  I have placed the orders.

## 2019-08-31 NOTE — Telephone Encounter (Signed)
Called pt's home ([facility]) Left deatiled message with care manager per DPR.

## 2019-09-10 ENCOUNTER — Telehealth: Payer: Self-pay | Admitting: Urology

## 2019-09-10 NOTE — Telephone Encounter (Signed)
We will look into it and have scheduling call pt.

## 2019-09-10 NOTE — Telephone Encounter (Signed)
Would someone look into why her CT scan has not been scheduled?

## 2019-09-10 NOTE — Telephone Encounter (Signed)
Manuela Schwartz in scheduling will contact her to schedule    [name]

## 2019-09-14 ENCOUNTER — Other Ambulatory Visit: Payer: Self-pay

## 2019-09-14 ENCOUNTER — Non-Acute Institutional Stay: Payer: Medicare Other | Admitting: Adult Health Nurse Practitioner

## 2019-09-14 DIAGNOSIS — Z515 Encounter for palliative care: Secondary | ICD-10-CM

## 2019-09-14 NOTE — Progress Notes (Signed)
[facility] Consult Note Telephone: [phone]  Fax: [phone]  PATIENT NAME: [name] DOB: [birth year] MRN: [id number]  PRIMARY CARE PROVIDER:   Kirk Ruths, MD  REFERRING PROVIDER:  Kirk Ruths, MD [place],  [place] [address]  RESPONSIBLE PARTY:   self     RECOMMENDATIONS and PLAN:  1.  Advanced care planning.  Patient is currently a full code. NP introduced ACP today and discussed wishes on MOST form.  States that she thinks she wants to be a DNR but would like to go over it more.  Left MOST form and information about the MOST form with patient and will follow up in about a month to further discuss and fill out the MOST form.    2.  Lymphedema/PVD.  She has significant edema in bilateral legs.  She is being seen by vein and vascular for this.  States that she had an appointment there about 3 weeks ago and she no longer has to wrap her legs at this time and that they have cleared to wait and have her next visit in 6 months.  Denies SOB, cough, chest pain. Continue follow up and recommendations by vein and vascular.    Overall patient is doing well.  She has no new complaints today.  States her appetite is good.  No falls, infection, or hospitalization since last visit.  Will follow up in about a month for further ACP discussion.  I spent 30 minutes providing this consultation,  from 3:00 to 3:30. More than 50% of the time in this consultation was spent coordinating communication.   HISTORY OF PRESENT ILLNESS:  [name] is a 68 y.o. year old female with multiple medical problems including lymphedema, PVD, hyperlipidemia, hypothyroidism, neuropathy. Palliative Care was asked to help address goals of care.   CODE STATUS: Full code  PPS: 50% HOSPICE ELIGIBILITY/DIAGNOSIS: TBD  PHYSICAL EXAM:   General: NAD, frail appearing Extremities: 2-3+ pitting edema, patient  wearing TED hose; no joint deformities Neurological: Weakness but otherwise nonfocal  PAST MEDICAL HISTORY:  Past Medical History:  Diagnosis Date  . Adult failure to thrive   . Asthma   . Cellulitis of left foot   . Cellulitis of right foot   . Diabetes mellitus without complication ([place])   . Glaucoma   . HLD (hyperlipidemia)   . Hypertension   . Protein calorie malnutrition ([place])   . Stage 2 skin ulcer of sacral region ([place])   . Thyroid disease    hypothyroid  . Urinary retention     SOCIAL HX:  Social History   Tobacco Use  . Smoking status: Former Research scientist (life sciences)  . Smokeless tobacco: Never Used  Substance Use Topics  . Alcohol use: No    ALLERGIES:  Allergies  Allergen Reactions  . Penicillins Swelling    Has patient had a PCN reaction causing immediate rash, facial/tongue/throat swelling, SOB or lightheadedness with hypotension: Unknown Has patient had a PCN reaction causing severe rash involving mucus membranes or skin necrosis: Unknown Has patient had a PCN reaction that required hospitalization: Unknown Has patient had a PCN reaction occurring within the last 10 years: Unknown If all of the above answers are "NO", then may proceed with Cephalosporin use.      PERTINENT MEDICATIONS:  Outpatient Encounter Medications as of 09/14/2019  Medication Sig  . acetaminophen (TYLENOL) 325 MG tablet Take 650 mg by mouth every  6 (six) hours as needed.  . Cholecalciferol (D3-50) 50000 units capsule Take 50,000 Units by mouth once a week.  . Cyanocobalamin (B-12) 1000 MCG CAPS Take 1,000 mcg by mouth daily.  . ferrous sulfate 325 (65 FE) MG tablet Take 325 mg by mouth daily with breakfast.  . gabapentin (NEURONTIN) 100 MG capsule Take 200 mg by mouth 3 (three) times daily.  Marland Kitchen glipiZIDE (GLUCOTROL) 5 MG tablet Take by mouth daily before breakfast.  . insulin aspart (NOVOLOG) 100 UNIT/ML injection Inject into the skin 3 (three) times daily before meals.  Marland Kitchen levothyroxine (SYNTHROID,  LEVOTHROID) 125 MCG tablet Take 1 tablet (125 mcg total) by mouth daily before breakfast.  . nystatin ointment (MYCOSTATIN) Apply 1 application topically 2 (two) times daily.  . silver sulfADIAZINE (SILVADENE) 1 % cream Apply 1 application topically daily.  . tamsulosin (FLOMAX) 0.4 MG CAPS capsule Take 1 capsule (0.4 mg total) by mouth daily.   No facility-administered encounter medications on file as of 09/14/2019.       Amy Jenetta Downer, NP

## 2019-09-22 ENCOUNTER — Other Ambulatory Visit: Payer: Self-pay

## 2019-09-22 ENCOUNTER — Ambulatory Visit
Admission: RE | Admit: 2019-09-22 | Discharge: 2019-09-22 | Disposition: A | Discharge status: Home / Self Care | Payer: Medicare Other | Source: Ambulatory Visit | Attending: Urology | Admitting: Urology

## 2019-09-22 DIAGNOSIS — Q631 Lobulated, fused and horseshoe kidney: Secondary | ICD-10-CM | POA: Diagnosis present

## 2019-09-24 ENCOUNTER — Other Ambulatory Visit: Payer: Self-pay | Admitting: Urology

## 2019-09-24 DIAGNOSIS — N1330 Unspecified hydronephrosis: Secondary | ICD-10-CM

## 2019-09-24 NOTE — Progress Notes (Signed)
Orders placed for renal lasix scan

## 2019-09-27 ENCOUNTER — Telehealth: Payer: Self-pay | Admitting: Family Medicine

## 2019-09-27 NOTE — Telephone Encounter (Signed)
Spoke to appointment coordinator at [facility] and notified her of the CT results and Imaging will be contact to get appointment scheduled.

## 2019-09-27 NOTE — Telephone Encounter (Signed)
-----   Message from Nori Riis, PA-C sent at 09/24/2019  8:30 AM EDT ----- Please let Mrs. Radloff know that her CT scan shows that her kidney is swollen and we need to check its function.  We need to get a renal lasix study at this time as that will evaluate for function.

## 2019-10-19 ENCOUNTER — Encounter
Admission: RE | Admit: 2019-10-19 | Discharge: 2019-10-19 | Disposition: A | Discharge status: Home / Self Care | Payer: Medicare Other | Source: Ambulatory Visit | Attending: Urology | Admitting: Urology

## 2019-10-19 ENCOUNTER — Other Ambulatory Visit: Payer: Self-pay

## 2019-10-19 DIAGNOSIS — Z79899 Other long term (current) drug therapy: Secondary | ICD-10-CM | POA: Diagnosis not present

## 2019-10-19 DIAGNOSIS — N1330 Unspecified hydronephrosis: Secondary | ICD-10-CM | POA: Insufficient documentation

## 2019-10-19 DIAGNOSIS — Q631 Lobulated, fused and horseshoe kidney: Secondary | ICD-10-CM | POA: Insufficient documentation

## 2019-10-19 LAB — GLUCOSE, CAPILLARY: Glucose-Capillary: 98 mg/dL (ref 70–99)

## 2019-10-19 MED ORDER — FUROSEMIDE 10 MG/ML IJ SOLN
48.9500 mg | Freq: Once | INTRAMUSCULAR | Status: DC
  Filled 2019-10-19: qty 4.9

## 2019-10-19 MED ORDER — TECHNETIUM TC 99M MERTIATIDE
5.4900 | Freq: Once | INTRAVENOUS | Status: AC | PRN
  Administered 2019-10-19: 5.49 via INTRAVENOUS

## 2019-10-20 ENCOUNTER — Non-Acute Institutional Stay: Payer: Medicare Other | Admitting: Adult Health Nurse Practitioner

## 2019-10-20 DIAGNOSIS — Z515 Encounter for palliative care: Secondary | ICD-10-CM

## 2019-10-20 NOTE — Progress Notes (Signed)
[facility] Consult Note Telephone: [phone]  Fax: [phone]  PATIENT NAME: [name] DOB: December 04, 1951 MRN: [id number]  PRIMARY CARE PROVIDER:   Kirk Ruths, MD  REFERRING PROVIDER:  Kirk Ruths, MD [place],  [place] [address]  RESPONSIBLE PARTY:   self     RECOMMENDATIONS and PLAN:  1.  Advanced care planning.  Today's visit was to go over ACP. Went over MOST form and answered questions. Patient wishes to be a DNR.  Filled out MOST with limited interventions, antibiotics for infection, IV fluids for a trial period, and no feeding tube.  Uploaded to [facility] and left with staff at the facility  Overall patient has no new concerns.  Has not had any falls, infections, or hospitalizations since last visit.  Will continue to evaluate for symptom management every 6-8 weeks.  I spent 30 minutes providing this consultation,  from 12:00 to 12:30. More than 50% of the time in this consultation was spent coordinating communication.   HISTORY OF PRESENT ILLNESS:  [name] is a 68 y.o. year old female with multiple medical problems including lymphedema, PVD,hyperlipidemia, hypothyroidism, neuropathy. Palliative Care was asked to help address goals of care.   CODE STATUS: DNR  PPS: 50% HOSPICE ELIGIBILITY/DIAGNOSIS: TBD  PHYSICAL EXAM:   Deferred  PAST MEDICAL HISTORY:  Past Medical History:  Diagnosis Date  . Adult failure to thrive   . Asthma   . Cellulitis of left foot   . Cellulitis of right foot   . Diabetes mellitus without complication ([place])   . Glaucoma   . HLD (hyperlipidemia)   . Hypertension   . Protein calorie malnutrition ([place])   . 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 Thyroid disease    hypothyroid  . Urinary retention     SOCIAL HX:  Social History   Tobacco Use  . Smoking status: Former Research scientist (life sciences)  . Smokeless tobacco: Never Used   Substance Use Topics  . Alcohol use: No    ALLERGIES:  Allergies  Allergen Reactions  . Penicillins Swelling    Has patient had a PCN reaction causing immediate rash, facial/tongue/throat swelling, SOB or lightheadedness with hypotension: Unknown Has patient had a PCN reaction causing severe rash involving mucus membranes or skin necrosis: Unknown Has patient had a PCN reaction that required hospitalization: Unknown Has patient had a PCN reaction occurring within the last 10 years: Unknown If all of the above answers are "NO", then may proceed with Cephalosporin use.      PERTINENT MEDICATIONS:  Outpatient Encounter Medications as of 10/20/2019  Medication Sig  . acetaminophen (TYLENOL) 325 MG tablet Take 650 mg by mouth every 6 (six) hours as needed.  . Cholecalciferol (D3-50) 50000 units capsule Take 50,000 Units by mouth once a week.  . Cyanocobalamin (B-12) 1000 MCG CAPS Take 1,000 mcg by mouth daily.  . ferrous sulfate 325 (65 FE) MG tablet Take 325 mg by mouth daily with breakfast.  . gabapentin (NEURONTIN) 100 MG capsule Take 200 mg by mouth 3 (three) times daily.  Marland Kitchen glipiZIDE (GLUCOTROL) 5 MG tablet Take by mouth daily before breakfast.  . insulin aspart (NOVOLOG) 100 UNIT/ML injection Inject into the skin 3 (three) times daily before meals.  Marland Kitchen levothyroxine (SYNTHROID, LEVOTHROID) 125 MCG tablet Take 1 tablet (125 mcg total) by mouth daily before breakfast.  . nystatin ointment (MYCOSTATIN) Apply 1 application topically  2 (two) times daily.  . silver sulfADIAZINE (SILVADENE) 1 % cream Apply 1 application topically daily.  . tamsulosin (FLOMAX) 0.4 MG CAPS capsule Take 1 capsule (0.4 mg total) by mouth daily.   No facility-administered encounter medications on file as of 10/20/2019.       Jimmye Wisnieski Jenetta Downer, NP

## 2019-10-25 ENCOUNTER — Telehealth: Payer: Self-pay

## 2019-10-25 NOTE — Telephone Encounter (Signed)
-----   Message from Nori Riis, PA-C sent at 10/25/2019  8:42 AM EST ----- Please let [name] know that her renal lasix scan shows no further decline in her kidney function.  We need to see her in one year for a renal ultrasound.

## 2019-10-25 NOTE — Telephone Encounter (Signed)
Patient aware of results and recommendations. °

## 2020-01-20 ENCOUNTER — Ambulatory Visit (INDEPENDENT_AMBULATORY_CARE_PROVIDER_SITE_OTHER): Payer: Medicare Other | Admitting: Nurse Practitioner

## 2020-02-02 ENCOUNTER — Other Ambulatory Visit: Payer: Self-pay

## 2020-02-02 ENCOUNTER — Non-Acute Institutional Stay: Payer: Medicare Other | Admitting: Adult Health Nurse Practitioner

## 2020-02-02 DIAGNOSIS — Z515 Encounter for palliative care: Secondary | ICD-10-CM

## 2020-02-02 DIAGNOSIS — I890 Lymphedema, not elsewhere classified: Secondary | ICD-10-CM

## 2020-02-02 NOTE — Progress Notes (Signed)
[facility] Consult Note Telephone: [phone]  Fax: [phone]  PATIENT NAME: [name] DOB: 1951-11-12 MRN: [id number]  PRIMARY CARE PROVIDER:   Kirk Ruths, MD  REFERRING PROVIDER:  Kirk Ruths, MD [name],  [place] [address]  RESPONSIBLE PARTY:   self    RECOMMENDATIONS and PLAN:  1.  Advanced care planning.  Patient is a DNR.    2. Lymphedema/PVD.  She has significant edema in bilateral legs.  She is being seen by vein and vascular. Denies SOB, cough, chest pain. Continue follow up and recommendations by vein and vascular.    Overall patient is doing well.  She has no new complaints today.  States her appetite is good.  She did test positive for COVID 19 in January but was asymptomatic and was put on quarantine for 10 days.  She has completed both vaccinations for COVID. No falls or hospitalization since last visit.  Palliative care will continue to monitor for symptom management/decline and make recommendations as needed.  I spent 25 minutes providing this consultation,  including time with patient/family, chart review, provider coordination, and documentation. More than 50% of the time in this consultation was spent coordinating communication.   HISTORY OF PRESENT ILLNESS:  [name] is a 69 y.o. year old female with multiple medical problems including lymphedema, PVD,hyperlipidemia, hypothyroidism, neuropathy. Palliative Care was asked to help address goals of care.   CODE STATUS: DNR  PPS: 50% HOSPICE ELIGIBILITY/DIAGNOSIS: TBD  PHYSICAL EXAM:   General: NAD, frail appearing Cardiovascular: regular rate and rhythm Pulmonary: lung sounds clear; normal respiratory effort Abdomen: soft, nontender, + bowel sounds GU: no suprapubic tenderness Extremities: 2-3+ pitting edema, patient has wraps in place on bilateral legs; no joint deformities Neurological:  Weakness but otherwise nonfocal   PAST MEDICAL HISTORY:  Past Medical History:  Diagnosis Date  . Adult failure to thrive   . Asthma   . Cellulitis of left foot   . Cellulitis of right foot   . Diabetes mellitus without complication ([place])   . Glaucoma   . HLD (hyperlipidemia)   . Hypertension   . Protein calorie malnutrition ([place])   . Stage 2 skin ulcer of sacral region ([place])   . Thyroid disease    hypothyroid  . Urinary retention     SOCIAL HX:  Social History   Tobacco Use  . Smoking status: Former Research scientist (life sciences)  . Smokeless tobacco: Never Used  Substance Use Topics  . Alcohol use: No    ALLERGIES:  Allergies  Allergen Reactions  . Penicillins Swelling    Has patient had a PCN reaction causing immediate rash, facial/tongue/throat swelling, SOB or lightheadedness with hypotension: Unknown Has patient had a PCN reaction causing severe rash involving mucus membranes or skin necrosis: Unknown Has patient had a PCN reaction that required hospitalization: Unknown Has patient had a PCN reaction occurring within the last 10 years: Unknown If all of the above answers are "NO", then may proceed with Cephalosporin use.      PERTINENT MEDICATIONS:  Outpatient Encounter Medications as of 02/02/2020  Medication Sig  . acetaminophen (TYLENOL) 325 MG tablet Take 650 mg by mouth every 6 (six) hours as needed.  . Cholecalciferol (D3-50) 50000 units capsule Take 50,000 Units by mouth once a week.  . Cyanocobalamin (B-12) 1000 MCG CAPS Take 1,000 mcg by mouth daily.  . ferrous sulfate 325 (65 FE) MG tablet Take  325 mg by mouth daily with breakfast.  . gabapentin (NEURONTIN) 100 MG capsule Take 200 mg by mouth 3 (three) times daily.  Marland Kitchen glipiZIDE (GLUCOTROL) 5 MG tablet Take by mouth daily before breakfast.  . insulin aspart (NOVOLOG) 100 UNIT/ML injection Inject into the skin 3 (three) times daily before meals.  Marland Kitchen levothyroxine (SYNTHROID, LEVOTHROID) 125 MCG tablet Take 1 tablet (125 mcg  total) by mouth daily before breakfast.  . nystatin ointment (MYCOSTATIN) Apply 1 application topically 2 (two) times daily.  . silver sulfADIAZINE (SILVADENE) 1 % cream Apply 1 application topically daily.  . tamsulosin (FLOMAX) 0.4 MG CAPS capsule Take 1 capsule (0.4 mg total) by mouth daily.   No facility-administered encounter medications on file as of 02/02/2020.    Yolanda Niznik Jenetta Downer, NP

## 2020-04-05 ENCOUNTER — Non-Acute Institutional Stay: Payer: Medicare Other | Admitting: Adult Health Nurse Practitioner

## 2020-04-05 ENCOUNTER — Other Ambulatory Visit: Payer: Self-pay

## 2020-04-05 DIAGNOSIS — I890 Lymphedema, not elsewhere classified: Secondary | ICD-10-CM

## 2020-04-05 DIAGNOSIS — Z515 Encounter for palliative care: Secondary | ICD-10-CM

## 2020-04-05 NOTE — Progress Notes (Signed)
[name] Consult Note Telephone: [phone]  Fax: [phone]  PATIENT NAME: [name] DOB: May 05, 1951 MRN: [id number]  PRIMARY CARE PROVIDER:   Kirk Ruths, MD  REFERRING PROVIDER:  Kirk Ruths, MD [place],  [place] [address]  RESPONSIBLE PARTY:   self       RECOMMENDATIONS and PLAN:  1.  Advanced care planning.  Patient is a DNR.   2.  Lymphedema/PVD. She has significant edema in bilateral legs. She is being seen by vein and vascular. Denies SOB, cough, chest pain.States that she is trying to limit her salt intake, elevate her legs throughout the day, and has been increasing her walking throughout the day.  Continue follow up and recommendations by vein and vascular.   3.  Allergies.  Patient has been having nasal congestion, runny nose, and occasional headache.  Denies fever, wheezing, cough, increased SOB, itchy or watery eyes, sinus pain or pressure.  States that she has been on flonase in the past for allergies.  Left order for Flonase 2 sprays in each nostril daily for 15 days and then PRN for her allergies.  Overall patient is doing well.  States her appetite is good.  She has not had any falls, infections, or hospital visits since last visit.  Palliative care will continue to monitor for symptom management/decline and make recommendations as needed.  Will follow up in 6-8 weeks.  I spent 40 minutes providing this consultation,  from 12:10 to 12:50 including time spent with patient/family, chart review, provider coordination, documentation. More than 50% of the time in this consultation was spent coordinating communication.   HISTORY OF PRESENT ILLNESS:  [name] is a 69 y.o. year old female with multiple medical problems including lymphedema, PVD,hyperlipidemia, hypothyroidism, neuropathy. Palliative Care was asked to help address goals of care.   CODE  STATUS: DNR  PPS: 50% HOSPICE ELIGIBILITY/DIAGNOSIS: TBD  PHYSICAL EXAM:   General: NAD, frail appearing Cardiovascular: regular rate and rhythm Pulmonary: lung sounds clear; normal respiratory effort Abdomen: soft, nontender, + bowel sounds GU: no suprapubic tenderness Extremities:2-3+ pitting edema,patient has wraps in place on bilateral legs;no joint deformities Neurological: Weakness but otherwise nonfocal  PAST MEDICAL HISTORY:  Past Medical History:  Diagnosis Date  . Adult failure to thrive   . Asthma   . Cellulitis of left foot   . Cellulitis of right foot   . Diabetes mellitus without complication ([place])   . Glaucoma   . HLD (hyperlipidemia)   . Hypertension   . Protein calorie malnutrition ([place])   . 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 Thyroid disease    hypothyroid  . Urinary retention     SOCIAL HX:  Social History   Tobacco Use  . Smoking status: Former Research scientist (life sciences)  . Smokeless tobacco: Never Used  Substance Use Topics  . Alcohol use: No    ALLERGIES:  Allergies  Allergen Reactions  . Penicillins Swelling    Has patient had a PCN reaction causing immediate rash, facial/tongue/throat swelling, SOB or lightheadedness with hypotension: Unknown Has patient had a PCN reaction causing severe rash involving mucus membranes or skin necrosis: Unknown Has patient had a PCN reaction that required hospitalization: Unknown Has patient had a PCN reaction occurring within the last 10 years: Unknown If all of the above answers are "NO", then may proceed with Cephalosporin use.      PERTINENT  MEDICATIONS:  Outpatient Encounter Medications as of 04/05/2020  Medication Sig  . acetaminophen (TYLENOL) 325 MG tablet Take 650 mg by mouth every 6 (six) hours as needed.  . Cholecalciferol (D3-50) 50000 units capsule Take 50,000 Units by mouth once a week.  . Cyanocobalamin (B-12) 1000 MCG CAPS Take 1,000 mcg by mouth daily.  . ferrous sulfate 325 (65 FE) MG tablet  Take 325 mg by mouth daily with breakfast.  . gabapentin (NEURONTIN) 100 MG capsule Take 200 mg by mouth 3 (three) times daily.  Marland Kitchen glipiZIDE (GLUCOTROL) 5 MG tablet Take by mouth daily before breakfast.  . insulin aspart (NOVOLOG) 100 UNIT/ML injection Inject into the skin 3 (three) times daily before meals.  Marland Kitchen levothyroxine (SYNTHROID, LEVOTHROID) 125 MCG tablet Take 1 tablet (125 mcg total) by mouth daily before breakfast.  . nystatin ointment (MYCOSTATIN) Apply 1 application topically 2 (two) times daily.  . silver sulfADIAZINE (SILVADENE) 1 % cream Apply 1 application topically daily.  . tamsulosin (FLOMAX) 0.4 MG CAPS capsule Take 1 capsule (0.4 mg total) by mouth daily.   No facility-administered encounter medications on file as of 04/05/2020.    PHYSICAL EXAM:   General: NAD, frail appearing, thin Cardiovascular: regular rate and rhythm Pulmonary: clear ant fields Abdomen: soft, nontender, + bowel sounds GU: no suprapubic tenderness Extremities: no edema, no joint deformities Skin: no rashes Neurological: Weakness but otherwise nonfocal  [place] Jenetta Downer, NP

## 2020-05-17 ENCOUNTER — Non-Acute Institutional Stay: Payer: Medicare Other | Admitting: Adult Health Nurse Practitioner

## 2020-05-17 ENCOUNTER — Other Ambulatory Visit: Payer: Self-pay

## 2020-05-17 DIAGNOSIS — I890 Lymphedema, not elsewhere classified: Secondary | ICD-10-CM

## 2020-05-17 DIAGNOSIS — Z515 Encounter for palliative care: Secondary | ICD-10-CM

## 2020-05-17 NOTE — Progress Notes (Signed)
[facility] Consult Note Telephone: [phone]  Fax: [phone]  PATIENT NAME: [name] DOB: [birth year] MRN: [id number]  PRIMARY CARE PROVIDER:   Kirk Ruths, MD  REFERRING PROVIDER:  Kirk Ruths, MD [place],  [place] [address]  RESPONSIBLE PARTY:   self    RECOMMENDATIONS and PLAN:  1.  Advanced care planning. Patient is a DNR.   2.  Lymphedema/PVD. She has significant edema in bilateral legs. She is being seen by vein and vascular.Denies SOB, cough, chest pain.States that she does get more edema when she eats salt and continues to lower her salt intake.  she elevates her legs throughout the day, and has been increasing her walking throughout the day.  Patient states that she is trying to lose weight and states trying to stop eating for the day at around 7pm.  Continue follow up and recommendations by vein and vascular.   Overall patient is doing well. She has not had any falls, infections, or hospital visits since last visit.  Palliative care will continue to monitor for symptom management/decline and make recommendations as needed.  Will follow up in 6-8 weeks.  I spent 30 minutes providing this consultation,  from 10:30 to 11:00 including time spent with patient/family, chart review, provider coordination, documentation. More than 50% of the time in this consultation was spent coordinating communication.   HISTORY OF PRESENT ILLNESS:  [name] is a 69 y.o. year old female with multiple medical problems including lymphedema, PVD,hyperlipidemia, hypothyroidism, neuropathy. Palliative Care was asked to help address goals of care.   CODE STATUS: DNR  PPS: 50% HOSPICE ELIGIBILITY/DIAGNOSIS: TBD  PHYSICAL EXAM:  HR 79  O2 99% on RA General: NAD, frail appearing Cardiovascular: regular rate and rhythm Pulmonary:lung sounds clear; normal respiratory effort  Abdomen: soft, nontender, + bowel sounds GU: no suprapubic tenderness Extremities:2-3+ pitting edema,patienthas wraps in place on bilateral legs;no joint deformities Neurological: Weakness but otherwise nonfocal  PAST MEDICAL HISTORY:  Past Medical History:  Diagnosis Date  . Adult failure to thrive   . Asthma   . Cellulitis of left foot   . Cellulitis of right foot   . Diabetes mellitus without complication ([place])   . Glaucoma   . HLD (hyperlipidemia)   . Hypertension   . Protein calorie malnutrition ([place])   . Stage 2 skin ulcer of sacral region ([place])   . Thyroid disease    hypothyroid  . Urinary retention     SOCIAL HX:  Social History   Tobacco Use  . Smoking status: Former Research scientist (life sciences)  . Smokeless tobacco: Never Used  Substance Use Topics  . Alcohol use: No    ALLERGIES:  Allergies  Allergen Reactions  . Penicillins Swelling    Has patient had a PCN reaction causing immediate rash, facial/tongue/throat swelling, SOB or lightheadedness with hypotension: Unknown Has patient had a PCN reaction causing severe rash involving mucus membranes or skin necrosis: Unknown Has patient had a PCN reaction that required hospitalization: Unknown Has patient had a PCN reaction occurring within the last 10 years: Unknown If all of the above answers are "NO", then may proceed with Cephalosporin use.      PERTINENT MEDICATIONS:  Outpatient Encounter Medications as of 05/17/2020  Medication Sig  . acetaminophen (TYLENOL) 325 MG tablet Take 650 mg by mouth every 6 (six) hours as needed.  . Cholecalciferol (D3-50) 50000 units capsule Take 50,000 Units by  mouth once a week.  . Cyanocobalamin (B-12) 1000 MCG CAPS Take 1,000 mcg by mouth daily.  . ferrous sulfate 325 (65 FE) MG tablet Take 325 mg by mouth daily with breakfast.  . gabapentin (NEURONTIN) 100 MG capsule Take 200 mg by mouth 3 (three) times daily.  Marland Kitchen glipiZIDE (GLUCOTROL) 5 MG tablet Take by mouth daily before breakfast.  .  insulin aspart (NOVOLOG) 100 UNIT/ML injection Inject into the skin 3 (three) times daily before meals.  Marland Kitchen levothyroxine (SYNTHROID, LEVOTHROID) 125 MCG tablet Take 1 tablet (125 mcg total) by mouth daily before breakfast.  . nystatin ointment (MYCOSTATIN) Apply 1 application topically 2 (two) times daily.  . silver sulfADIAZINE (SILVADENE) 1 % cream Apply 1 application topically daily.  . tamsulosin (FLOMAX) 0.4 MG CAPS capsule Take 1 capsule (0.4 mg total) by mouth daily.   No facility-administered encounter medications on file as of 05/17/2020.    Amy Jenetta Downer, NP

## 2020-06-22 DIAGNOSIS — C541 Malignant neoplasm of endometrium: Secondary | ICD-10-CM | POA: Insufficient documentation

## 2020-06-23 ENCOUNTER — Other Ambulatory Visit: Payer: Self-pay | Admitting: Obstetrics and Gynecology

## 2020-06-23 DIAGNOSIS — C541 Malignant neoplasm of endometrium: Secondary | ICD-10-CM

## 2020-06-28 ENCOUNTER — Other Ambulatory Visit: Payer: Self-pay

## 2020-06-28 ENCOUNTER — Inpatient Hospital Stay: Payer: Medicare Other

## 2020-06-28 ENCOUNTER — Inpatient Hospital Stay: Payer: Medicare Other | Attending: Obstetrics and Gynecology | Admitting: Obstetrics and Gynecology

## 2020-06-28 VITALS — BP 123/60 | HR 71 | Temp 96.8°F | Ht 64.0 in | Wt 225.0 lb

## 2020-06-28 DIAGNOSIS — Z79899 Other long term (current) drug therapy: Secondary | ICD-10-CM | POA: Diagnosis not present

## 2020-06-28 DIAGNOSIS — E785 Hyperlipidemia, unspecified: Secondary | ICD-10-CM | POA: Insufficient documentation

## 2020-06-28 DIAGNOSIS — C541 Malignant neoplasm of endometrium: Secondary | ICD-10-CM | POA: Diagnosis present

## 2020-06-28 DIAGNOSIS — E119 Type 2 diabetes mellitus without complications: Secondary | ICD-10-CM | POA: Diagnosis not present

## 2020-06-28 DIAGNOSIS — N179 Acute kidney failure, unspecified: Secondary | ICD-10-CM | POA: Diagnosis not present

## 2020-06-28 DIAGNOSIS — Z87891 Personal history of nicotine dependence: Secondary | ICD-10-CM | POA: Insufficient documentation

## 2020-06-28 DIAGNOSIS — Z794 Long term (current) use of insulin: Secondary | ICD-10-CM | POA: Diagnosis not present

## 2020-06-28 DIAGNOSIS — J45909 Unspecified asthma, uncomplicated: Secondary | ICD-10-CM | POA: Diagnosis not present

## 2020-06-28 DIAGNOSIS — I872 Venous insufficiency (chronic) (peripheral): Secondary | ICD-10-CM | POA: Diagnosis not present

## 2020-06-28 DIAGNOSIS — N950 Postmenopausal bleeding: Secondary | ICD-10-CM | POA: Diagnosis not present

## 2020-06-28 DIAGNOSIS — Z01818 Encounter for other preprocedural examination: Secondary | ICD-10-CM

## 2020-06-28 DIAGNOSIS — E039 Hypothyroidism, unspecified: Secondary | ICD-10-CM | POA: Insufficient documentation

## 2020-06-28 DIAGNOSIS — I10 Essential (primary) hypertension: Secondary | ICD-10-CM | POA: Diagnosis not present

## 2020-06-28 DIAGNOSIS — I890 Lymphedema, not elsewhere classified: Secondary | ICD-10-CM | POA: Insufficient documentation

## 2020-06-28 DIAGNOSIS — Z01810 Encounter for preprocedural cardiovascular examination: Secondary | ICD-10-CM

## 2020-06-28 DIAGNOSIS — E441 Mild protein-calorie malnutrition: Secondary | ICD-10-CM | POA: Diagnosis not present

## 2020-06-28 LAB — CBC WITH DIFFERENTIAL/PLATELET
Abs Immature Granulocytes: 0.03 10*3/uL (ref 0.00–0.07)
Basophils Absolute: 0.1 10*3/uL (ref 0.0–0.1)
Basophils Relative: 1 %
Eosinophils Absolute: 0.2 10*3/uL (ref 0.0–0.5)
Eosinophils Relative: 2 %
HCT: 36.1 % (ref 36.0–46.0)
Hemoglobin: 11.9 g/dL — ABNORMAL LOW (ref 12.0–15.0)
Immature Granulocytes: 0 %
Lymphocytes Relative: 41 %
Lymphs Abs: 4 10*3/uL (ref 0.7–4.0)
MCH: 29.2 pg (ref 26.0–34.0)
MCHC: 33 g/dL (ref 30.0–36.0)
MCV: 88.5 fL (ref 80.0–100.0)
Monocytes Absolute: 0.6 10*3/uL (ref 0.1–1.0)
Monocytes Relative: 6 %
Neutro Abs: 4.9 10*3/uL (ref 1.7–7.7)
Neutrophils Relative %: 50 %
Platelets: 254 10*3/uL (ref 150–400)
RBC: 4.08 MIL/uL (ref 3.87–5.11)
RDW: 14.6 % (ref 11.5–15.5)
WBC: 9.8 10*3/uL (ref 4.0–10.5)
nRBC: 0 % (ref 0.0–0.2)

## 2020-06-28 LAB — COMPREHENSIVE METABOLIC PANEL
ALT: 21 U/L (ref 0–44)
AST: 26 U/L (ref 15–41)
Albumin: 3.7 g/dL (ref 3.5–5.0)
Alkaline Phosphatase: 111 U/L (ref 38–126)
Anion gap: 10 (ref 5–15)
BUN: 33 mg/dL — ABNORMAL HIGH (ref 8–23)
CO2: 26 mmol/L (ref 22–32)
Calcium: 9.2 mg/dL (ref 8.9–10.3)
Chloride: 106 mmol/L (ref 98–111)
Creatinine, Ser: 1.27 mg/dL — ABNORMAL HIGH (ref 0.44–1.00)
GFR calc Af Amer: 50 mL/min — ABNORMAL LOW (ref >60)
GFR calc non Af Amer: 43 mL/min — ABNORMAL LOW (ref >60)
Glucose, Bld: 72 mg/dL (ref 70–99)
Potassium: 3.9 mmol/L (ref 3.5–5.1)
Sodium: 142 mmol/L (ref 135–145)
Total Bilirubin: 0.5 mg/dL (ref 0.3–1.2)
Total Protein: 8.5 g/dL — ABNORMAL HIGH (ref 6.5–8.1)

## 2020-06-28 LAB — HEMOGLOBIN A1C
Hgb A1c MFr Bld: 8.1 % — ABNORMAL HIGH (ref 4.8–5.6)
Mean Plasma Glucose: 185.77 mg/dL

## 2020-06-28 LAB — LIPID PANEL
Cholesterol: 232 mg/dL — ABNORMAL HIGH (ref 0–200)
HDL: 58 mg/dL (ref >40)
LDL Cholesterol: 138 mg/dL — ABNORMAL HIGH (ref 0–99)
Total CHOL/HDL Ratio: 4 RATIO
Triglycerides: 179 mg/dL — ABNORMAL HIGH (ref <150)
VLDL: 36 mg/dL (ref 0–40)

## 2020-06-28 NOTE — Patient Instructions (Signed)
Uterine Cancer  Uterine cancer is an abnormal growth of cancer tissue (malignant tumor) in the uterus. Unlike noncancerous (benign) tumors, malignant tumors can spread to other parts of the body. Uterine cancer usually occurs after menopause. However, it may also occur around the time that menopause begins. The wall of the uterus has an inner layer of tissue (endometrium) and an outer layer of muscle tissue (myometrium). The most common type of uterine cancer begins in the endometrium (endometrial cancer). Cancer that begins in the myometrium (uterine sarcoma) is very rare. What are the causes? The exact cause of this condition is not known. What increases the risk? You are more likely to develop this condition if you:  Are older than 50.  Have an enlarged endometrium (endometrial hyperplasia).  Use hormone therapy.  Are severely overweight (obese).  Use the medicine tamoxifen.  You are white (Caucasian).  Cannot bear children (are infertile).  Have never been pregnant.  Started menstruating at an age younger than 12 years.  Are older than 52 and are still having menstrual periods.  Have a history of cancer of the ovaries, intestines, or colon or rectum (colorectal cancer).  Have a history of enlarged ovaries with small cysts (polycystic ovarian syndrome).  Have a family history of: ? Uterine cancer. ? Hereditary nonpolyposis colon cancer (HNPCC).  Have diabetes, high blood pressure, thyroid disease, or gallbladder disease.  Use long-term, high-dose birth control pills.  Have been exposed to radiation.  Smoke. What are the signs or symptoms? Symptoms of this condition include:  Abnormal vaginal bleeding or discharge. Bleeding may start as a watery, blood-streaked flow that gradually contains more blood. This is the most common symptom. If you experience abnormal vaginal bleeding, do not assume that it is part of menopause.  Vaginal bleeding after  menopause.  Unexplained weight loss.  Bleeding between periods.  Urination that is difficult, painful, or more frequent than usual.  A lump (mass) in the vagina.  Pain, bloating, or fullness in the abdomen.  Pain in the pelvic area.  Pain during sex. How is this diagnosed? This condition may be diagnosed based on:  Your medical history and your symptoms.  A physical and pelvic exam. Your health care provider will feel your pelvis for any growths or enlarged lymph nodes.  Blood and urine tests.  Imaging tests, such as X-rays, CT scans, ultrasound, or MRIs.  A procedure in which a thin, flexible tube with a light and camera on the end is inserted through the vagina and used to look inside the uterus (hysteroscopy).  A Pap test to check for abnormal cells in the lower part of the uterus (cervix) and the upper vagina.  Removing a tissue sample (biopsy) from the uterine lining to check for cancer cells.  Dilation and curettage (D&C). This is a procedure that involves stretching (dilation) the cervix and scraping (curettage) the inside lining of the uterus to get a biopsy and check for cancer cells. Your cancer will be staged to determine its severity and extent. Staging is an assessment of:  The size of the tumor.  Whether the cancer has spread.  Where the cancer has spread. The stages of uterine cancer are as follows:  Stage I. The cancer is only found in the uterus.  Stage II. The cancer has spread to the cervix.  Stage III. The cancer has spread outside the uterus, but not outside the pelvis. The cancer may have spread to the lymph nodes in the pelvis. Lymph nodes are   part of your body's disease-fighting (immune) system. Lymph nodes are found in many locations in your body, including the neck, underarm, and groin.  Stage IV. The cancer has spread to other parts of the body, such as the bladder or rectum. How is this treated? This condition is often treated with surgery  to remove:  The uterus, cervix, fallopian tubes, and ovaries (total hysterectomy).  The uterus and cervix (simple hysterectomy). The type of hysterectomy you will have depends on the extent of your cancer. Lymph nodes near the uterus may also be removed in some cases. Treatment may also include one or more of the following:  Chemotherapy. This uses medicines to kill the cancer cells and prevent their spread.  Radiation therapy. This uses high-energy rays to kill the cancer cells and prevent the spread of cancer.  Chemoradiation. This is a combination treatment that alternates chemotherapy with radiation treatments to enhance the way radiation works.  Brachytherapy. This involves placing radioactive materials inside the body where the cancer was removed.  Hormone therapy. This includes taking medicines that lower the levels of estrogen in the body. Follow these instructions at home: Activity  Return to your normal activities as told by your health care provider. Ask your health care provider what activities are safe for you.  Exercise regularly as told by your health care provider.  Do not drive or use heavy machinery while taking prescription pain medicine. General instructions  Take over-the-counter and prescription medicines only as told by your health care provider.  Maintain a healthy diet.  Work with your health care provider to: ? Manage any long-term (chronic) conditions you have, such as diabetes, high blood pressure, thyroid disease, or gallbladder disease. ? Manage any side effects of your treatment.  Do not use any products that contain nicotine or tobacco, such as cigarettes and e-cigarettes. If you need help quitting, ask your health care provider.  Consider joining a support group to help you cope with stress. Your health care provider may be able to recommend a local or online support group.  Keep all follow-up visits as told by your health care provider. This is  important. Where to find more information  American Cancer Society: https://www.cancer.org  National Cancer Institute (NCI): https://www.cancer.gov Contact a health care provider if:  You have pain in your pelvis or abdomen that gets worse.  You cannot urinate.  You have abnormal bleeding.  You have a fever. Get help right away if:  You develop sudden or new severe symptoms, such as: ? Heavy bleeding. ? Severe weakness. ? Pain that is severe or does not get better with medicine. Summary  Uterine cancer is an abnormal growth of cancer tissue (malignant tumor) in the uterus. The most common type of uterine cancer begins in the endometrium (endometrial cancer).  This condition is often treated with surgery to remove the uterus, cervix, fallopian tubes, and ovaries (total hysterectomy) or the uterus and cervix (simple hysterectomy).  Work with your health care provider to manage any long-term (chronic) conditions you have, such as diabetes, high blood pressure, thyroid disease, or gallbladder disease.  Consider joining a support group to help you cope with stress. Your health care provider may be able to recommend a local or online support group. This information is not intended to replace advice given to you by your health care provider. Make sure you discuss any questions you have with your health care provider. Document Revised: 11/21/2017 Document Reviewed: 12/06/2016 Elsevier Patient Education  2020 Elsevier Inc.  

## 2020-06-28 NOTE — Progress Notes (Signed)
Patient here today or new evaluation regarding malignant neoplasm of endometrium.

## 2020-06-28 NOTE — Progress Notes (Signed)
Gynecologic Oncology Consult Visit   Referring Provider: Boykin Nearing, MD [address] [facility] [place],  [place] [address] [phone]  Chief Concern: endometrial cancer  Subjective:  [name] is a 69 y.o. G0P0 female who is seen in consultation from Dr. Ouida Sills for carcinosarcoma of the uterus.   She presented with postmenopausal bleeding which started in April. She has continued to have bleeding which continues every other day. Evaluation included the following:  06/15/2020 EMBX : CARCINOSARCOMA (MALIGNANT MIXED MULLERIAN TUMOR). Uterus sounded to 9 cm.   Pap: Pap IG (Image Guided) -  Comment: EPITHELIAL CELL ABNORMALITY.  ATYPICAL GLANDULAR CELLS (AGC).   CT abdomen/pelvis scan has been ordered by Dr. Ouida Sills and is scheduled 06/29/2020.   Prior CT scan 09/22/2019 IMPRESSION: 1. 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 Left sided hydronephrosis is seen to the level of the ureteropelvic junction, as on 07/08/2018.  2. Right renal stone. 3. Advanced left hip osteoarthritis. 4.  Aortic atherosclerosis (ICD10-170.0).  She presents today to review treatment options. She has multiple medical issues including HTN, malnutrition, type 2 DM (with h/o AKI and diabetic infections), aortic atherosclerosis, chronic venous insufficiency with severe lymphedema, and hyperlipidemia. She has had a prior ECHO 01/24/2017 which revealed normal function with mild LVH.   INTERPRETATION ECHO NORMAL LEFT VENTRICULAR SYSTOLIC FUNCTION WITH MILD LVH  NORMAL RIGHT VENTRICULAR SYSTOLIC FUNCTION  MILD VALVULAR REGURGITATION (See above)  NO VALVULAR STENOSIS  EF 50-55%  Closest EF: >55% (Estimated)  LVH: MILDLVH  Mitral: MILD MR   She lives in [facility] facility. [name], one of the facility aids came to the appointment with her today.     Problem List: Patient Active Problem List   Diagnosis Date Noted  .  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 Palliative care encounter 01/06/2019  . Localized edema 01/06/2019  . Shortness of breath 01/06/2019  . Hypertension 08/25/2018  . Cellulitis of lower extremity 04/06/2018  . Mild protein-calorie malnutrition ([place]) 04/06/2018  . Pressure injury of skin 03/27/2018  . Sepsis ([place]) 03/26/2018  . Diabetic infection of left foot ([place]) 03/26/2018  . AKI (acute kidney injury) ([place]) 03/26/2018  . Venous ulcer of left leg ([place]) 11/18/2016  . Chronic venous insufficiency 11/18/2016  . Lymphedema 11/18/2016  . Swelling of limb 11/18/2016  . Type 2 diabetes mellitus with insulin therapy ([place]) 11/18/2016  . Hyperlipidemia 11/18/2016    Past Medical History: Past Medical History:  Diagnosis Date  . Adult failure to thrive   . Aortic atherosclerosis ([place])   . Asthma   . 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 Cellulitis of left foot   . Cellulitis of right foot   . Diabetes mellitus without complication ([place])   . Glaucoma   . HLD (hyperlipidemia)   . Hypertension   . Protein calorie malnutrition ([place])   . Stage 2 skin ulcer of sacral region ([place])   . Thyroid disease    hypothyroid  . Urinary retention     Past Surgical History: Past Surgical History:  Procedure Laterality Date  . APPENDECTOMY    . LOWER EXTREMITY ANGIOGRAPHY Left 01/04/2019   Procedure: LOWER EXTREMITY ANGIOGRAPHY;  Surgeon: Algernon Huxley, MD;  Location: [facility];  Service: Cardiovascular;  Laterality: Left;    Past Gynecologic History:  Menarche: unknown, never had a period per her report Menstrual details: as above History of OCP/HRT use: no History of Abnormal pap: as per HPI  Last pap: as per HPI History of STDs: The patient denies history of sexually transmitted disease.  OB History: G0P0 OB History  No obstetric history on file.    Family History: Family History  Problem Relation Age of Onset  . Diabetes Mother   . Diabetes Father   .  Alzheimer's disease Father   . Breast cancer Neg Hx     Social History: Social History   Socioeconomic History  . Marital status: Single    Spouse name: Not on file  . Number of children: Not on file  . Years of education: Not on file  . Highest education level: Not on file  Occupational History  . 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 Smoking status: Former Research scientist (life sciences)  . Smokeless tobacco: Never Used  Vaping Use  . Vaping Use: Never used  Substance and Sexual Activity  . Alcohol use: No  . Drug use: No  . Sexual activity: Not on file  Other Topics Concern  . Not on file  Social History Narrative  . Not on file   Social Determinants of Health   Financial Resource Strain:   . Difficulty of Paying Living Expenses:   Food Insecurity:   . Worried About Charity fundraiser in the Last Year:   . Arboriculturist in the Last Year:   Transportation Needs:   . Film/video editor (Medical):   Marland Kitchen Lack of Transportation (Non-Medical):   Physical Activity:   . Days of Exercise per Week:   . Minutes of Exercise per Session:   Stress:   . Feeling of Stress :   Social Connections:   . Frequency of Communication with Friends and Family:   . Frequency of Social Gatherings with Friends and Family:   . Attends Religious Services:   . Active Member of Clubs or Organizations:   . Attends Archivist Meetings:   Marland Kitchen Marital Status:   Intimate Partner Violence:   . Fear of Current or Ex-Partner:   . Emotionally Abused:   Marland Kitchen Physically Abused:   . Sexually Abused:     Allergies: Allergies  Allergen Reactions  . Penicillins Swelling    Has patient had a PCN reaction causing immediate rash, facial/tongue/throat swelling, SOB or lightheadedness with hypotension: Unknown Has patient had a PCN reaction causing severe rash involving mucus membranes or skin necrosis: Unknown Has patient had a PCN reaction that required hospitalization:  Unknown Has patient had a PCN reaction occurring within the last 10 years: Unknown If all of the above answers are "NO", then may proceed with Cephalosporin use.     Current Medications: Current Outpatient Medications  Medication Sig Dispense Refill  . acetaminophen (TYLENOL) 325 MG tablet Take 650 mg by mouth every 6 (six) hours as needed.    . Cholecalciferol (D3-50) 50000 units capsule Take 50,000 Units by mouth once a week.    . Cyanocobalamin (B-12) 1000 MCG CAPS Take 1,000 mcg by mouth daily. 90 capsule 1  . ferrous sulfate 325 (65 FE) MG tablet Take 325 mg by mouth daily with breakfast.    . fluticasone (FLONASE) 50 MCG/ACT nasal spray Place 1 spray into both nostrils daily.    Marland Kitchen gabapentin (NEURONTIN) 100 MG capsule Take 300 mg by mouth daily.     Marland Kitchen glipiZIDE (GLUCOTROL) 5 MG tablet Take by mouth daily before breakfast.    . insulin aspart (NOVOLOG) 100 UNIT/ML injection Inject into  the skin 3 (three) times daily before meals.    Marland Kitchen levothyroxine (SYNTHROID, LEVOTHROID) 125 MCG tablet Take 1 tablet (125 mcg total) by mouth daily before breakfast. 30 tablet 0  . tamsulosin (FLOMAX) 0.4 MG CAPS capsule Take 1 capsule (0.4 mg total) by mouth daily. 30 capsule 0   No current facility-administered medications for this visit.    Review of Systems General: negative for fevers, changes in weight or night sweats Skin: negative for changes in moles or sores or rash Eyes: negative for changes in vision HEENT: negative for change in hearing, tinnitus, voice changes Pulmonary: negative for dyspnea, orthopnea, productive cough, wheezing Cardiac: negative for palpitations, pain Gastrointestinal: negative for nausea, vomiting, constipation, diarrhea, hematemesis, hematochezia Genitourinary/Sexual: urinary frequency which is chronic voids 1-1.5 hours; negative for dysuria, retention, hematuria, incontinence Ob/Gyn:  abnormal bleeding, negative for pain Musculoskeletal: negative for pain, joint  pain, back pain Hematology: negative for easy bruising, abnormal bleeding Neurologic/Psych: negative for headaches, seizures, paralysis, weakness, numbness  Objective:  Physical Examination:  BP 123/60   Pulse 71   Temp (!) 96.8 F (36 C) (Tympanic)   Ht 5\' 4"  (1.626 m)   Wt 225 lb (102.1 kg)   LMP  (LMP Unknown)   BMI 38.62 kg/m    ECOG Performance Status: 2 - Symptomatic, <50% confined to bed  General appearance: alert and cooperative; appears older than stated age. Presents to clinic in a 4-point rollater accompanied by Aid from the [facility] [place] [facility], extra ocular movement intact and sclera clear, anicteric Lymph node survey: non-palpable, axillary, inguinal, supraclavicular Cardiovascular: regular rate and rhythm Respiratory: normal air entry, lungs clear to auscultation Abdomen: soft, non-tender, without masses or organomegaly, distended, no hernias and well healed incision Back: inspection of back is normal; no CVAT Extremities: bilateral edema with large lower lower extremities. She is wearing stockings.  Neurological exam reveals alert, oriented, normal speech, no focal findings  Pelvic: exam chaperoned by nurse;  Vulva: normal appearing vulva with no masses, tenderness or lesions; Vagina: normal vagina; narrow caliber; Adnexa:  nontender and no masses but limited by habitus; Uterus: not enlarged but limited by habitus ; Cervix: no lesions nulliparous; Rectal: not indicated   Lab Review Labs on site today: Pending  Radiologic Imaging: CXR and CT scan pending    Assessment:  [name] is a 69 y.o. G0P0 female diagnosed with carcinosarcoma endometrial cancer.   Medical co-morbidities complicating care: including HTN, malnutrition, type 2 DM (with h/o AKI and diabetic infections), chronic venous insufficiency with severe lymphedema, and hyperlipidemia. She has had a prior ECHO 01/24/2017 which revealed normal function with mild LVH.  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[place]) - Primary   Relevant Orders   Hemoglobin A1c   CBC with Differential   Comprehensive metabolic panel   Lipid panel   DG Chest 2 View   EKG 12-Lead    Other Visit Diagnoses    Pre-operative cardiovascular examination       Relevant Orders   EKG 12-Lead   Preop testing       Relevant Orders   Hemoglobin A1c   CBC with Differential   Comprehensive metabolic panel   Lipid panel   DG Chest 2 View   EKG 12-Lead   Diabetes mellitus without complication (HCC)       Relevant Orders   Hemoglobin A1c   Postmenopausal bleeding  We discussed options for management including surgery versus radiation which will depend on the findings of her CT scan, preoperative labs, EKG, and preop anesthesia assessment. She may be a candidate for surgery but further evaluation is needed. CT scan A/P and CXR to assess for metastatic disease. HTN appears to be well controlled. Assess albumin given h/o malnutrition, HbA1c to assess diabetic control, and lipid panel; EKG for baseline. She will need to see [facility] and potentially require additional cardiac workup (last ECHO 2018). She does not have chest pain but is minimally active. She is tentatively scheduled for surgery at [facility] with Dr. Unknown Foley on 07/11/2020.   Based on ACS Surgical Risk Calculator her risk of any complication ranges from 15-20% using mild to severe systemic illness. She is also at risk for SSI, UTI, readmission, and discharge to SNF. She is already at [facility].  We discussed TLH, BSO with SLN mapping and biopsy. She already has chronic lymphedema and she is aware nodal resection may increase the swelling. We also discussed that she may need a minilaparotomy given the narrowness of the vaginal vault for specimen removal. She is aware that postoperative radiation and chemotherapy may be recommended based on her pathology results  and that carcinomasarcomas tend to be a more aggressive tumor subtype.   Suggested return to clinic pending evaluation. If she has surgery we can see her in clinic ~4 weeks after surgery.   The patient's diagnosis, an outline of the further diagnostic and laboratory studies which will be required, the recommendation, and alternatives were discussed.  All questions were answered to the patient's satisfaction.  A total of 80 minutes were spent with the patient/family today; >50% was spent in education, counseling and coordination of care for endometrial cancer.    [place], MD    CC:  Schermerhorn, Gwen Her, MD [address] [facility] [place],  [place] [address] [phone]

## 2020-06-29 ENCOUNTER — Ambulatory Visit
Admission: RE | Admit: 2020-06-29 | Discharge: 2020-06-29 | Disposition: A | Discharge status: Home / Self Care | Payer: Medicare Other | Source: Ambulatory Visit | Attending: Obstetrics and Gynecology | Admitting: Obstetrics and Gynecology

## 2020-06-29 ENCOUNTER — Other Ambulatory Visit: Payer: Self-pay

## 2020-06-29 ENCOUNTER — Encounter: Payer: Self-pay | Admitting: Obstetrics and Gynecology

## 2020-06-29 ENCOUNTER — Ambulatory Visit: Admission: RE | Admit: 2020-06-29 | Payer: Medicare Other | Source: Ambulatory Visit

## 2020-06-29 ENCOUNTER — Ambulatory Visit
Admission: RE | Admit: 2020-06-29 | Discharge: 2020-06-29 | Disposition: A | Discharge status: Home / Self Care | Payer: Medicare Other | Source: Home / Self Care | Attending: Obstetrics and Gynecology | Admitting: Obstetrics and Gynecology

## 2020-06-29 DIAGNOSIS — C541 Malignant neoplasm of endometrium: Secondary | ICD-10-CM

## 2020-06-29 DIAGNOSIS — Z01818 Encounter for other preprocedural examination: Secondary | ICD-10-CM | POA: Insufficient documentation

## 2020-06-29 MED ORDER — IOHEXOL 300 MG/ML  SOLN
100.0000 mL | Freq: Once | INTRAMUSCULAR | Status: AC | PRN
  Administered 2020-06-29: 100 mL via INTRAVENOUS

## 2020-06-30 ENCOUNTER — Non-Acute Institutional Stay: Payer: Medicare Other | Admitting: Adult Health Nurse Practitioner

## 2020-06-30 DIAGNOSIS — C541 Malignant neoplasm of endometrium: Secondary | ICD-10-CM

## 2020-06-30 DIAGNOSIS — Z515 Encounter for palliative care: Secondary | ICD-10-CM

## 2020-06-30 NOTE — Progress Notes (Signed)
[facility] Consult Note Telephone: [phone]  Fax: [phone]  PATIENT NAME: [name] DOB: 1951-09-25 MRN: [id number]  PRIMARY CARE PROVIDER:   Dorthula Matas NP, [facility]  REFERRING PROVIDER:  Kirk Ruths, MD [address],  [place] [address]  RESPONSIBLE PARTY:   self       RECOMMENDATIONS and PLAN:  1.  Advanced care planning. Patient is a DNR.  2.  Carcinosarcoma of endometrium.  Patient was newly diagnosed earlier this month with carcinosarcoma of endometrium after presenting with postmenopausal bleeding.  Patient is still undergoing testing to determine staging of the cancer.  She does state that she gets some moderate amount of bleeding off and on noted in her pad.  Denies any pain.  She is on sure exactly what she wants to do.  Surgery, radiation, and chemo have all been presented as possible treatment options.  She has been seen by GYN/ONC doctor at [facility] but states that if she has to go in for chemo treatments would prefer to go somewhere close close either at [facility] or in [place].  Patient does state that this news has caused some anxiety and still the uncertainty of staging of it makes her anxious.  She tries to stay in good spirits and states that she just takes it day by day.  Continue follow-up with oncology.  Have encouraged her to call me if she wants to discuss options and for support.  3.  Lymphedema/PVD. She has significant edema in bilateral legs. She is being seen by vein and vascular.Denies SOB, cough, chest pain, orthopnea.States that she does get more edema when she eats salt and continues to lower her salt intake.  She elevates her legs throughout the day.  She sleeps in her recliner as it is easier for her to get in and out of.  Continue follow up and recommendations by vein and vascular.  She has not had any falls, infections, or hospital visits since  last visit.  Denies changes in appetite with no reported weight loss.  Denies N/V/D, constipation, headaches, dizziness. Palliative care will continue to monitor for symptom management/decline and make recommendations as needed.Will follow up in 4-6 weeks.  I spent 40 minutes providing this consultation,  from 10:50 to 11:30 including time spent with patient/family, chart review, provider coordination, documentation. More than 50% of the time in this consultation was spent coordinating communication.   HISTORY OF PRESENT ILLNESS:  [name] is a 69 y.o. year old female with multiple medical problems including lymphedema, PVD,hyperlipidemia, hypothyroidism, neuropathy. Palliative Care was asked to help address goals of care.   CODE STATUS: DNR  PPS: 50% HOSPICE ELIGIBILITY/DIAGNOSIS: TBD  PHYSICAL EXAM:  HR 75  O2 99% on RA General: NAD, frail appearing Cardiovascular: regular rate and rhythm Pulmonary:lung sounds clear; normal respiratory effort Abdomen: soft, nontender, + bowel sounds GU: no suprapubic tenderness Extremities:2-3+ pitting edema,patienthas wraps in place on bilateral legs;no joint deformities Neurological: Weakness but otherwise nonfocal  PAST MEDICAL HISTORY:  Past Medical History:  Diagnosis Date   Adult failure to thrive    Aortic atherosclerosis (HCC)    Asthma    Carcinosarcoma of endometrium ([place])    Cellulitis of left foot    Cellulitis of right foot    Diabetes mellitus without complication ([place])    Glaucoma    HLD (hyperlipidemia)    Hypertension    Protein calorie malnutrition ([place])  Stage 2 skin ulcer of sacral region [facility])    Thyroid disease    hypothyroid   Urinary retention     SOCIAL HX:  Social History   Tobacco Use   Smoking status: Former Smoker   Smokeless tobacco: Never Used  Substance Use Topics   Alcohol use: No    ALLERGIES:  Allergies  Allergen Reactions   Penicillins Swelling    Has  patient had a PCN reaction causing immediate rash, facial/tongue/throat swelling, SOB or lightheadedness with hypotension: Unknown Has patient had a PCN reaction causing severe rash involving mucus membranes or skin necrosis: Unknown Has patient had a PCN reaction that required hospitalization: Unknown Has patient had a PCN reaction occurring within the last 10 years: Unknown If all of the above answers are "NO", then may proceed with Cephalosporin use.      PERTINENT MEDICATIONS:  Outpatient Encounter Medications as of 06/30/2020  Medication Sig   acetaminophen (TYLENOL) 325 MG tablet Take 650 mg by mouth every 6 (six) hours as needed.   Cholecalciferol (D3-50) 50000 units capsule Take 50,000 Units by mouth once a week.   Cyanocobalamin (B-12) 1000 MCG CAPS Take 1,000 mcg by mouth daily.   ferrous sulfate 325 (65 FE) MG tablet Take 325 mg by mouth daily with breakfast.   fluticasone (FLONASE) 50 MCG/ACT nasal spray Place 1 spray into both nostrils daily.   gabapentin (NEURONTIN) 100 MG capsule Take 300 mg by mouth daily.    glipiZIDE (GLUCOTROL) 5 MG tablet Take by mouth daily before breakfast.   insulin aspart (NOVOLOG) 100 UNIT/ML injection Inject into the skin 3 (three) times daily before meals.   levothyroxine (SYNTHROID, LEVOTHROID) 125 MCG tablet Take 1 tablet (125 mcg total) by mouth daily before breakfast.   tamsulosin (FLOMAX) 0.4 MG CAPS capsule Take 1 capsule (0.4 mg total) by mouth daily.   No facility-administered encounter medications on file as of 06/30/2020.      Duron Meister Jenetta Downer, NP

## 2020-07-04 ENCOUNTER — Telehealth: Payer: Self-pay | Admitting: *Deleted

## 2020-07-04 NOTE — Telephone Encounter (Signed)
Do you know who the care provider requesting information is? Did they leave a number? I do not know this patient. She was seen last week in my absence and sent to [facility] for surgery.

## 2020-07-04 NOTE — Telephone Encounter (Signed)
Patient's care provider called and requested an update on patient's condition.

## 2020-07-05 ENCOUNTER — Telehealth: Payer: Self-pay

## 2020-07-05 DIAGNOSIS — C541 Malignant neoplasm of endometrium: Secondary | ICD-10-CM

## 2020-07-05 NOTE — Telephone Encounter (Signed)
Called and provided results of CXR and CT AP to [name]. Based on the CXR results, Dr. Theora Gianotti would like a chest CT to further evaluate the lung nodules. Orders placed. [name] would like [place] to provide the appointment details to the [facility]. She has given permission for [place] to talk to anyone at the [facility] regarding her health information.

## 2020-07-07 ENCOUNTER — Other Ambulatory Visit: Payer: Self-pay

## 2020-07-07 DIAGNOSIS — C541 Malignant neoplasm of endometrium: Secondary | ICD-10-CM

## 2020-07-07 DIAGNOSIS — Z01818 Encounter for other preprocedural examination: Secondary | ICD-10-CM

## 2020-07-10 ENCOUNTER — Other Ambulatory Visit: Payer: Self-pay

## 2020-07-10 ENCOUNTER — Ambulatory Visit
Admission: RE | Admit: 2020-07-10 | Discharge: 2020-07-10 | Disposition: A | Discharge status: Home / Self Care | Payer: Medicare Other | Source: Ambulatory Visit | Attending: Obstetrics and Gynecology | Admitting: Obstetrics and Gynecology

## 2020-07-10 ENCOUNTER — Other Ambulatory Visit
Admission: RE | Admit: 2020-07-10 | Discharge: 2020-07-10 | Disposition: A | Discharge status: Home / Self Care | Payer: Medicare Other | Source: Ambulatory Visit | Attending: Obstetrics and Gynecology | Admitting: Obstetrics and Gynecology

## 2020-07-10 DIAGNOSIS — C541 Malignant neoplasm of endometrium: Secondary | ICD-10-CM

## 2020-07-10 DIAGNOSIS — Z01818 Encounter for other preprocedural examination: Secondary | ICD-10-CM | POA: Diagnosis present

## 2020-07-10 LAB — CREATININE, SERUM
Creatinine, Ser: 1.19 mg/dL — ABNORMAL HIGH (ref 0.44–1.00)
GFR calc Af Amer: 54 mL/min — ABNORMAL LOW (ref >60)
GFR calc non Af Amer: 47 mL/min — ABNORMAL LOW (ref >60)

## 2020-07-10 MED ORDER — IOHEXOL 300 MG/ML  SOLN
75.0000 mL | Freq: Once | INTRAMUSCULAR | Status: AC | PRN
  Administered 2020-07-10: 75 mL via INTRAVENOUS

## 2020-07-12 ENCOUNTER — Ambulatory Visit: Payer: Medicare Other

## 2020-07-13 DIAGNOSIS — C541 Malignant neoplasm of endometrium: Secondary | ICD-10-CM | POA: Insufficient documentation

## 2020-08-09 ENCOUNTER — Inpatient Hospital Stay: Payer: Medicare Other | Attending: Obstetrics and Gynecology | Admitting: Obstetrics and Gynecology

## 2020-08-09 ENCOUNTER — Other Ambulatory Visit: Payer: Self-pay

## 2020-08-09 ENCOUNTER — Non-Acute Institutional Stay: Payer: Medicare Other | Admitting: Adult Health Nurse Practitioner

## 2020-08-09 VITALS — BP 119/72 | HR 87 | Temp 97.8°F | Resp 20 | Wt 226.8 lb

## 2020-08-09 DIAGNOSIS — C541 Malignant neoplasm of endometrium: Secondary | ICD-10-CM

## 2020-08-09 DIAGNOSIS — Z515 Encounter for palliative care: Secondary | ICD-10-CM

## 2020-08-09 DIAGNOSIS — Z90722 Acquired absence of ovaries, bilateral: Secondary | ICD-10-CM

## 2020-08-09 DIAGNOSIS — Z90710 Acquired absence of both cervix and uterus: Secondary | ICD-10-CM

## 2020-08-09 NOTE — Progress Notes (Signed)
Gynecologic Oncology Interval Visit   Referring Provider: Boykin Nearing, MD [address] [facility] [place],  [place] [address] [phone]  Chief Concern: endometrial cancer survellance  Subjective:  [name] is a 69 y.o. G0P0 female, initially seen in consultation from Dr. Ouida Sills for carcinosarcoma of the uterus.   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   Pathology A.  Left external iliac sentinel lymph node, biopsy: One lymph node, negative for malignancy (0/1), with nonnecrotizing granulomatous inflammation. Acid fast and fungal stains (Gomori methenamine silver) on block A1 are negative. B.  Right external iliac sentinel lymph node, biopsy: One lymph node, negative for malignancy (0/1), with nonnecrotizing granulomatous inflammation. C.  Uterus with bilateral ovaries and fallopian tubes, hysterectomy and bilateral salpingo-oophorectomy: Endometrioid adenocarcinoma of the endometrium with squamous differentiation (5.1 cm), FIGO grade 2 of 3, see comment. Tumor is limited to the endometrium; no myometrial invasion is seen. Remaining myometrium: No pathologic diagnosis. Cervix: Negative for malignancy. Serosa: Negative for malignancy. Left ovary and Fallopian tube: Negative for malignancy. Right Fallopian tube: Negative for malignancy. Right ovary: Not identified.  Washing negative.  Mismatch repair: INTACT A. Immunohistochemistry: Normal result. Expression of MLH1, MSH2, MSH6, and PMS2 is retained in the neoplastic cells. B. PCR: Microsatellite stable (MSS).  Case presented at Tumor Board on 07/31/20 at [facility]. Consensus was to have slides reviewed from endometrial biopsy and if carcinosarcoma is confirmed in the endometrial biopsy, would treat as carcinosarcoma even though  there is no residual carcinosarcoma in hysterectomy specimen. Recommend chemo/VBT. If [facility] review did not reveal carcinosarcoma in biopsy then recommend treating as IA, grade 2 endometrioid adenocarcinoma of th endometrium and can offer observation vs VBT.   She has seen palliative care outpatient.   Gynecologic Oncology History:  She presented with postmenopausal bleeding which started in April. She has continued to have bleeding which continues every other day. Evaluation included the following:  06/15/2020 EMBX : CARCINOSARCOMA (MALIGNANT MIXED MULLERIAN TUMOR). Uterus sounded to 9 cm.   Pap: Pap IG (Image Guided) -  Comment: EPITHELIAL CELL ABNORMALITY.  ATYPICAL GLANDULAR CELLS ([facility]).   CT abdomen/pelvis scan has been ordered by Dr. Ouida Sills and is scheduled 06/29/2020.   Prior CT scan 09/22/2019 IMPRESSION: 1. 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 Left sided hydronephrosis is seen to the level of the ureteropelvic junction, as on 07/08/2018.  2. Right renal stone. 3. Advanced left hip osteoarthritis. 4.  Aortic atherosclerosis (ICD10-170.0).  She has multiple medical issues including HTN, malnutrition, type 2 DM (with h/o AKI and diabetic infections), aortic atherosclerosis, chronic venous insufficiency with severe lymphedema, and hyperlipidemia. She has had a prior ECHO 01/24/2017 which revealed normal function with mild LVH.   INTERPRETATION ECHO NORMAL LEFT VENTRICULAR SYSTOLIC FUNCTION WITH MILD LVH  NORMAL RIGHT VENTRICULAR SYSTOLIC FUNCTION  MILD VALVULAR REGURGITATION (See above)  NO VALVULAR STENOSIS  EF 50-55%  Closest EF: >55% (Estimated)  LVH: MILDLVH  Mitral: MILD MR   She lives in [facility] facility. [name], one of the facility aids came to the appointment with her today.     Problem List: Patient Active Problem List   Diagnosis Date Noted  . Carcinosarcoma of endometrium ([place]) 06/22/2020  . Palliative care encounter  01/06/2019  . Localized edema 01/06/2019  . Shortness of breath 01/06/2019  . Hypertension 08/25/2018  .  Cellulitis of lower extremity 04/06/2018  . Mild protein-calorie malnutrition ([place]) 04/06/2018  . Pressure injury of skin 03/27/2018  . Sepsis ([place]) 03/26/2018  . Diabetic infection of left foot ([place]) 03/26/2018  . AKI (acute kidney injury) ([place]) 03/26/2018  . Venous ulcer of left leg ([place]) 11/18/2016  . Chronic venous insufficiency 11/18/2016  . Lymphedema 11/18/2016  . Swelling of limb 11/18/2016  . Type 2 diabetes mellitus with insulin therapy ([place]) 11/18/2016  . Hyperlipidemia 11/18/2016    Past Medical History: Past Medical History:  Diagnosis Date  . Adult failure to thrive   . Aortic atherosclerosis ([place])   . Asthma   . Carcinosarcoma of endometrium ([place])   . Cellulitis of left foot   . Cellulitis of right foot   . Diabetes mellitus without complication ([place])   . Glaucoma   . HLD (hyperlipidemia)   . Hypertension   . Protein calorie malnutrition ([place])   . Stage 2 skin ulcer of sacral region ([place])   . Thyroid disease    hypothyroid  . Urinary retention     Past Surgical History: Past Surgical History:  Procedure Laterality Date  . APPENDECTOMY    . LOWER EXTREMITY ANGIOGRAPHY Left 01/04/2019   Procedure: LOWER EXTREMITY ANGIOGRAPHY;  Surgeon: Algernon Huxley, MD;  Location: [facility];  Service: Cardiovascular;  Laterality: Left;    Past Gynecologic History:  Menarche: unknown, never had a period per her report Menstrual details: as above History of OCP/HRT use: no History of Abnormal pap: as per HPI  Last pap: as per HPI History of STDs: The patient denies history of sexually transmitted disease.  OB History: G0P0 OB History  No obstetric history on file.    Family History: Family History  Problem Relation Age of Onset  . Diabetes Mother   . Diabetes Father   . Alzheimer's disease Father   . Breast cancer Neg Hx     Social  History: Social History   Socioeconomic History  . Marital status: Single    Spouse name: Not on file  . Number of children: Not on file  . Years of education: Not on file  . Highest education level: Not on file  Occupational History  . 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 Smoking status: Former Research scientist (life sciences)  . Smokeless tobacco: Never Used  Vaping Use  . Vaping Use: Never used  Substance and Sexual Activity  . Alcohol use: No  . Drug use: No  . Sexual activity: Not on file  Other Topics Concern  . Not on file  Social History Narrative  . Not on file   Social Determinants of Health   Financial Resource Strain:   . Difficulty of Paying Living Expenses:   Food Insecurity:   . Worried About Charity fundraiser in the Last Year:   . Arboriculturist in the Last Year:   Transportation Needs:   . Film/video editor (Medical):   Marland Kitchen Lack of Transportation (Non-Medical):   Physical Activity:   . Days of Exercise per Week:   . Minutes of Exercise per Session:   Stress:   . Feeling of Stress :   Social Connections:   . Frequency of Communication with Friends and Family:   . Frequency of Social Gatherings with Friends and Family:   . Attends Religious Services:   . Active Member of Clubs or Organizations:   . Attends Archivist  Meetings:   Marland Kitchen Marital Status:   Intimate Partner Violence:   . Fear of Current or Ex-Partner:   . Emotionally Abused:   Marland Kitchen Physically Abused:   . Sexually Abused:     Allergies: Allergies  Allergen Reactions  . Penicillins Swelling    Has patient had a PCN reaction causing immediate rash, facial/tongue/throat swelling, SOB or lightheadedness with hypotension: Unknown Has patient had a PCN reaction causing severe rash involving mucus membranes or skin necrosis: Unknown Has patient had a PCN reaction that required hospitalization: Unknown Has patient had a PCN reaction occurring within the last 10 years:  Unknown If all of the above answers are "NO", then may proceed with Cephalosporin use.     Current Medications: Current Outpatient Medications  Medication Sig Dispense Refill  . acetaminophen (TYLENOL) 325 MG tablet Take 650 mg by mouth every 6 (six) hours as needed.    . Cholecalciferol (D3-50) 50000 units capsule Take 50,000 Units by mouth once a week.    . Cyanocobalamin (B-12) 1000 MCG CAPS Take 1,000 mcg by mouth daily. 90 capsule 1  . ferrous sulfate 325 (65 FE) MG tablet Take 325 mg by mouth daily with breakfast.    . fluticasone (FLONASE) 50 MCG/ACT nasal spray Place 1 spray into both nostrils daily.    Marland Kitchen gabapentin (NEURONTIN) 100 MG capsule Take 300 mg by mouth daily.     Marland Kitchen glipiZIDE (GLUCOTROL) 5 MG tablet Take by mouth daily before breakfast.    . insulin aspart (NOVOLOG) 100 UNIT/ML injection Inject into the skin 3 (three) times daily before meals.    Marland Kitchen levothyroxine (SYNTHROID, LEVOTHROID) 125 MCG tablet Take 1 tablet (125 mcg total) by mouth daily before breakfast. 30 tablet 0  . tamsulosin (FLOMAX) 0.4 MG CAPS capsule Take 1 capsule (0.4 mg total) by mouth daily. 30 capsule 0   No current facility-administered medications for this visit.   Review of Systems General:  no complaints Skin: no complaints Eyes: no complaints HEENT: no complaints Breasts: no complaints Pulmonary: no complaints Cardiac: no complaints Gastrointestinal: no complaints Genitourinary/Sexual: no complaints Ob/Gyn: no complaints Musculoskeletal: no complaints Hematology: no complaints Neurologic/Psych: no complaints   Objective:  Physical Examination:  Today's Vitals   08/09/20 1250  BP: 119/72  Pulse: 87  Resp: 20  Temp: 97.8 F (36.6 C)  SpO2: 100%  Weight: 226 lb 12.8 oz (102.9 kg)  PainSc: 0-No pain   Body mass index is 38.93 kg/m.  LMP  (LMP Unknown)    ECOG Performance Status: 2 - Symptomatic, <50% confined to bed  GENERAL: Patient is a well appearing female in no  acute distress. Comes from assisted living facility. Aid accompanies patient HEENT:  Sclera clear. Anicteric NODES:  Negative axillary, supraclavicular, inguinal lymph node survery LUNGS:  Clear to auscultation bilaterally.   HEART:  Regular rate and rhythm.  ABDOMEN:  Soft, nontender.  No hernias, incisions well healed. No masses or ascites EXTREMITIES: Bilateral lower extremity stockings with edema and severe chronic vascular skin changes bilaterally.  SKIN:  Clear with no obvious rashes or skin changes.  NEURO:  Nonfocal. Well oriented.  Appropriate affect.  Pelvic: exam chaperoned by nurse;  Vulva: normal appearing vulva with no masses, tenderness or lesions; Vagina: normal vagina; narrow caliber; cuff healing well, Rectal: not indicated  Lab Review No labs on site today.  Radiologic Imaging: No imaging on site today.     Assessment:  BRADIE SANGIOVANNI is a 69 y.o. P0 female diagnosed withIn carcinosarcoma  of the uterus on biopsy.  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  No carcinosarcoma was noted.  Normal post op exam.   Microsatellite stable tumor.  Medical co-morbidities complicating care: including HTN, malnutrition, type 2 DM (with h/o AKI and diabetic infections), chronic venous insufficiency with severe lymphedema, and hyperlipidemia. She has had a prior ECHO 01/24/2017 which revealed normal function with mild LVH.  Plan:   Problem List Items Addressed This Visit      Genitourinary   Carcinosarcoma of endometrium [facility]) - Primary     Case presented at Tumor Board on 07/31/20 at [facility] Consensus was to have slides reviewed from endometrial biopsy and if carcinosarcoma is confirmed in the endometrial biopsy, would treat as carcinosarcoma even though there is no residual carcinosarcoma in hysterectomy specimen. Recommend chemo/VBT. If  [facility] review did not reveal carcinosarcoma in biopsy then recommend treating as IA, grade 2 endometrioid adenocarcinoma of th endometrium and can offer observation vs VBT. I discussed this recommendation with her.  Will contact patient when path review is complete.   The patient's diagnosis, an outline of the further diagnostic and laboratory studies which will be required, the recommendation, and alternatives were discussed.  All questions were answered to the patient's satisfaction.  Yolanda Rutter, DNP, AGNP-C [place] at [facility] [phone] (clinic)  I personally interviewed and examined the patient. Agreed with the above/below plan of care. I have directly contributed to assessment and plan of care of this patient and educated and discussed with patient and family.  Mellody Drown, MD  CC:  Schermerhorn, Gwen Her, MD [address] [facility] [place],  [place] [address] [phone]

## 2020-08-09 NOTE — Progress Notes (Signed)
[facility] Consult Note Telephone: [phone]  Fax: [phone]  PATIENT NAME: [name] DOB: [birth year] MRN: [id number]  PRIMARY CARE PROVIDER:   Dorthula Matas NP, [facility]  REFERRING PROVIDER:  Kirk Ruths, MD [address] [place],  [place] [address]  RESPONSIBLE PARTY:  self       RECOMMENDATIONS and PLAN:  1.  Advanced care planning. Patient is a DNR.  2. Functional status.  Patient ambulates with a walker.  She is mostly independent for ADLs. Requires assistance with IADLs.  Is continent of B&B.   Continue supportive care at the facility  3.  Nutritional status.  Patient states appetite is fair.  Does not always like what is served at the facility.  No reported weight loss.  Currently weighs 226 pounds with BMI of 41.44.  She has not had any falls or infections since last visit. No concerns reported by staff. Doing well post hysterectomy for endometrial cancer.  Continue follow up and recommendations by oncology. Palliative care will continue to monitor for symptom management/decline and make recommendations as needed.Will follow up in 4-6 weeks.  I spent 30 minutes providing this consultation,  from 9:15 to 9:45 including time spent with patient/family, chart review, provider coordination, documentation. More than 50% of the time in this consultation was spent coordinating communication.   HISTORY OF PRESENT ILLNESS:  [name] is a 69 y.o. year old female with multiple medical problems including including lymphedema, PVD,hyperlipidemia, hypothyroidism, neuropathy. Palliative Care was asked to help address goals of care.  Patient underwent hysterectomy on 07/13/20.  Recommendations from Tumor Board on 07/31/2020 "Recommendations: obtain OSH slides to determine if there was definitively a sarcomatous component in the endometrial biopsy. If carcinosarcoma is confirmed in the  endometrial biopsy would treat as carcinosarcoma even though there is no residual carcinosarcoma in the hysterectomy specimen. If carcinosarcoma would recommend chemotherapy/VBT. If [facility] review of the pathology does not demonstrate carcinosarcoma in the endometrial biopsy will treat as IA, grade 2 endometrioid adenocarcinoma of the endometrium and can offer observation or VBT. "  Patient denies any pain, bleeding, or drainage at the incision sites.  Denies fever, pain, or any further vaginal bleeding. Denies increased SOB or cough, headache, dizziness, chest pain, N/V/D, constipation, dysuria, hematuria, anxiety or depression. Has appointment with oncology this afternoon for further follow up and recommendations.    CODE STATUS: DNR  PPS: 50% HOSPICE ELIGIBILITY/DIAGNOSIS: TBD  PHYSICAL EXAM:  General: NAD, frail appearing Cardiovascular: regular rate and rhythm Pulmonary:lung sounds clear; normal respiratory effort Abdomen: soft, nontender, + bowel sounds GU: no suprapubic tenderness Extremities:2-3+ pitting edema,patienthas wraps in place on bilateral legs;no joint deformities Neurological: Weakness but otherwise nonfocal  PAST MEDICAL HISTORY:  Past Medical History:  Diagnosis Date  . Adult failure to thrive   . Aortic atherosclerosis ([place])   . Asthma   . Carcinosarcoma of endometrium ([place])   . Cellulitis of left foot   . Cellulitis of right foot   . Diabetes mellitus without complication ([place])   . Glaucoma   . HLD (hyperlipidemia)   . Hypertension   . Protein calorie malnutrition ([place])   . Stage 2 skin ulcer of sacral region ([place])   . Thyroid disease    hypothyroid  . Urinary retention     SOCIAL HX:  Social History   Tobacco Use  . Smoking status: Former Research scientist (life sciences)  . Smokeless tobacco: Never Used  Substance  Use Topics  . Alcohol use: No    ALLERGIES:  Allergies  Allergen Reactions  . Penicillins Swelling    Has patient had a PCN reaction causing immediate  rash, facial/tongue/throat swelling, SOB or lightheadedness with hypotension: Unknown Has patient had a PCN reaction causing severe rash involving mucus membranes or skin necrosis: Unknown Has patient had a PCN reaction that required hospitalization: Unknown Has patient had a PCN reaction occurring within the last 10 years: Unknown If all of the above answers are "NO", then may proceed with Cephalosporin use.      PERTINENT MEDICATIONS:  Outpatient Encounter Medications as of 08/09/2020  Medication Sig  . acetaminophen (TYLENOL) 325 MG tablet Take 650 mg by mouth every 6 (six) hours as needed.  . Cholecalciferol (D3-50) 50000 units capsule Take 50,000 Units by mouth once a week.  . Cyanocobalamin (B-12) 1000 MCG CAPS Take 1,000 mcg by mouth daily.  . ferrous sulfate 325 (65 FE) MG tablet Take 325 mg by mouth daily with breakfast.  . fluticasone (FLONASE) 50 MCG/ACT nasal spray Place 1 spray into both nostrils daily.  Marland Kitchen gabapentin (NEURONTIN) 100 MG capsule Take 300 mg by mouth daily.   Marland Kitchen glipiZIDE (GLUCOTROL) 5 MG tablet Take by mouth daily before breakfast.  . insulin aspart (NOVOLOG) 100 UNIT/ML injection Inject into the skin 3 (three) times daily before meals.  Marland Kitchen levothyroxine (SYNTHROID, LEVOTHROID) 125 MCG tablet Take 1 tablet (125 mcg total) by mouth daily before breakfast.  . tamsulosin (FLOMAX) 0.4 MG CAPS capsule Take 1 capsule (0.4 mg total) by mouth daily.   No facility-administered encounter medications on file as of 08/09/2020.     Commodore Bellew Jenetta Downer, NP

## 2020-08-21 ENCOUNTER — Telehealth: Payer: Self-pay

## 2020-08-21 NOTE — Telephone Encounter (Signed)
[facility] pathology review noted. No changes in follow made at this time.

## 2020-09-27 ENCOUNTER — Non-Acute Institutional Stay: Payer: Medicare Other | Admitting: Adult Health Nurse Practitioner

## 2020-09-27 ENCOUNTER — Other Ambulatory Visit: Payer: Self-pay

## 2020-09-27 DIAGNOSIS — C541 Malignant neoplasm of endometrium: Secondary | ICD-10-CM

## 2020-09-27 DIAGNOSIS — Z515 Encounter for palliative care: Secondary | ICD-10-CM

## 2020-09-27 NOTE — Progress Notes (Signed)
[facility] Consult Note Telephone: [phone]  Fax: [phone]  PATIENT NAME: [name] DOB: Dec 23, 1951 MRN: [id number]  PRIMARY CARE PROVIDER: Dorthula Matas NP, [facility]  REFERRING PROVIDER:  Kirk Ruths, MD [address] [place],  [place] [address]  RESPONSIBLE PARTY:  self   RECOMMENDATIONS and PLAN: 1.Advanced care planning. Patient is a DNR.  2. Functional status.  Patient ambulates with a walker.  She is mostly independent for ADLs. Requires assistance with IADLs.  Is continent of B&B.   Continue supportive care at the facility  3.  Nutritional status.  Patient states appetite is fair.  Does not always like what is served at the facility.  No reported weight loss.  She has not had any falls, infections, or hospital visits since last visit.  Continue follow up and recommendations by oncology.  Encouraged to call if she wants to go over treatment options.  Palliative care will continue to monitor for symptom management/decline and make recommendations as needed.Will follow up in4-6 weeks.  I spent 30 minutes providing this consultation,  from 9:00 to 9:30 including time spent with patient/family, chart review, provider coordination, documentation. More than 50% of the time in this consultation was spent coordinating communication.   HISTORY OF PRESENT ILLNESS:  [name] is a 69 y.o. year old female with multiple medical problems including lymphedema, PVD,hyperlipidemia, hypothyroidism, neuropathy. Palliative Care was asked to help address goals of care. Patient is still waiting results of biopsy for her uterine tumors.  Does have appointment on December for follow up.  Does state that if she has to undergo treatment that she would prefer to have it done in [place], which is closer to home.  Denies pain, vaginal bleeding, increased SOB or cough, headaches,  dizziness, N/V/D, constipation, dysuria, hematuria.  No concerns reported by staff.    CODE STATUS: DNR  PPS: 50% HOSPICE ELIGIBILITY/DIAGNOSIS: TBD  PHYSICAL EXAM: HR 77 O2 97 % on RA General: NAD, frail appearing Cardiovascular: regular rate and rhythm Pulmonary:lung sounds clear; normal respiratory effort Abdomen: soft, nontender, + bowel sounds GU: no suprapubic tenderness Extremities:2-3+ pitting edema,patienthas wraps in place on bilateral legs;no joint deformities Neurological: Weakness but otherwise nonfocal  PAST MEDICAL HISTORY:  Past Medical History:  Diagnosis Date  . Adult failure to thrive   . Aortic atherosclerosis ([place])   . Asthma   . 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 Cellulitis of left foot   . Cellulitis of right foot   . Diabetes mellitus without complication ([place])   . Glaucoma   . HLD (hyperlipidemia)   . Hypertension   . Protein calorie malnutrition ([place])   . Stage 2 skin ulcer of sacral region ([place])   . Thyroid disease    hypothyroid  . Urinary retention     SOCIAL HX:  Social History   Tobacco Use  . Smoking status: Former Research scientist (life sciences)  . Smokeless tobacco: Never Used  Substance Use Topics  . Alcohol use: No    ALLERGIES:  Allergies  Allergen Reactions  . Penicillins Swelling    Has patient had a PCN reaction causing immediate rash, facial/tongue/throat swelling, SOB or lightheadedness with hypotension: Unknown Has patient had a PCN reaction causing severe rash involving mucus membranes or skin necrosis: Unknown Has patient had a PCN reaction that required hospitalization: Unknown Has patient had a PCN reaction occurring within the last 10 years: Unknown If all of the above  answers are "NO", then may proceed with Cephalosporin use.      PERTINENT MEDICATIONS:  Outpatient Encounter Medications as of 09/27/2020  Medication Sig  . acetaminophen (TYLENOL) 325 MG tablet Take 650 mg by mouth every 6 (six) hours as needed.  Marland Kitchen ammonium  lactate (AMLACTIN) 12 % cream Apply topically.  . Cholecalciferol (D3-50) 50000 units capsule Take 50,000 Units by mouth once a week.  . Cyanocobalamin (B-12) 1000 MCG CAPS Take 1,000 mcg by mouth daily.  Marland Kitchen enoxaparin (LOVENOX) 40 MG/0.4ML injection Inject into the skin.  Marland Kitchen enoxaparin (LOVENOX) 40 MG/0.4ML injection Inject into the skin.  . ferrous sulfate 325 (65 FE) MG tablet Take 325 mg by mouth daily with breakfast.  . fluticasone (FLONASE) 50 MCG/ACT nasal spray Place 1 spray into both nostrils daily.  Marland Kitchen gabapentin (NEURONTIN) 100 MG capsule Take 300 mg by mouth daily.   Marland Kitchen gabapentin (NEURONTIN) 300 MG capsule Take by mouth.  Marland Kitchen glipiZIDE (GLUCOTROL) 5 MG tablet Take by mouth daily before breakfast.  . GLOBAL EASY GLIDE PEN NEEDLES 32G X 4 MM MISC   . ibuprofen (ADVIL) 600 MG tablet Take 600 mg by mouth every 6 (six) hours as needed.  . insulin aspart (NOVOLOG) 100 UNIT/ML injection Inject into the skin 3 (three) times daily before meals.  Marland Kitchen levothyroxine (SYNTHROID, LEVOTHROID) 125 MCG tablet Take 1 tablet (125 mcg total) by mouth daily before breakfast.  . tamsulosin (FLOMAX) 0.4 MG CAPS capsule Take 1 capsule (0.4 mg total) by mouth daily.  . Zinc Oxide 10 % OINT Apply topically.   No facility-administered encounter medications on file as of 09/27/2020.     Daviona Herbert Jenetta Downer, NP

## 2020-11-24 ENCOUNTER — Other Ambulatory Visit: Payer: Self-pay

## 2020-11-24 ENCOUNTER — Non-Acute Institutional Stay: Payer: Medicare Other | Admitting: Adult Health Nurse Practitioner

## 2020-11-24 DIAGNOSIS — C541 Malignant neoplasm of endometrium: Secondary | ICD-10-CM

## 2020-11-24 DIAGNOSIS — Z515 Encounter for palliative care: Secondary | ICD-10-CM

## 2020-11-24 DIAGNOSIS — I890 Lymphedema, not elsewhere classified: Secondary | ICD-10-CM

## 2020-11-24 NOTE — Progress Notes (Signed)
[facility] Consult Note Telephone: [phone]  Fax: [phone]  PATIENT NAME: [name] DOB: 11-16-1951 MRN: [id number]  PRIMARY CARE PROVIDER:   Dorthula Matas NP, [facility]  REFERRING PROVIDER:  Kirk Ruths, MD [place] [place],  [place] [address]  RESPONSIBLE PARTY:   self  Chief Complaint:  Follow up palliative visit/ complex medical decision making   RECOMMENDATIONS and PLAN: 1.Advanced care planning. Patient is a DNR.  2.  Carcinosarcoma of endometrium.  Has had hysterectomy.  No reports of vaginal bleeding today.  Has follow up with oncology on 12/06/20 for further discussion if any further treatment needed.  Continue follow up and recommendations by oncology.    3.  Lymphedema.  Patient not wearing wraps today with no ulcers or weeping noted.  Continue current plan of care.  Palliative care will continue to monitor for symptom management/decline and make recommendations as needed.Will follow up in8-10 weeks.   I spent 30 minutes providing this consultation. More than 50% of the time in this consultation was spent coordinating communication.   HISTORY OF PRESENT ILLNESS:  [name] is a 69 y.o. year old female with multiple medical problems including carcinosarcoma of endometrium, lymphedema, PVD,hyperlipidemia, hypothyroidism, neuropathy. Palliative Care was asked to help address goals of care. Patient states that her edema is somewhat improved and does not get any worse as long as she stays away from salt.  Does state that she has gained weight and notices that she has increased SOB with the weight gain.  Denies any vaginal bleeding.  Rest of 10 point ROS asked and negative.  Staff have no new concerns today.  She has not had any falls, infection, or hospital visits since last visit.    CODE STATUS: DNR  PPS: 50% HOSPICE ELIGIBILITY/DIAGNOSIS:  TBD  PHYSICAL EXAM: HR 75 O2 98 % on RA General: NAD, frail appearing Eyes:  Sclera anicteric and noninjected with no discharge noted ENMT: moist mucous membranes Cardiovascular: regular rate and rhythm Pulmonary:lung sounds clear; normal respiratory effort Abdomen: soft, nontender, + bowel sounds Extremities:2-3+ pitting edema;no joint deformities Neurological: Weakness but otherwise nonfocal  PAST MEDICAL HISTORY:  Past Medical History:  Diagnosis Date  . Adult failure to thrive   . Aortic atherosclerosis ([place])   . Asthma   . 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 Cellulitis of left foot   . Cellulitis of right foot   . Diabetes mellitus without complication ([place])   . Glaucoma   . HLD (hyperlipidemia)   . Hypertension   . Protein calorie malnutrition ([place])   . Stage 2 skin ulcer of sacral region ([place])   . Thyroid disease    hypothyroid  . Urinary retention     SOCIAL HX:  Social History   Tobacco Use  . Smoking status: Former Research scientist (life sciences)  . Smokeless tobacco: Never Used  Substance Use Topics  . Alcohol use: No    ALLERGIES:  Allergies  Allergen Reactions  . Penicillins Swelling    Has patient had a PCN reaction causing immediate rash, facial/tongue/throat swelling, SOB or lightheadedness with hypotension: Unknown Has patient had a PCN reaction causing severe rash involving mucus membranes or skin necrosis: Unknown Has patient had a PCN reaction that required hospitalization: Unknown Has patient had a PCN reaction occurring within the last 10 years: Unknown If all of the above answers are "NO", then may proceed with Cephalosporin use.  PERTINENT MEDICATIONS:  Outpatient Encounter Medications as of 11/24/2020  Medication Sig  . acetaminophen (TYLENOL) 325 MG tablet Take 650 mg by mouth every 6 (six) hours as needed.  Marland Kitchen ammonium lactate (AMLACTIN) 12 % cream Apply topically.  . Cholecalciferol (D3-50) 50000 units capsule Take 50,000 Units by mouth once a  week.  . Cyanocobalamin (B-12) 1000 MCG CAPS Take 1,000 mcg by mouth daily.  Marland Kitchen enoxaparin (LOVENOX) 40 MG/0.4ML injection Inject into the skin.  Marland Kitchen enoxaparin (LOVENOX) 40 MG/0.4ML injection Inject into the skin.  . ferrous sulfate 325 (65 FE) MG tablet Take 325 mg by mouth daily with breakfast.  . fluticasone (FLONASE) 50 MCG/ACT nasal spray Place 1 spray into both nostrils daily.  Marland Kitchen gabapentin (NEURONTIN) 100 MG capsule Take 300 mg by mouth daily.   Marland Kitchen gabapentin (NEURONTIN) 300 MG capsule Take by mouth.  Marland Kitchen glipiZIDE (GLUCOTROL) 5 MG tablet Take by mouth daily before breakfast.  . GLOBAL EASY GLIDE PEN NEEDLES 32G X 4 MM MISC   . ibuprofen (ADVIL) 600 MG tablet Take 600 mg by mouth every 6 (six) hours as needed.  . insulin aspart (NOVOLOG) 100 UNIT/ML injection Inject into the skin 3 (three) times daily before meals.  Marland Kitchen levothyroxine (SYNTHROID, LEVOTHROID) 125 MCG tablet Take 1 tablet (125 mcg total) by mouth daily before breakfast.  . tamsulosin (FLOMAX) 0.4 MG CAPS capsule Take 1 capsule (0.4 mg total) by mouth daily.  . Zinc Oxide 10 % OINT Apply topically.   No facility-administered encounter medications on file as of 11/24/2020.     Brittley Regner Jenetta Downer, NP

## 2020-12-06 ENCOUNTER — Ambulatory Visit: Payer: Medicare Other

## 2020-12-13 ENCOUNTER — Inpatient Hospital Stay: Payer: Medicare Other | Attending: Obstetrics and Gynecology | Admitting: Obstetrics and Gynecology

## 2020-12-13 ENCOUNTER — Encounter: Payer: Self-pay | Admitting: Obstetrics and Gynecology

## 2020-12-13 VITALS — BP 137/68 | HR 88 | Temp 97.5°F | Resp 20 | Wt 238.8 lb

## 2020-12-13 DIAGNOSIS — I872 Venous insufficiency (chronic) (peripheral): Secondary | ICD-10-CM | POA: Diagnosis not present

## 2020-12-13 DIAGNOSIS — I700 Atherosclerosis of aorta: Secondary | ICD-10-CM | POA: Insufficient documentation

## 2020-12-13 DIAGNOSIS — M1612 Unilateral primary osteoarthritis, left hip: Secondary | ICD-10-CM | POA: Diagnosis not present

## 2020-12-13 DIAGNOSIS — Z90722 Acquired absence of ovaries, bilateral: Secondary | ICD-10-CM | POA: Diagnosis not present

## 2020-12-13 DIAGNOSIS — E119 Type 2 diabetes mellitus without complications: Secondary | ICD-10-CM | POA: Insufficient documentation

## 2020-12-13 DIAGNOSIS — Q631 Lobulated, fused and horseshoe kidney: Secondary | ICD-10-CM | POA: Insufficient documentation

## 2020-12-13 DIAGNOSIS — E785 Hyperlipidemia, unspecified: Secondary | ICD-10-CM | POA: Diagnosis not present

## 2020-12-13 DIAGNOSIS — Z7901 Long term (current) use of anticoagulants: Secondary | ICD-10-CM | POA: Insufficient documentation

## 2020-12-13 DIAGNOSIS — Z79899 Other long term (current) drug therapy: Secondary | ICD-10-CM | POA: Insufficient documentation

## 2020-12-13 DIAGNOSIS — C541 Malignant neoplasm of endometrium: Secondary | ICD-10-CM | POA: Diagnosis not present

## 2020-12-13 DIAGNOSIS — I10 Essential (primary) hypertension: Secondary | ICD-10-CM | POA: Insufficient documentation

## 2020-12-13 DIAGNOSIS — Z90710 Acquired absence of both cervix and uterus: Secondary | ICD-10-CM

## 2020-12-13 NOTE — Progress Notes (Signed)
Gynecologic Oncology Interval Visit   Referring Provider: Boykin Nearing, MD [address] [facility] [place],  [place] [address] [phone]  Chief Concern: endometrial cancer survellance  Subjective:  MILIANNA ERICSSON is a 69 y.o. G0P0 female, initially seen in consultation from Dr. Ouida Sills for carcinosarcoma of the uterus.   No bleeding, discharge, pain or other new symptoms.   Gynecologic Oncology History:  She presented with postmenopausal bleeding which started in April. She has continued to have bleeding which continues every other day. Evaluation included the following:  06/15/2020 EMBX : CARCINOSARCOMA (MALIGNANT MIXED MULLERIAN TUMOR). Uterus sounded to 9 cm.  Pap: Pap IG (Image Guided) -  Comment: EPITHELIAL CELL ABNORMALITY.  ATYPICAL GLANDULAR CELLS (AGC).   CT abdomen/pelvis scan has been ordered by Dr. Ouida Sills and is scheduled 06/29/2020.   Prior CT scan 09/22/2019 IMPRESSION: 1. 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 Left sided hydronephrosis is seen to the level of the ureteropelvic junction, as on 07/08/2018.  2. Right renal stone. 3. Advanced left hip osteoarthritis. 4.  Aortic atherosclerosis (ICD10-170.0).  She has multiple medical issues including HTN, malnutrition, type 2 DM (with h/o AKI and diabetic infections), aortic atherosclerosis, chronic venous insufficiency with severe lymphedema, and hyperlipidemia. She has had a prior ECHO 01/24/2017 which revealed normal function with mild LVH.   INTERPRETATION ECHO NORMAL LEFT VENTRICULAR SYSTOLIC FUNCTION WITH MILD LVH  NORMAL RIGHT VENTRICULAR SYSTOLIC FUNCTION  MILD VALVULAR REGURGITATION (See above)  NO VALVULAR STENOSIS  EF 50-55%  Closest EF: >55% (Estimated)  LVH: MILDLVH  Mitral: MILD MR   She lives in [facility] facility.    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   Pathology A.  Left external iliac sentinel lymph node, biopsy: One lymph node, negative for malignancy (0/1), with nonnecrotizing granulomatous inflammation. Acid fast and fungal stains (Gomori methenamine silver) on block A1 are negative. B.  Right external iliac sentinel lymph node, biopsy: One lymph node, negative for malignancy (0/1), with nonnecrotizing granulomatous inflammation. C.  Uterus with bilateral ovaries and fallopian tubes, hysterectomy and bilateral salpingo-oophorectomy: Endometrioid adenocarcinoma of the endometrium with squamous differentiation (5.1 cm), FIGO grade 2 of 3, see comment. Tumor is limited to the endometrium; no myometrial invasion is seen. Remaining myometrium: No pathologic diagnosis. Cervix: Negative for malignancy. Serosa: Negative for malignancy. Left ovary and Fallopian tube: Negative for malignancy. Right Fallopian tube: Negative for malignancy. Right ovary: Not identified.  Washing negative.  Mismatch repair: INTACT A. Immunohistochemistry: Normal result. Expression of MLH1, MSH2, MSH6, and PMS2 is retained in the neoplastic cells. B. PCR: Microsatellite stable (MSS).  Case presented at Tumor Board on 07/31/20 at [facility]. Consensus was to have slides reviewed from endometrial biopsy and if carcinosarcoma is confirmed in the endometrial biopsy, would treat as carcinosarcoma even though there is no residual carcinosarcoma in hysterectomy specimen. Recommend chemo/VBT. If [facility] review did not reveal carcinosarcoma in biopsy then recommend treating as IA, grade 2 endometrioid adenocarcinoma of th endometrium and can offer observation vs VBT.   Review of EMB at [facility] did not show carcinosarcoma, so no adjuvant therapy recommended in view of non invasive tumor.    She has seen palliative care outpatient.   Based on documentation she had a Chest CT on 06/2020 with the  findings notes below. She will continue follow up at [facility] as scheduled. No  further evaluation is needed at this time. Repeat imaging may be considered at future follow up visits.  IMPRESSION: 1. Changes of previous granulomatous infection with left lung calcified granulomata and calcified and noncalcified chronic mediastinal and bilateral hilar adenopathy. 2. Previously demonstrated horseshoe kidney with chronic left UPJ obstruction. 3. Calcific coronary artery and aortic atherosclerosis.  Problem List: Patient Active Problem List   Diagnosis Date Noted  . 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 Palliative care encounter 01/06/2019  . Localized edema 01/06/2019  . Shortness of breath 01/06/2019  . Hypertension 08/25/2018  . Cellulitis of lower extremity 04/06/2018  . Mild protein-calorie malnutrition ([place]) 04/06/2018  . Pressure injury of skin 03/27/2018  . Sepsis ([place]) 03/26/2018  . Diabetic infection of left foot ([place]) 03/26/2018  . AKI (acute kidney injury) ([place]) 03/26/2018  . Venous ulcer of left leg ([place]) 11/18/2016  . Chronic venous insufficiency 11/18/2016  . Lymphedema 11/18/2016  . Swelling of limb 11/18/2016  . Type 2 diabetes mellitus with insulin therapy ([place]) 11/18/2016  . Hyperlipidemia 11/18/2016    Past Medical History: Past Medical History:  Diagnosis Date  . Adult failure to thrive   . Aortic atherosclerosis ([place])   . Asthma   . 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 Cellulitis of left foot   . Cellulitis of right foot   . Diabetes mellitus without complication ([place])   . Glaucoma   . HLD (hyperlipidemia)   . Hypertension   . Protein calorie malnutrition ([place])   . Stage 2 skin ulcer of sacral region ([place])   . Thyroid disease    hypothyroid  . Urinary retention     Past Surgical History: Past Surgical History:  Procedure Laterality Date  . APPENDECTOMY    . LOWER EXTREMITY ANGIOGRAPHY Left 01/04/2019   Procedure: LOWER EXTREMITY  ANGIOGRAPHY;  Surgeon: Algernon Huxley, MD;  Location: [facility];  Service: Cardiovascular;  Laterality: Left;    Past Gynecologic History:  Menarche: unknown, never had a period per her report Menstrual details: as above History of OCP/HRT use: no History of Abnormal pap: as per HPI  Last pap: as per HPI History of STDs: The patient denies history of sexually transmitted disease.  OB History: G0P0 OB History  No obstetric history on file.    Family History: Family History  Problem Relation Age of Onset  . Diabetes Mother   . Diabetes Father   . Alzheimer's disease Father   . Breast cancer Neg Hx     Social History: Social History   Socioeconomic History  . Marital status: Single    Spouse name: Not on file  . Number of children: Not on file  . Years of education: Not on file  . Highest education level: Not on file  Occupational History  . 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 Smoking status: Former Research scientist (life sciences)  . Smokeless tobacco: Never Used  Vaping Use  . Vaping Use: Never used  Substance and Sexual Activity  . Alcohol use: No  . Drug use: No  . Sexual activity: Not on file  Other Topics Concern  . Not on file  Social History Narrative  . Not on file   Social Determinants of Health   Financial Resource Strain: Not on file  Food Insecurity: Not on file  Transportation Needs: Not on file  Physical Activity: Not on file  Stress: Not on file  Social Connections: Not on file  Intimate Partner Violence: Not on file    Allergies: Allergies  Allergen Reactions  . Penicillins Swelling    Has patient had a PCN reaction causing immediate rash, facial/tongue/throat swelling, SOB or lightheadedness with hypotension: Unknown Has patient had a PCN reaction causing severe rash involving mucus membranes or skin necrosis: Unknown Has patient had a PCN reaction that required hospitalization: Unknown Has patient had a PCN  reaction occurring within the last 10 years: Unknown If all of the above answers are "NO", then may proceed with Cephalosporin use.     Current Medications: Current Outpatient Medications  Medication Sig Dispense Refill  . acetaminophen (TYLENOL) 325 MG tablet Take 650 mg by mouth every 6 (six) hours as needed.    Marland Kitchen ammonium lactate (AMLACTIN) 12 % cream Apply topically.    Marland Kitchen atorvastatin (LIPITOR) 20 MG tablet Take 20 mg by mouth daily.    . chlorhexidine (PERIDEX) 0.12 % solution SMARTSIG:By Mouth    . Cholecalciferol 1.25 MG (50000 UT) capsule Take 50,000 Units by mouth once a week.    . Cyanocobalamin (B-12) 1000 MCG CAPS Take 1,000 mcg by mouth daily. 90 capsule 1  . enoxaparin (LOVENOX) 40 MG/0.4ML injection Inject into the skin.    . ferrous sulfate 325 (65 FE) MG tablet Take 325 mg by mouth daily with breakfast.    . fluticasone (FLONASE) 50 MCG/ACT nasal spray Place 1 spray into both nostrils daily.    Marland Kitchen gabapentin (NEURONTIN) 300 MG capsule Take by mouth.    Marland Kitchen glipiZIDE (GLUCOTROL) 10 MG tablet Take 10 mg by mouth daily.    Marland Kitchen GLOBAL EASY GLIDE PEN NEEDLES 32G X 4 MM MISC     . ibuprofen (ADVIL) 600 MG tablet Take 600 mg by mouth every 6 (six) hours as needed.    . insulin aspart (NOVOLOG) 100 UNIT/ML injection Inject into the skin 3 (three) times daily before meals.    Marland Kitchen levothyroxine (SYNTHROID, LEVOTHROID) 125 MCG tablet Take 1 tablet (125 mcg total) by mouth daily before breakfast. 30 tablet 0  . oxyCODONE (OXY IR/ROXICODONE) 5 MG immediate release tablet Take by mouth.    . tamsulosin (FLOMAX) 0.4 MG CAPS capsule Take 1 capsule (0.4 mg total) by mouth daily. 30 capsule 0  . Zinc Oxide 10 % OINT Apply topically.    . enoxaparin (LOVENOX) 40 MG/0.4ML injection Inject into the skin.     No current facility-administered medications for this visit.   Review of Systems General:  no complaints Skin: no complaints Eyes: no complaints HEENT: no complaints Breasts: no  complaints Pulmonary: no complaints Cardiac: no complaints Gastrointestinal: no complaints Genitourinary/Sexual: no complaints Ob/Gyn: no complaints Musculoskeletal: no complaints Hematology: no complaints Neurologic/Psych: no complaints   Objective:  Physical Examination:  Today's Vitals   12/13/20 1008  BP: 137/68  Pulse: 88  Resp: 20  Temp: (!) 97.5 F (36.4 C)  TempSrc: Tympanic  SpO2: 100%  Weight: 238 lb 12.8 oz (108.3 kg)   Body mass index is 40.99 kg/m.  BP 137/68   Pulse 88   Temp (!) 97.5 F (36.4 C) (Tympanic)   Resp 20   Wt 238 lb 12.8 oz (108.3 kg)   LMP  (LMP Unknown)   SpO2 100%   BMI 40.99 kg/m    ECOG Performance Status: 2 - Symptomatic, <50% confined to bed  GENERAL: Patient is a well appearing female in no acute distress. Comes from assisted living facility. Aid accompanies patient HEENT:  Sclera clear. Anicteric NODES:  Negative axillary, supraclavicular, inguinal lymph node survery LUNGS:  Clear to auscultation bilaterally.   HEART:  Regular rate and rhythm.  ABDOMEN:  Soft, nontender.  No hernias, incisions well healed. No masses or ascites EXTREMITIES: Bilateral lower extremity edema and severe chronic vascular skin changes bilaterally.  SKIN:  Clear with no obvious rashes or skin changes.  NEURO:  Nonfocal. Well oriented.  Appropriate affect.  Pelvic: exam chaperoned by nurse;  Vulva: normal appearing vulva with no masses, tenderness or lesions; Vagina: normal vagina; narrow caliber; cuff a bit thickened, but appears normal, Rectal: not indicated  Lab Review No labs on site today.  Radiologic Imaging: No imaging on site today.     Assessment:  SHANTERRIA FRANTA is a 69 y.o. P0 female diagnosed withIn carcinosarcoma of the uterus on endometrial biopsy.  She underwent TLH, BSO, SLN biopsies 07/13/20 and final pathology showed stage IA grade 2 endometrioid adenocarcinoma with squamous differentiation, measuring 5.1 cm confined to  endometrium.  No carcinosarcoma was noted.  Review of the original EMB at [facility] did not confirm carcinosarcoma, so no adjuvant therapy.  NED  Microsatellite stable tumor.  Medical co-morbidities complicating care: including HTN, malnutrition, type 2 DM (with h/o AKI and diabetic infections), chronic venous insufficiency with severe lymphedema, and hyperlipidemia. She has had a prior ECHO 01/24/2017 which revealed normal function with mild LVH.  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   Mellody Drown, MD  CC:  Schermerhorn, Gwen Her, MD [address] [facility] [place],  [place] [address] [phone]

## 2021-01-10 ENCOUNTER — Other Ambulatory Visit: Payer: Self-pay

## 2021-01-10 ENCOUNTER — Non-Acute Institutional Stay: Payer: Medicare Other | Admitting: Adult Health Nurse Practitioner

## 2021-01-10 DIAGNOSIS — I890 Lymphedema, not elsewhere classified: Secondary | ICD-10-CM

## 2021-01-10 DIAGNOSIS — E119 Type 2 diabetes mellitus without complications: Secondary | ICD-10-CM

## 2021-01-10 DIAGNOSIS — C541 Malignant neoplasm of endometrium: Secondary | ICD-10-CM

## 2021-01-10 DIAGNOSIS — Z794 Long term (current) use of insulin: Secondary | ICD-10-CM

## 2021-01-10 DIAGNOSIS — Z515 Encounter for palliative care: Secondary | ICD-10-CM

## 2021-01-10 NOTE — Progress Notes (Signed)
[facility] Consult Note Telephone: [phone]  Fax: [phone]  PATIENT NAME: [name] DOB: [birth year] MRN: [id number]  PRIMARY CARE PROVIDER:  Dorthula Matas NP, [facility]  REFERRING PROVIDER:  Kirk Ruths, MD [place],  [place] [address]  RESPONSIBLE PARTY:   self  Chief Complaint:  Follow up palliative visit/elevated blood sugars   RECOMMENDATIONS and PLAN: 1.Advanced care planning. Patient is a DNR.  2.  DMT2.  Blood sugars are monitored frequently daily. Though she is having concerns for increased blood sugars, staff feels like it is improving.  Discussed diet and exercise.  Discussed not drinking fluids 2 hours prior to going to bed to help with nocturia.   3.  Carcinosarcoma of endometrium. "original EMB at [facility] did not confirm carcinosarcoma, so no adjuvant therapy."  She is not experiencing any pain or bleeding related to this.  Continue follow up and recommendations by oncology.   4.  Lymphedema.  This is stable.  No increased edema today.  No weeping or ulcers noted.  Continue current plan of care.    Palliative care will continue to monitor for symptom management/decline and make recommendations as needed.Will follow up in8-10 weeks.   HISTORY OF PRESENT ILLNESS:  [name] is a 70 y.o. year old female with multiple medical problems including carcinosarcoma of endometrium, lymphedema, PVD,hyperlipidemia, hypothyroidism, neuropathy. Palliative Care was asked to help address goals of care.  Patient states that her blood sugars have been elevated prior to lunch and dinner meals.  States they have been as high as 250.  Staff reports that they have been better and her blood sugar today prior to lunch is 149.  Does state that she has nocturia but drinks water prior to going to bed.  Does state having an episode of diarrhea last week but this has  resolved and she now has daily normal BMs.  Denies pain, vaginal bleeding, N/V/D.    CODE STATUS: DNR  PPS: 50% HOSPICE ELIGIBILITY/DIAGNOSIS: TBD  PHYSICAL EXAM: HR 74 O2 99 % on RA General: NAD, frail appearing Eyes:  Sclera anicteric and noninjected with no discharge noted ENMT: moist mucous membranes Cardiovascular: regular rate and rhythm Pulmonary:lung sounds clear; normal respiratory effort Abdomen: soft, nontender, + bowel sounds Extremities:2-3+ pitting edema;no joint deformities Neurological: Weakness but otherwise nonfocal  PAST MEDICAL HISTORY:  Past Medical History:  Diagnosis Date  . Adult failure to thrive   . Aortic atherosclerosis ([place])   . Asthma   . Carcinosarcoma of endometrium ([place])   . Cellulitis of left foot   . Cellulitis of right foot   . Diabetes mellitus without complication ([place])   . Glaucoma   . HLD (hyperlipidemia)   . Hypertension   . Protein calorie malnutrition ([place])   . Stage 2 skin ulcer of sacral region ([place])   . Thyroid disease    hypothyroid  . Urinary retention     SOCIAL HX:  Social History   Tobacco Use  . Smoking status: Former Research scientist (life sciences)  . Smokeless tobacco: Never Used  Substance Use Topics  . Alcohol use: No    ALLERGIES:  Allergies  Allergen Reactions  . Penicillins Swelling    Has patient had a PCN reaction causing immediate rash, facial/tongue/throat swelling, SOB or lightheadedness with hypotension: Unknown Has patient had a PCN reaction causing severe rash involving mucus membranes or skin necrosis: Unknown Has patient had a PCN reaction that  required hospitalization: Unknown Has patient had a PCN reaction occurring within the last 10 years: Unknown If all of the above answers are "NO", then may proceed with Cephalosporin use.      PERTINENT MEDICATIONS:  Outpatient Encounter Medications as of 01/10/2021  Medication Sig  . acetaminophen (TYLENOL) 325 MG tablet Take 650 mg by mouth every 6 (six) hours as  needed.  Marland Kitchen ammonium lactate (AMLACTIN) 12 % cream Apply topically.  Marland Kitchen atorvastatin (LIPITOR) 20 MG tablet Take 20 mg by mouth daily.  . chlorhexidine (PERIDEX) 0.12 % solution SMARTSIG:By Mouth  . Cholecalciferol 1.25 MG (50000 UT) capsule Take 50,000 Units by mouth once a week.  . Cyanocobalamin (B-12) 1000 MCG CAPS Take 1,000 mcg by mouth daily.  Marland Kitchen enoxaparin (LOVENOX) 40 MG/0.4ML injection Inject into the skin.  Marland Kitchen enoxaparin (LOVENOX) 40 MG/0.4ML injection Inject into the skin.  . ferrous sulfate 325 (65 FE) MG tablet Take 325 mg by mouth daily with breakfast.  . fluticasone (FLONASE) 50 MCG/ACT nasal spray Place 1 spray into both nostrils daily.  Marland Kitchen gabapentin (NEURONTIN) 300 MG capsule Take by mouth.  Marland Kitchen glipiZIDE (GLUCOTROL) 10 MG tablet Take 10 mg by mouth daily.  Marland Kitchen GLOBAL EASY GLIDE PEN NEEDLES 32G X 4 MM MISC   . ibuprofen (ADVIL) 600 MG tablet Take 600 mg by mouth every 6 (six) hours as needed.  . insulin aspart (NOVOLOG) 100 UNIT/ML injection Inject into the skin 3 (three) times daily before meals.  Marland Kitchen levothyroxine (SYNTHROID, LEVOTHROID) 125 MCG tablet Take 1 tablet (125 mcg total) by mouth daily before breakfast.  . oxyCODONE (OXY IR/ROXICODONE) 5 MG immediate release tablet Take by mouth.  . tamsulosin (FLOMAX) 0.4 MG CAPS capsule Take 1 capsule (0.4 mg total) by mouth daily.  . Zinc Oxide 10 % OINT Apply topically.   No facility-administered encounter medications on file as of 01/10/2021.     Ayeshia Coppin Jenetta Downer, NP

## 2021-03-23 ENCOUNTER — Other Ambulatory Visit: Payer: Self-pay

## 2021-03-23 ENCOUNTER — Non-Acute Institutional Stay: Payer: Medicare Other | Admitting: Adult Health Nurse Practitioner

## 2021-03-23 DIAGNOSIS — C541 Malignant neoplasm of endometrium: Secondary | ICD-10-CM

## 2021-03-23 DIAGNOSIS — K5900 Constipation, unspecified: Secondary | ICD-10-CM

## 2021-03-23 DIAGNOSIS — Z515 Encounter for palliative care: Secondary | ICD-10-CM

## 2021-03-23 DIAGNOSIS — I890 Lymphedema, not elsewhere classified: Secondary | ICD-10-CM

## 2021-03-23 NOTE — Progress Notes (Signed)
[facility] Consult Note Telephone: [phone]  Fax: [phone]  PATIENT NAME: [name] DOB: 11-06-1951 MRN: [id number]  PRIMARY CARE PROVIDER:  Dorthula Matas NP, [facility]  REFERRING Garfield, Ocie Cornfield, MD [place], [place] [address]  RESPONSIBLE PARTY:self  Chief Complaint: Follow up palliative visit/constipation   RECOMMENDATIONS and PLAN:  1.  Advanced care planning. Patient is a DNR.  2.  Carcinosarcoma of endometrium.  This appears to be stable with no complaints of low abdominal pain or vaginal bleeding.  Continue follow-up and recommendations by oncology  3.  Lymphedema.  This is stable with no increased edema today.  No weeping or ulcers noted.  Continue current plan of care and follow-up with vein and vascular  4.  Constipation.  Patient just started daily MiraLAX 3 days ago along with stool softeners.  She is trying to increase water intake.  Continue to monitor for effectiveness  Palliative care will continue to monitor for symptom management/decline and make recommendations as needed.Will follow up in8-10weeks.   HISTORY OF PRESENT ILLNESS:  [name] is a 70 y.o. year old female with multiple medical problems including carcinosarcoma of endometrium,lymphedema, PVD,hyperlipidemia, hypothyroidism, neuropathy. Palliative Care was asked to help address goals of care.  Patient states that over the past couple of months she has been having some nausea and abdominal pain.  She does indicate with her hand that is about mid abdominal is where the pain is.  She does not have vomiting.  States that she has been eating less but has also been trying to lose weight.  States that she used to weigh 240 and is now to 202 pounds.  Denies any hematochezia or melena.  States that when she does go to the bathroom that her bowel movement is hard.  States that  her PCP at facility has started her on MiraLAX and stool softeners a few days ago.  Rest of 10 point ROS asked and negative.  Staff with no new concerns.  Patient has not had any falls, infections, hospital visits since last visit.  CODE STATUS: DNR  PPS: 50% HOSPICE ELIGIBILITY/DIAGNOSIS: TBD  PHYSICAL EXAM: HR 81O2 100% on RA General: NAD, frail appearing Eyes: Sclera anicteric and noninjected with no discharge noted ENMT: moist mucous membranes Cardiovascular: regular rate and rhythm Pulmonary:lung sounds clear; normal respiratory effort Abdomen: soft, nontender, + bowel sounds Extremities:Patient has edema to bilateral lower extremities and skin is very thickened and unable to determine if pitting and how much it is pitting;no joint deformities Neurological: Weakness but otherwise nonfocal  PAST MEDICAL HISTORY:  Past Medical History:  Diagnosis Date  . Adult failure to thrive   . Aortic atherosclerosis ([place])   . Asthma   . Carcinosarcoma of endometrium ([place])   . Cellulitis of left foot   . Cellulitis of right foot   . Diabetes mellitus without complication ([place])   . Glaucoma   . HLD (hyperlipidemia)   . Hypertension   . Protein calorie malnutrition ([place])   . Stage 2 skin ulcer of sacral region ([place])   . Thyroid disease    hypothyroid  . Urinary retention     SOCIAL HX:  Social History   Tobacco Use  . Smoking status: Former Research scientist (life sciences)  . Smokeless tobacco: Never Used  Substance Use Topics  . Alcohol use: No    ALLERGIES:  Allergies  Allergen Reactions  . Penicillins Swelling  Has patient had a PCN reaction causing immediate rash, facial/tongue/throat swelling, SOB or lightheadedness with hypotension: Unknown Has patient had a PCN reaction causing severe rash involving mucus membranes or skin necrosis: Unknown Has patient had a PCN reaction that required hospitalization: Unknown Has patient had a PCN reaction occurring within the last 10 years:  Unknown If all of the above answers are "NO", then may proceed with Cephalosporin use.      PERTINENT MEDICATIONS:  Outpatient Encounter Medications as of 03/23/2021  Medication Sig  . acetaminophen (TYLENOL) 325 MG tablet Take 650 mg by mouth every 6 (six) hours as needed.  Marland Kitchen ammonium lactate (AMLACTIN) 12 % cream Apply topically.  Marland Kitchen atorvastatin (LIPITOR) 20 MG tablet Take 20 mg by mouth daily.  . chlorhexidine (PERIDEX) 0.12 % solution SMARTSIG:By Mouth  . Cholecalciferol 1.25 MG (50000 UT) capsule Take 50,000 Units by mouth once a week.  . Cyanocobalamin (B-12) 1000 MCG CAPS Take 1,000 mcg by mouth daily.  Marland Kitchen enoxaparin (LOVENOX) 40 MG/0.4ML injection Inject into the skin.  Marland Kitchen enoxaparin (LOVENOX) 40 MG/0.4ML injection Inject into the skin.  . ferrous sulfate 325 (65 FE) MG tablet Take 325 mg by mouth daily with breakfast.  . fluticasone (FLONASE) 50 MCG/ACT nasal spray Place 1 spray into both nostrils daily.  Marland Kitchen gabapentin (NEURONTIN) 300 MG capsule Take by mouth.  Marland Kitchen glipiZIDE (GLUCOTROL) 10 MG tablet Take 10 mg by mouth daily.  Marland Kitchen GLOBAL EASY GLIDE PEN NEEDLES 32G X 4 MM MISC   . ibuprofen (ADVIL) 600 MG tablet Take 600 mg by mouth every 6 (six) hours as needed.  . insulin aspart (NOVOLOG) 100 UNIT/ML injection Inject into the skin 3 (three) times daily before meals.  Marland Kitchen levothyroxine (SYNTHROID, LEVOTHROID) 125 MCG tablet Take 1 tablet (125 mcg total) by mouth daily before breakfast.  . oxyCODONE (OXY IR/ROXICODONE) 5 MG immediate release tablet Take by mouth.  . tamsulosin (FLOMAX) 0.4 MG CAPS capsule Take 1 capsule (0.4 mg total) by mouth daily.  . Zinc Oxide 10 % OINT Apply topically.   No facility-administered encounter medications on file as of 03/23/2021.    Sweet Jarvis Jenetta Downer, NP

## 2021-04-05 ENCOUNTER — Emergency Department: Payer: Medicare Other

## 2021-04-05 ENCOUNTER — Other Ambulatory Visit: Payer: Self-pay

## 2021-04-05 ENCOUNTER — Emergency Department
Admission: EM | Admit: 2021-04-05 | Discharge: 2021-04-06 | Disposition: A | Discharge status: Home / Self Care | Payer: Medicare Other | Attending: Emergency Medicine | Admitting: Emergency Medicine

## 2021-04-05 DIAGNOSIS — I10 Essential (primary) hypertension: Secondary | ICD-10-CM | POA: Insufficient documentation

## 2021-04-05 DIAGNOSIS — E119 Type 2 diabetes mellitus without complications: Secondary | ICD-10-CM | POA: Insufficient documentation

## 2021-04-05 DIAGNOSIS — Z87891 Personal history of nicotine dependence: Secondary | ICD-10-CM | POA: Insufficient documentation

## 2021-04-05 DIAGNOSIS — E039 Hypothyroidism, unspecified: Secondary | ICD-10-CM | POA: Insufficient documentation

## 2021-04-05 DIAGNOSIS — C799 Secondary malignant neoplasm of unspecified site: Secondary | ICD-10-CM | POA: Diagnosis not present

## 2021-04-05 DIAGNOSIS — R110 Nausea: Secondary | ICD-10-CM | POA: Insufficient documentation

## 2021-04-05 DIAGNOSIS — J45909 Unspecified asthma, uncomplicated: Secondary | ICD-10-CM | POA: Insufficient documentation

## 2021-04-05 DIAGNOSIS — Z8542 Personal history of malignant neoplasm of other parts of uterus: Secondary | ICD-10-CM | POA: Insufficient documentation

## 2021-04-05 DIAGNOSIS — R748 Abnormal levels of other serum enzymes: Secondary | ICD-10-CM | POA: Insufficient documentation

## 2021-04-05 DIAGNOSIS — Z7984 Long term (current) use of oral hypoglycemic drugs: Secondary | ICD-10-CM | POA: Diagnosis not present

## 2021-04-05 DIAGNOSIS — R630 Anorexia: Secondary | ICD-10-CM | POA: Insufficient documentation

## 2021-04-05 DIAGNOSIS — Z79899 Other long term (current) drug therapy: Secondary | ICD-10-CM | POA: Diagnosis not present

## 2021-04-05 DIAGNOSIS — R1084 Generalized abdominal pain: Secondary | ICD-10-CM | POA: Insufficient documentation

## 2021-04-05 LAB — COMPREHENSIVE METABOLIC PANEL
ALT: 21 U/L (ref 0–44)
AST: 41 U/L (ref 15–41)
Albumin: 3.3 g/dL — ABNORMAL LOW (ref 3.5–5.0)
Alkaline Phosphatase: 200 U/L — ABNORMAL HIGH (ref 38–126)
Anion gap: 11 (ref 5–15)
BUN: 39 mg/dL — ABNORMAL HIGH (ref 8–23)
CO2: 22 mmol/L (ref 22–32)
Calcium: 9.3 mg/dL (ref 8.9–10.3)
Chloride: 105 mmol/L (ref 98–111)
Creatinine, Ser: 1.65 mg/dL — ABNORMAL HIGH (ref 0.44–1.00)
GFR, Estimated: 33 mL/min — ABNORMAL LOW (ref >60)
Glucose, Bld: 124 mg/dL — ABNORMAL HIGH (ref 70–99)
Potassium: 4.4 mmol/L (ref 3.5–5.1)
Sodium: 138 mmol/L (ref 135–145)
Total Bilirubin: 0.6 mg/dL (ref 0.3–1.2)
Total Protein: 8.4 g/dL — ABNORMAL HIGH (ref 6.5–8.1)

## 2021-04-05 LAB — CBC
HCT: 32.1 % — ABNORMAL LOW (ref 36.0–46.0)
Hemoglobin: 10.3 g/dL — ABNORMAL LOW (ref 12.0–15.0)
MCH: 28.1 pg (ref 26.0–34.0)
MCHC: 32.1 g/dL (ref 30.0–36.0)
MCV: 87.5 fL (ref 80.0–100.0)
Platelets: 314 10*3/uL (ref 150–400)
RBC: 3.67 MIL/uL — ABNORMAL LOW (ref 3.87–5.11)
RDW: 15.1 % (ref 11.5–15.5)
WBC: 9.4 10*3/uL (ref 4.0–10.5)
nRBC: 0 % (ref 0.0–0.2)

## 2021-04-05 LAB — URINALYSIS, COMPLETE (UACMP) WITH MICROSCOPIC
Bilirubin Urine: NEGATIVE
Glucose, UA: NEGATIVE mg/dL
Hgb urine dipstick: NEGATIVE
Ketones, ur: 5 mg/dL — AB
Nitrite: NEGATIVE
Protein, ur: NEGATIVE mg/dL
Specific Gravity, Urine: 1.02 (ref 1.005–1.030)
pH: 5 (ref 5.0–8.0)

## 2021-04-05 LAB — LIPASE, BLOOD: Lipase: 56 U/L — ABNORMAL HIGH (ref 11–51)

## 2021-04-05 MED ORDER — LACTATED RINGERS IV BOLUS
1000.0000 mL | Freq: Once | INTRAVENOUS | Status: AC
  Administered 2021-04-05: 1000 mL via INTRAVENOUS

## 2021-04-05 MED ORDER — ONDANSETRON HCL 4 MG/2ML IJ SOLN
4.0000 mg | Freq: Once | INTRAMUSCULAR | Status: AC
  Administered 2021-04-05: 4 mg via INTRAVENOUS
  Filled 2021-04-05: qty 2

## 2021-04-05 MED ORDER — IOHEXOL 300 MG/ML  SOLN
75.0000 mL | Freq: Once | INTRAMUSCULAR | Status: AC | PRN
  Administered 2021-04-05: 75 mL via INTRAVENOUS

## 2021-04-05 NOTE — ED Notes (Signed)
Patient transported to CT 

## 2021-04-05 NOTE — ED Notes (Signed)
Dr Jessup at bedside 

## 2021-04-05 NOTE — ED Provider Notes (Signed)
[facility] Emergency Department Provider Note   ____________________________________________   Event Date/Time   First MD Initiated Contact with Patient 04/05/21 2039     (approximate)  I have reviewed the triage vital signs and the nursing notes.   HISTORY  Chief Complaint Abdominal Pain    HPI [name] is a 70 y.o. female with past medical history of hypertension, hyperlipidemia, diabetes, and asthma who presents to the ED complaining of abdominal pain.  Patient reports that she has been dealing with intermittent pain in her abdomen for the past couple of months.  This is been associated with poor appetite and nausea, but she denies any vomiting or diarrhea.  She has not had any fevers and denies any dysuria, hematuria, or flank pain.  She has been only able to eat small amounts of food at one time due to the discomfort in her abdomen.        Past Medical History:  Diagnosis Date  . Adult failure to thrive   . Aortic atherosclerosis ([place])   . Asthma   . Carcinosarcoma of endometrium ([place])   . Cellulitis of left foot   . Cellulitis of right foot   . Diabetes mellitus without complication ([place])   . Glaucoma   . HLD (hyperlipidemia)   . Hypertension   . Protein calorie malnutrition ([place])   . Stage 2 skin ulcer of sacral region ([place])   . Thyroid disease    hypothyroid  . Urinary retention     Patient Active Problem List   Diagnosis Date Noted  . Carcinosarcoma of endometrium ([place]) 06/22/2020  . Palliative care encounter 01/06/2019  . Localized edema 01/06/2019  . Shortness of breath 01/06/2019  . Hypertension 08/25/2018  . Cellulitis of lower extremity 04/06/2018  . Mild protein-calorie malnutrition ([place]) 04/06/2018  . Pressure injury of skin 03/27/2018  . Sepsis ([place]) 03/26/2018  . Diabetic infection of left foot ([place]) 03/26/2018  . AKI (acute kidney injury) ([place]) 03/26/2018  . Venous ulcer of left leg ([place]) 11/18/2016  .  Chronic venous insufficiency 11/18/2016  . Lymphedema 11/18/2016  . Swelling of limb 11/18/2016  . Type 2 diabetes mellitus with insulin therapy ([place]) 11/18/2016  . Hyperlipidemia 11/18/2016    Past Surgical History:  Procedure Laterality Date  . APPENDECTOMY    . LOWER EXTREMITY ANGIOGRAPHY Left 01/04/2019   Procedure: LOWER EXTREMITY ANGIOGRAPHY;  Surgeon: Algernon Huxley, MD;  Location: [facility];  Service: Cardiovascular;  Laterality: Left;    Prior to Admission medications   Medication Sig Start Date End Date Taking? Authorizing Provider  acetaminophen (TYLENOL) 325 MG tablet Take 650 mg by mouth every 4 (four) hours as needed for mild pain or moderate pain.   Yes [provider]  alum & mag hydroxide-simeth (MAALOX/MYLANTA) 200-200-20 MG/5ML suspension Take 30 mLs by mouth 4 (four) times daily as needed for indigestion or heartburn.   Yes [provider]  atorvastatin (LIPITOR) 20 MG tablet Take 20 mg by mouth at bedtime.   Yes [provider]  diphenhydrAMINE (BENADRYL) 25 MG tablet Take 25 mg by mouth every 6 (six) hours as needed for allergies.   Yes [provider]  ferrous sulfate 325 (65 FE) MG tablet Take 325 mg by mouth daily with breakfast.   Yes [provider]  fluticasone (FLONASE) 50 MCG/ACT nasal spray Place 2 sprays into both nostrils daily as needed for allergies.   Yes [provider]  gabapentin (NEURONTIN) 300  MG capsule Take 300 mg by mouth 2 (two) times daily.   Yes [provider]  glipiZIDE (GLUCOTROL) 10 MG tablet Take 10 mg by mouth daily. 12/04/20  Yes [provider]  guaifenesin (ROBITUSSIN) 100 MG/5ML syrup Take 300 mg by mouth every 6 (six) hours as needed for cough.   Yes [provider]  hydrOXYzine (VISTARIL) 25 MG capsule Take 25 mg by mouth every 8 (eight) hours as needed for anxiety.   Yes [provider]  levothyroxine (SYNTHROID, LEVOTHROID) 125 MCG  tablet Take 1 tablet (125 mcg total) by mouth daily before breakfast. 04/03/18  Yes Mody, Sital, MD  loperamide (IMODIUM A-D) 2 MG tablet Take 2 mg by mouth as needed for diarrhea or loose stools. (max 8 doses in 24 hours)   Yes [provider]  magnesium hydroxide (MILK OF MAGNESIA) 400 MG/5ML suspension Take 30 mLs by mouth daily as needed for mild constipation or moderate constipation.   Yes [provider]  ondansetron (ZOFRAN ODT) 4 MG disintegrating tablet Take 1 tablet (4 mg total) by mouth every 8 (eight) hours as needed for nausea or vomiting. 04/06/21  Yes Blake Divine, MD  sennosides-docusate sodium (SENOKOT-S) 8.6-50 MG tablet Take 2 tablets by mouth at bedtime.   Yes [provider]  Skin Protectants, Misc. (EUCERIN) cream Apply 1 application topically 2 (two) times daily. (apply to feet and legs)   Yes [provider]  tamsulosin (FLOMAX) 0.4 MG CAPS capsule Take 1 capsule (0.4 mg total) by mouth daily. 04/03/18  Yes Bettey Costa, MD  zinc gluconate 50 MG tablet Take 100 mg by mouth daily.   Yes [provider]  enoxaparin (LOVENOX) 40 MG/0.4ML injection Inject into the skin. 07/15/20 08/12/20  [provider]    Allergies Penicillins  Family History  Problem Relation Age of Onset  . Diabetes Mother   . Diabetes Father   . Alzheimer's disease Father   . Breast cancer Neg Hx     Social History Social History   Tobacco Use  . Smoking status: Former Research scientist (life sciences)  . Smokeless tobacco: Never Used  Vaping Use  . Vaping Use: Never used  Substance Use Topics  . Alcohol use: No  . Drug use: No    Review of Systems  Constitutional: No fever/chills Eyes: No visual changes. ENT: No sore throat. Cardiovascular: Denies chest pain. Respiratory: Denies shortness of breath. Gastrointestinal: Positive for abdominal pain and nausea, no vomiting.  No diarrhea.  No constipation. Genitourinary: Negative for dysuria. Musculoskeletal:  Negative for back pain. Skin: Negative for rash. Neurological: Negative for headaches, focal weakness or numbness.  ____________________________________________   PHYSICAL EXAM:  VITAL SIGNS: ED Triage Vitals [04/05/21 1813]  Enc Vitals Group     BP 126/87     Pulse Rate 95     Resp 16     Temp 98.3 F (36.8 C)     Temp Source Oral     SpO2 97 %     Weight 217 lb (98.4 kg)     Height 5\' 4"  (1.626 m)     Head Circumference      Peak Flow      Pain Score 9     Pain Loc      Pain Edu?      Excl. in [place]?     Constitutional: Alert and oriented. Eyes: Conjunctivae are normal. Head: Atraumatic. Nose: No congestion/rhinnorhea. Mouth/Throat: Mucous membranes are moist. Neck: Normal ROM Cardiovascular: Normal rate, regular rhythm.  Grossly normal heart sounds. Respiratory: Normal respiratory effort.  No retractions. Lungs CTAB. Gastrointestinal: Soft and diffusely tender to palpation with no rebound or guarding. No distention. Genitourinary: deferred Musculoskeletal: No lower extremity tenderness nor edema. Neurologic:  Normal speech and language. No gross focal neurologic deficits are appreciated. Skin:  Skin is warm, dry and intact. No rash noted. Psychiatric: Mood and affect are normal. Speech and behavior are normal.  ____________________________________________   LABS (all labs ordered are listed, but only abnormal results are displayed)  Labs Reviewed  LIPASE, BLOOD - Abnormal; Notable for the following components:      Result Value   Lipase 56 (*)    All other components within normal limits  COMPREHENSIVE METABOLIC PANEL - Abnormal; Notable for the following components:   Glucose, Bld 124 (*)    BUN 39 (*)    Creatinine, Ser 1.65 (*)    Total Protein 8.4 (*)    Albumin 3.3 (*)    Alkaline Phosphatase 200 (*)    GFR, Estimated 33 (*)    All other components within normal limits  CBC - Abnormal; Notable for the following components:   RBC 3.67 (*)     Hemoglobin 10.3 (*)    HCT 32.1 (*)    All other components within normal limits  URINALYSIS, COMPLETE (UACMP) WITH MICROSCOPIC - Abnormal; Notable for the following components:   Color, Urine YELLOW (*)    APPearance CLOUDY (*)    Ketones, ur 5 (*)    Leukocytes,Ua LARGE (*)    Bacteria, UA RARE (*)    All other components within normal limits  CANCER ANTIGEN 19-9  CANCER ANTIGEN 27.29  CA 125  CEA    PROCEDURES  Procedure(s) performed (including Critical Care):  Procedures   ____________________________________________   INITIAL IMPRESSION / ASSESSMENT AND PLAN / ED COURSE       70 year old female with past medical history of hypertension, hyperlipidemia, diabetes, and asthma who presents to the ED complaining of 2 months of gradually worsening abdominal pain associate with nausea and poor appetite.  She has diffuse tenderness on exam, also noted to have mildly elevated lipase and alkaline phosphatase.  Bilirubin within normal limits and I doubt biliary obstruction.  Symptoms were further assessed with CT scan, which showed diffuse hepatic metastatic disease along with metastatic disease of her abdominal wall.  No obvious primary noted on CT results.  Case discussed with Dr. Grayland Ormond of oncology, who will order tumor markers and recommends proceeding with CT chest.  CT chest will need to be without contrast given patient with poor renal function and already received dose of contrast earlier.  CT chest shows possible diffuse metastatic lesions versus sequela of granulomatous disease, no primary lesion identified.  Tumor markers are pending at this time and patient is appropriate for discharge home with oncology follow-up on Tuesday.  She was counseled to return to the ED for new worsening symptoms, patient agrees with plan.      ____________________________________________   FINAL CLINICAL IMPRESSION(S) / ED DIAGNOSES  Final diagnoses:  Metastatic malignant neoplasm,  unspecified site [facility])  Generalized abdominal pain     ED Discharge Orders         Ordered    ondansetron (ZOFRAN ODT) 4 MG disintegrating tablet  Every 8 hours PRN        04/06/21 0034           Note:  This document was prepared using Dragon voice recognition software and may include unintentional  dictation errors.   Blake Divine, MD 04/06/21 [id number]

## 2021-04-05 NOTE — ED Triage Notes (Addendum)
Pt to ER via [facility] from [facility]. Pt reports abdominal pain since ferbruary, reports generalized abdominal cramping. Pt also reports pain when having a bowel movement. States that he stools have been harder than usual. Reports burping and GERD. Reports not liking the food at her facility and having a hard time eating while there.

## 2021-04-06 ENCOUNTER — Emergency Department: Payer: Medicare Other

## 2021-04-06 DIAGNOSIS — R1084 Generalized abdominal pain: Secondary | ICD-10-CM | POA: Diagnosis not present

## 2021-04-06 MED ORDER — ONDANSETRON 4 MG PO TBDP: 4.0000 mg | ORAL_TABLET | Freq: Three times a day (TID) | ORAL | 0 refills | Status: AC | PRN

## 2021-04-07 LAB — CANCER ANTIGEN 19-9: CA 19-9: 23 U/mL (ref 0–35)

## 2021-04-07 LAB — CANCER ANTIGEN 27.29: CA 27.29: 98.6 U/mL — ABNORMAL HIGH (ref 0.0–38.6)

## 2021-04-07 LAB — CEA: CEA: 9.2 ng/mL — ABNORMAL HIGH (ref 0.0–4.7)

## 2021-04-07 LAB — CA 125: Cancer Antigen (CA) 125: 177 U/mL — ABNORMAL HIGH (ref 0.0–38.1)

## 2021-04-12 ENCOUNTER — Inpatient Hospital Stay: Payer: Medicare Other | Admitting: Oncology

## 2021-04-13 NOTE — Progress Notes (Signed)
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  INTERVAL HISTORY: Patient is a 70 year old female who previously was diagnosed with a noninvasive carcinosarcoma of the uterus and is status post hysterectomy.  She recently presented to the emergency room with increasing abdominal pain and noted to have widespread peritoneal and liver metastasis.  She currently feels well.  She has no neurologic complaints.  She denies any recent fevers or illnesses.  She has a fair appetite, but denies weight loss.  She has no chest pain, shortness of breath, cough, or hemoptysis.  She denies any nausea, vomiting, constipation, or diarrhea.  She has no urinary complaints.  Patient offers no further specific complaints today.  REVIEW OF SYSTEMS:   Review of Systems  Constitutional: Negative.  Negative for fever, malaise/fatigue and weight loss.  Respiratory: Negative.  Negative for cough, hemoptysis and shortness of breath.   Cardiovascular: Negative.  Negative for chest pain and leg swelling.  Gastrointestinal: Positive for abdominal pain. Negative for nausea.  Genitourinary: Negative.  Negative for dysuria.  Musculoskeletal: Positive for back pain.  Skin: Negative.  Negative for rash.  Neurological: Negative.  Negative for dizziness, focal weakness, weakness and headaches.  Psychiatric/Behavioral: Negative.  The patient is not nervous/anxious.     As per HPI. Otherwise, a complete review of systems is negative.  PAST MEDICAL HISTORY: Past Medical History:  Diagnosis Date  .  Adult failure to thrive   . Aortic atherosclerosis ([place])   . Asthma   . Carcinosarcoma of endometrium ([place])   . Cellulitis of left foot   . Cellulitis of right foot   . Diabetes mellitus without complication ([place])   . Glaucoma   . HLD (hyperlipidemia)   . Hypertension   . Protein calorie malnutrition ([place])   . Stage 2 skin ulcer of sacral region ([place])   . Thyroid disease    hypothyroid  . Urinary retention     PAST SURGICAL HISTORY: Past Surgical History:  Procedure Laterality Date  . APPENDECTOMY    . LOWER EXTREMITY ANGIOGRAPHY Left 01/04/2019   Procedure: LOWER EXTREMITY ANGIOGRAPHY;  Surgeon: Algernon Huxley, MD;  Location: [facility];  Service: Cardiovascular;  Laterality: Left;    FAMILY HISTORY: Family History  Problem Relation Age of Onset  . Diabetes Mother   . Diabetes Father   . Alzheimer's disease Father   . Breast cancer Neg Hx     ADVANCED DIRECTIVES (Y/N):  N  HEALTH MAINTENANCE: Social History   Tobacco Use  . Smoking status: Former Research scientist (life sciences)  . Smokeless tobacco: Never Used  Vaping Use  . Vaping Use: Never used  Substance Use Topics  . Alcohol use: No  . Drug use: No     Colonoscopy:  PAP:  Bone density:  Lipid panel:  Allergies  Allergen Reactions  . Penicillins Swelling    Has patient had a PCN reaction causing immediate rash, facial/tongue/throat swelling, SOB or lightheadedness with hypotension: Unknown Has patient had a PCN reaction causing severe rash involving mucus membranes or skin necrosis: Unknown Has patient had a PCN reaction that required hospitalization: Unknown Has  patient had a PCN reaction occurring within the last 10 years: Unknown If all of the above answers are "NO", then may proceed with Cephalosporin use.     Current Outpatient Medications  Medication Sig Dispense Refill  . acetaminophen (TYLENOL) 325 MG tablet Take 650 mg by mouth every 4 (four) hours as needed for mild pain or moderate pain.    Marland Kitchen alum &  mag hydroxide-simeth (MAALOX/MYLANTA) 200-200-20 MG/5ML suspension Take 30 mLs by mouth 4 (four) times daily as needed for indigestion or heartburn.    Marland Kitchen atorvastatin (LIPITOR) 20 MG tablet Take 20 mg by mouth at bedtime.    . diphenhydrAMINE (BENADRYL) 25 MG tablet Take 25 mg by mouth every 6 (six) hours as needed for allergies.    . ferrous sulfate 325 (65 FE) MG tablet Take 325 mg by mouth daily with breakfast.    . gabapentin (NEURONTIN) 300 MG capsule Take 300 mg by mouth 2 (two) times daily.    Marland Kitchen glipiZIDE (GLUCOTROL) 10 MG tablet Take 10 mg by mouth daily.    Marland Kitchen guaifenesin (ROBITUSSIN) 100 MG/5ML syrup Take 300 mg by mouth every 6 (six) hours as needed for cough.    . hydrOXYzine (VISTARIL) 25 MG capsule Take 25 mg by mouth every 8 (eight) hours as needed for anxiety.    Marland Kitchen levothyroxine (SYNTHROID, LEVOTHROID) 125 MCG tablet Take 1 tablet (125 mcg total) by mouth daily before breakfast. 30 tablet 0  . loperamide (IMODIUM A-D) 2 MG tablet Take 2 mg by mouth as needed for diarrhea or loose stools. (max 8 doses in 24 hours)    . magnesium hydroxide (MILK OF MAGNESIA) 400 MG/5ML suspension Take 30 mLs by mouth daily as needed for mild constipation or moderate constipation.    . ondansetron (ZOFRAN ODT) 4 MG disintegrating tablet Take 1 tablet (4 mg total) by mouth every 8 (eight) hours as needed for nausea or vomiting. 12 tablet 0  . pantoprazole (PROTONIX) 40 MG tablet Take 1 tablet by mouth daily.    . sennosides-docusate sodium (SENOKOT-S) 8.6-50 MG tablet Take 2 tablets by mouth at bedtime.    . Skin Protectants, Misc. (EUCERIN) cream Apply 1 application topically 2 (two) times daily. (apply to feet and legs)    . tamsulosin (FLOMAX) 0.4 MG CAPS capsule Take 1 capsule (0.4 mg total) by mouth daily. 30 capsule 0  . zinc gluconate 50 MG tablet Take 100 mg by mouth daily.    Marland Kitchen enoxaparin (LOVENOX) 40 MG/0.4ML injection Inject into the skin.    . fluticasone (FLONASE) 50 MCG/ACT nasal spray  Place 2 sprays into both nostrils daily as needed for allergies.     No current facility-administered medications for this visit.    OBJECTIVE: Vitals:   04/18/21 0949  BP: 106/66  Pulse: 83  Resp: 18  Temp: (!) 97.3 F (36.3 C)  SpO2: 100%     Body mass index is 35.57 kg/m.    ECOG FS:1 - Symptomatic but completely ambulatory  General: Well-developed, well-nourished, no acute distress.  Sitting in a wheelchair. Eyes: Pink conjunctiva, anicteric sclera. HEENT: Normocephalic, moist mucous membranes. Lungs: No audible wheezing or coughing. Heart: Regular rate and rhythm. Abdomen: Soft, nontender, no obvious distention. Musculoskeletal: No edema, cyanosis, or clubbing. Neuro: Alert, answering all questions appropriately. Cranial nerves grossly intact. Skin: No rashes or petechiae noted. Psych: Normal affect. Lymphatics: No cervical, calvicular, axillary or inguinal LAD.   LAB RESULTS:  Lab Results  Component Value Date   NA 138  04/05/2021   K 4.4 04/05/2021   CL 105 04/05/2021   CO2 22 04/05/2021   GLUCOSE 124 (H) 04/05/2021   BUN 39 (H) 04/05/2021   CREATININE 1.65 (H) 04/05/2021   CALCIUM 9.3 04/05/2021   PROT 8.4 (H) 04/05/2021   ALBUMIN 3.3 (L) 04/05/2021   AST 41 04/05/2021   ALT 21 04/05/2021   ALKPHOS 200 (H) 04/05/2021   BILITOT 0.6 04/05/2021   GFRNONAA 33 (L) 04/05/2021   GFRAA 54 (L) 07/10/2020    Lab Results  Component Value Date   WBC 9.4 04/05/2021   NEUTROABS 4.9 06/28/2020   HGB 10.3 (L) 04/05/2021   HCT 32.1 (L) 04/05/2021   MCV 87.5 04/05/2021   PLT 314 04/05/2021     STUDIES: CT CHEST WO CONTRAST  Result Date: 04/06/2021 CLINICAL DATA:  Cancer of unknown primary, multiple hepatic metastases EXAM: CT CHEST WITHOUT CONTRAST TECHNIQUE: Multidetector CT imaging of the chest was performed following the standard protocol without IV contrast. COMPARISON:  Same-day CT abdomen pelvis, CT chest 07/10/2020 FINDINGS: Cardiovascular: Limited  assessment in the absence of contrast media. Normal heart size. No pericardial effusion. Coronary artery calcifications are present. Atherosclerotic plaque within the normal caliber aorta. Minimal plaque in the proximal great vessels which are otherwise unremarkable. No major venous abnormality is seen. Central pulmonary arteries are top-normal caliber. Mediastinum/Nodes: There are numerous partially calcified mediastinal and hilar lymph nodes including larger partially calcified paratracheal lymph node (2/55) measuring up to 18 mm short axis. A large partially calcified lymph node in the right hilum measures up to 14 mm short axis (2/56) additional hilar adenopathy is difficult fully assess in the absence of intravenous contrast media. No acute abnormality of the trachea or esophagus. Thyroid gland is unremarkable. Lungs/Pleura: There are 3 separate partially calcified nodule seen along the left major fissure measuring between 5-7 mm in size (3/52, 57, 61) which could reflect partially calcified intrapulmonary lymph nodes given this location. Additional Peri fissural nodule measuring 4 mm seen in the right lower lobe (3/77) some bandlike regions of scarring are seen in the medial left upper lobe with some slight architectural distortion bronchiectatic change as well as a flattened, part calcified nodule measuring up to 6 mm (3/83). Few Peri vascular nodules are noted in the right lower lobe as well (3/83, 93) measuring up to 3 mm in size. No consolidative process. No pneumothorax or visible effusion. Upper Abdomen: Multiple hepatic lesions and cirrhotic configuration of the liver are similar to prior. Small amount of ascites. Redemonstrated hydronephrosis of a left renal moiety. Musculoskeletal: The osseous structures appear diffusely demineralized which may limit detection of small or nondisplaced fractures. Multilevel degenerative changes are present in the imaged portions of the spine. No acute osseous  abnormality or suspicious osseous lesion. No worrisome or suspicious lytic or blastic lesions are identified. No worrisome chest wall masses or lesions. Mild body wall edema. IMPRESSION: 1. Multiple partially calcified mediastinal and hilar lymph nodes, as well as multiple solid and partially calcified nodules throughout both lungs predominantly in a perilymphatic distribution. Given the interval stability from comparison CT in 2021, stable sequela of prior granulomatous disease is favored though certainly given the extent of this finding and the appearance of the abdomen, smaller metastatic adenopathy could be obscured particularly on this noncontrast CT. 2. Redemonstration of the cirrhotic configuration liver with numerous metastases and upper abdominal ascites. 3. Hydronephrosis of the left renal moiety. 4. Aortic Atherosclerosis (ICD10-I70.0). These results were called by telephone at the time of  interpretation on 04/06/2021 at 12:39 am to provider [facility] , who verbally acknowledged these results. Electronically Signed   By: Lovena Le M.D.   On: 04/06/2021 00:42   CT Abdomen Pelvis W Contrast  Result Date: 04/05/2021 CLINICAL DATA:  Abdominal pain. EXAM: CT ABDOMEN AND PELVIS WITH CONTRAST TECHNIQUE: Multidetector CT imaging of the abdomen and pelvis was performed using the standard protocol following bolus administration of intravenous contrast. CONTRAST:  17mL OMNIPAQUE IOHEXOL 300 MG/ML  SOLN COMPARISON:  June 29, 2020 FINDINGS: Lower chest: Mild atelectasis is seen within the bilateral lung bases. Hepatobiliary: The liver is cirrhotic in appearance. Innumerable heterogeneous low-attenuation liver lesions are seen. This represents a new finding when compared to the prior exam. No gallstones, gallbladder wall thickening, or biliary dilatation. Pancreas: Unremarkable. No pancreatic ductal dilatation or surrounding inflammatory changes. Spleen: Normal in size without focal abnormality.  Adrenals/Urinary Tract: Adrenal glands are unremarkable. 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 Stable marked severity left-sided hydronephrosis is noted. Bladder is unremarkable. Stomach/Bowel: There is a small hiatal hernia. The appendix is surgically absent. No evidence of bowel dilatation. Noninflamed diverticula are seen throughout the large bowel. Vascular/Lymphatic: Aortic atherosclerosis. No enlarged abdominal or pelvic lymph nodes. Reproductive: The uterus is not clearly identified. Other: A 2.3 cm x 1.6 cm umbilical hernia is seen. This contains fat and a small amount of soft tissue attenuation. An extensive amount of soft tissue attenuation is seen throughout the mesentery of the anterolateral aspect of the left lower quadrant and left hemipelvis. This represents a new finding when compared to the prior study. There is a mild amount of abdominal and pelvic free fluid. Musculoskeletal: Multilevel degenerative changes seen throughout the lumbar spine. IMPRESSION: 1. Cirrhotic liver with additional findings consistent with diffuse hepatic metastasis. 2. Extensive peritoneal metastasis within the left lower quadrant and left hemipelvis. 3. 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 4. Small hiatal hernia. 5. Colonic diverticulosis. 6. Aortic atherosclerosis. Aortic Atherosclerosis (ICD10-I70.0). Electronically Signed   By: Virgina Norfolk M.D.   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  PLAN:    1.  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  Patient underwent hysterectomy several months ago for noninvasive carcinoma carcinoma.  This is likely recurrence, but will get an ultrasound-guided biopsy of liver for confirmation.  Patient will benefit from chemotherapy using carboplatin and Taxol.  Given her multiple comorbidities will consider dose reduction from the onset.  Return  to clinic 1 week after biopsy to discuss the results and treatment planning.  I spent a total of 60 minutes reviewing chart data, face-to-face evaluation with the patient, counseling and coordination of care as detailed above.   Patient expressed understanding and was in agreement with this plan. She also understands that She can call clinic at any time with any questions, concerns, or complaints.   Cancer Staging Carcinosarcoma of endometrium [facility]) Staging form: Corpus Uteri - Carcinoma and Carcinosarcoma, AJCC 8th Edition - Clinical stage from 04/19/2021: FIGO Stage IVB (cTX, cNX, cM1) - Signed by Lloyd Huger, MD on 04/19/2021 Stage prefix: Initial diagnosis   Lloyd Huger, MD   04/19/2021 6:26 AM

## 2021-04-18 ENCOUNTER — Inpatient Hospital Stay (HOSPITAL_BASED_OUTPATIENT_CLINIC_OR_DEPARTMENT_OTHER): Payer: Medicare Other | Admitting: Oncology

## 2021-04-18 ENCOUNTER — Other Ambulatory Visit: Payer: Self-pay

## 2021-04-18 ENCOUNTER — Encounter: Payer: Self-pay | Admitting: Oncology

## 2021-04-18 ENCOUNTER — Inpatient Hospital Stay: Payer: Medicare Other

## 2021-04-18 ENCOUNTER — Inpatient Hospital Stay: Payer: Medicare Other | Attending: Obstetrics and Gynecology | Admitting: Obstetrics and Gynecology

## 2021-04-18 VITALS — BP 106/66 | HR 83 | Temp 97.3°F | Resp 18 | Wt 207.0 lb

## 2021-04-18 VITALS — BP 106/66 | HR 83 | Temp 97.3°F | Resp 18 | Ht 64.0 in | Wt 207.2 lb

## 2021-04-18 DIAGNOSIS — C787 Secondary malignant neoplasm of liver and intrahepatic bile duct: Secondary | ICD-10-CM | POA: Insufficient documentation

## 2021-04-18 DIAGNOSIS — Z87891 Personal history of nicotine dependence: Secondary | ICD-10-CM | POA: Insufficient documentation

## 2021-04-18 DIAGNOSIS — C786 Secondary malignant neoplasm of retroperitoneum and peritoneum: Secondary | ICD-10-CM

## 2021-04-18 DIAGNOSIS — Z79899 Other long term (current) drug therapy: Secondary | ICD-10-CM

## 2021-04-18 DIAGNOSIS — C541 Malignant neoplasm of endometrium: Secondary | ICD-10-CM

## 2021-04-18 DIAGNOSIS — Z7984 Long term (current) use of oral hypoglycemic drugs: Secondary | ICD-10-CM

## 2021-04-18 DIAGNOSIS — K5900 Constipation, unspecified: Secondary | ICD-10-CM | POA: Insufficient documentation

## 2021-04-18 DIAGNOSIS — C801 Malignant (primary) neoplasm, unspecified: Secondary | ICD-10-CM | POA: Insufficient documentation

## 2021-04-18 DIAGNOSIS — E1165 Type 2 diabetes mellitus with hyperglycemia: Secondary | ICD-10-CM | POA: Insufficient documentation

## 2021-04-18 DIAGNOSIS — Z833 Family history of diabetes mellitus: Secondary | ICD-10-CM | POA: Diagnosis not present

## 2021-04-18 DIAGNOSIS — E039 Hypothyroidism, unspecified: Secondary | ICD-10-CM

## 2021-04-18 DIAGNOSIS — I10 Essential (primary) hypertension: Secondary | ICD-10-CM | POA: Insufficient documentation

## 2021-04-18 DIAGNOSIS — K219 Gastro-esophageal reflux disease without esophagitis: Secondary | ICD-10-CM | POA: Insufficient documentation

## 2021-04-18 NOTE — Progress Notes (Signed)
Pt complains of pain around her tailbone area. She does have a gyn appointment today also.

## 2021-04-18 NOTE — Progress Notes (Signed)
Gynecologic Oncology Interval Visit   Referring Provider: Boykin Nearing, MD [address] [facility] [place],  [place] [address] [phone]  Chief Concern: endometrial cancer with new concern for recurrence  Subjective:  [name] is a 70 y.o. G0P0 female, initially seen in consultation from Dr. Ouida Sills for carcinosarcoma of the uterus.   04/05/21 CT scan in ED for abdominal pain since February  IMPRESSION: 1. Cirrhotic liver with additional findings consistent with diffuse hepatic metastasis. 2. Extensive peritoneal metastasis within the left lower quadrant and left hemipelvis. 3. 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 4. Small hiatal hernia. 5. Colonic diverticulosis. 6. Aortic atherosclerosis.   Gynecologic Oncology History:  She presented with postmenopausal bleeding which started in April. She has continued to have bleeding which continues every other day. Evaluation included the following:  06/15/2020 EMBX : CARCINOSARCOMA (MALIGNANT MIXED MULLERIAN TUMOR). Uterus sounded to 9 cm.  Pap: Pap IG (Image Guided) -  Comment: EPITHELIAL CELL ABNORMALITY.  ATYPICAL GLANDULAR CELLS (AGC).   CT abdomen/pelvis scan has been ordered by Dr. Ouida Sills and is scheduled 06/29/2020.   Prior CT scan 09/22/2019 IMPRESSION: 1. 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 Left sided hydronephrosis is seen to the level of the ureteropelvic junction, as on 07/08/2018.  2. Right renal stone. 3. Advanced left hip osteoarthritis. 4.  Aortic atherosclerosis (ICD10-170.0).  She has multiple medical issues including HTN, malnutrition, type 2 DM (with h/o AKI and diabetic infections), aortic atherosclerosis, chronic venous insufficiency with severe lymphedema, and hyperlipidemia. She has had a prior ECHO 01/24/2017 which revealed normal function with mild LVH.   INTERPRETATION ECHO NORMAL LEFT VENTRICULAR SYSTOLIC  FUNCTION WITH MILD LVH  NORMAL RIGHT VENTRICULAR SYSTOLIC FUNCTION  MILD VALVULAR REGURGITATION (See above)  NO VALVULAR STENOSIS  EF 50-55%  Closest EF: >55% (Estimated)  LVH: MILDLVH  Mitral: MILD MR   She lives in [facility] facility.    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   Pathology A.  Left external iliac sentinel lymph node, biopsy: One lymph node, negative for malignancy (0/1), with nonnecrotizing granulomatous inflammation. Acid fast and fungal stains (Gomori methenamine silver) on block A1 are negative. B.  Right external iliac sentinel lymph node, biopsy: One lymph node, negative for malignancy (0/1), with nonnecrotizing granulomatous inflammation. C.  Uterus with bilateral ovaries and fallopian tubes, hysterectomy and bilateral salpingo-oophorectomy: Endometrioid adenocarcinoma of the endometrium with squamous differentiation (5.1 cm), FIGO grade 2 of 3, see comment. Tumor is limited to the endometrium; no myometrial invasion is seen. Remaining myometrium: No pathologic diagnosis. Cervix: Negative for malignancy. Serosa: Negative for malignancy. Left ovary and Fallopian tube: Negative for malignancy. Right Fallopian tube: Negative for malignancy. Right ovary: Not identified.  Washing negative.  Mismatch repair: INTACT A. Immunohistochemistry: Normal result. Expression of MLH1, MSH2, MSH6, and PMS2 is retained in the neoplastic cells. B. PCR: Microsatellite stable (MSS).  Case presented at Tumor Board on 07/31/20 at [facility]. Consensus was to have slides reviewed from endometrial biopsy and if carcinosarcoma is confirmed in the endometrial biopsy, would treat as carcinosarcoma even though there is no residual carcinosarcoma in hysterectomy specimen. Recommend chemo/VBT. If [facility] review did not reveal  carcinosarcoma in biopsy then recommend treating as IA, grade 2 endometrioid adenocarcinoma of th endometrium and can offer observation vs VBT.   Review of EMB at [facility] did  not show carcinosarcoma, so no adjuvant therapy recommended in view of non invasive tumor.    She has seen palliative care outpatient.   Based on documentation she had a Chest CT on 06/2020 with the findings notes below. She will continue follow up at [facility] as scheduled. No further evaluation is needed at this time. Repeat imaging may be considered at future follow up visits.  IMPRESSION: 1. Changes of previous granulomatous infection with left lung calcified granulomata and calcified and noncalcified chronic mediastinal and bilateral hilar adenopathy. 2. Previously demonstrated horseshoe kidney with chronic left UPJ obstruction. 3. Calcific coronary artery and aortic atherosclerosis.  Problem List: Patient Active Problem List   Diagnosis Date Noted  . Constipation 04/18/2021  . Gastro-esophageal reflux disease without esophagitis 04/18/2021  . Hyperglycemia due to type 2 diabetes mellitus ([place]) 04/18/2021  . Endometrial cancer ([place]) 07/13/2020  . Carcinosarcoma of endometrium ([place]) 06/22/2020  . Palliative care encounter 01/06/2019  . Localized edema 01/06/2019  . Shortness of breath 01/06/2019  . Hypertension 08/25/2018  . Cellulitis of lower extremity 04/06/2018  . Mild protein-calorie malnutrition ([place]) 04/06/2018  . Pressure injury of skin 03/27/2018  . Sepsis ([place]) 03/26/2018  . Diabetic infection of left foot ([place]) 03/26/2018  . AKI (acute kidney injury) ([place]) 03/26/2018  . Venous ulcer of left leg ([place]) 11/18/2016  . Chronic venous insufficiency 11/18/2016  . Lymphedema 11/18/2016  . Swelling of limb 11/18/2016  . Type 2 diabetes mellitus with insulin therapy ([place]) 11/18/2016  . Hyperlipidemia 11/18/2016    Past Medical History: Past Medical History:  Diagnosis Date  . Adult failure to thrive   .  Aortic atherosclerosis ([place])   . Asthma   . Carcinosarcoma of endometrium ([place])   . Cellulitis of left foot   . Cellulitis of right foot   . Diabetes mellitus without complication ([place])   . Glaucoma   . HLD (hyperlipidemia)   . Hypertension   . Protein calorie malnutrition ([place])   . Stage 2 skin ulcer of sacral region ([place])   . Thyroid disease    hypothyroid  . Urinary retention     Past Surgical History: Past Surgical History:  Procedure Laterality Date  . APPENDECTOMY    . LOWER EXTREMITY ANGIOGRAPHY Left 01/04/2019   Procedure: LOWER EXTREMITY ANGIOGRAPHY;  Surgeon: Algernon Huxley, MD;  Location: [facility] [facility];  Service: Cardiovascular;  Laterality: Left;    Past Gynecologic History:  Menarche: unknown, never had a period per her report Menstrual details: as above History of OCP/HRT use: no History of Abnormal pap: as per HPI  Last pap: as per HPI History of STDs: The patient denies history of sexually transmitted disease.  OB History: G0P0 OB History  No obstetric history on file.    Family History: Family History  Problem Relation Age of Onset  . Diabetes Mother   . Diabetes Father   . Alzheimer's disease Father   . Breast cancer Neg Hx     Social History: Social History   Socioeconomic History  . Marital status: Single    Spouse name: Not on file  . Number of children: Not on file  . Years of education: Not on file  . Highest education level: Not on file  Occupational History  . 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 Smoking status: Former Research scientist (life sciences)  . Smokeless tobacco: Never Used  Vaping Use  . Vaping Use: Never used  Substance and Sexual  Activity  . Alcohol use: No  . Drug use: No  . Sexual activity: Not on file  Other Topics Concern  . Not on file  Social History Narrative  . Not on file   Social Determinants of Health   Financial Resource Strain: Not on file  Food Insecurity: Not on file   Transportation Needs: Not on file  Physical Activity: Not on file  Stress: Not on file  Social Connections: Not on file  Intimate Partner Violence: Not on file    Allergies: Allergies  Allergen Reactions  . Penicillins Swelling    Has patient had a PCN reaction causing immediate rash, facial/tongue/throat swelling, SOB or lightheadedness with hypotension: Unknown Has patient had a PCN reaction causing severe rash involving mucus membranes or skin necrosis: Unknown Has patient had a PCN reaction that required hospitalization: Unknown Has patient had a PCN reaction occurring within the last 10 years: Unknown If all of the above answers are "NO", then may proceed with Cephalosporin use.     Current Medications: Current Outpatient Medications  Medication Sig Dispense Refill  . acetaminophen (TYLENOL) 325 MG tablet Take 650 mg by mouth every 4 (four) hours as needed for mild pain or moderate pain.    Marland Kitchen alum & mag hydroxide-simeth (MAALOX/MYLANTA) 200-200-20 MG/5ML suspension Take 30 mLs by mouth 4 (four) times daily as needed for indigestion or heartburn.    Marland Kitchen atorvastatin (LIPITOR) 20 MG tablet Take 20 mg by mouth at bedtime.    . diphenhydrAMINE (BENADRYL) 25 MG tablet Take 25 mg by mouth every 6 (six) hours as needed for allergies.    Marland Kitchen enoxaparin (LOVENOX) 40 MG/0.4ML injection Inject into the skin.    . ferrous sulfate 325 (65 FE) MG tablet Take 325 mg by mouth daily with breakfast.    . fluticasone (FLONASE) 50 MCG/ACT nasal spray Place 2 sprays into both nostrils daily as needed for allergies.    Marland Kitchen gabapentin (NEURONTIN) 300 MG capsule Take 300 mg by mouth 2 (two) times daily.    Marland Kitchen glipiZIDE (GLUCOTROL) 10 MG tablet Take 10 mg by mouth daily.    Marland Kitchen guaifenesin (ROBITUSSIN) 100 MG/5ML syrup Take 300 mg by mouth every 6 (six) hours as needed for cough.    . hydrOXYzine (VISTARIL) 25 MG capsule Take 25 mg by mouth every 8 (eight) hours as needed for anxiety.    Marland Kitchen levothyroxine  (SYNTHROID, LEVOTHROID) 125 MCG tablet Take 1 tablet (125 mcg total) by mouth daily before breakfast. 30 tablet 0  . loperamide (IMODIUM A-D) 2 MG tablet Take 2 mg by mouth as needed for diarrhea or loose stools. (max 8 doses in 24 hours)    . magnesium hydroxide (MILK OF MAGNESIA) 400 MG/5ML suspension Take 30 mLs by mouth daily as needed for mild constipation or moderate constipation.    . ondansetron (ZOFRAN ODT) 4 MG disintegrating tablet Take 1 tablet (4 mg total) by mouth every 8 (eight) hours as needed for nausea or vomiting. 12 tablet 0  . pantoprazole (PROTONIX) 40 MG tablet Take 1 tablet by mouth daily.    . sennosides-docusate sodium (SENOKOT-S) 8.6-50 MG tablet Take 2 tablets by mouth at bedtime.    . Skin Protectants, Misc. (EUCERIN) cream Apply 1 application topically 2 (two) times daily. (apply to feet and legs)    . tamsulosin (FLOMAX) 0.4 MG CAPS capsule Take 1 capsule (0.4 mg total) by mouth daily. 30 capsule 0  . zinc gluconate 50 MG tablet Take 100 mg by mouth  daily.     No current facility-administered medications for this visit.   Review of Systems General:  no complaints Skin: no complaints Eyes: no complaints HEENT: no complaints Breasts: no complaints Pulmonary: no complaints Cardiac: no complaints Gastrointestinal: no complaints Genitourinary/Sexual: no complaints Musculoskeletal: no complaints Hematology: no complaints Neurologic/Psych: no complaints  Objective:  Physical Examination:  Today's Vitals   04/18/21 1017  BP: 106/66  Pulse: 83  Resp: 18  Temp: (!) 97.3 F (36.3 C)  SpO2: 100%  Weight: 207 lb (93.9 kg)   Body mass index is 35.53 kg/m.  BP 106/66   Pulse 83   Temp (!) 97.3 F (36.3 C)   Resp 18   Wt 207 lb (93.9 kg)   LMP  (LMP Unknown)   SpO2 100%   BMI 35.53 kg/m    ECOG Performance Status: 2 - Symptomatic, <50% confined to bed  GENERAL: Patient is a well appearing female in no acute distress. Comes from assisted living  facility. Aid accompanies patient HEENT:  Sclera clear. Anicteric NODES:  Negative axillary, supraclavicular, inguinal lymph node survery LUNGS:  Clear to auscultation bilaterally.   HEART:  Regular rate and rhythm.  ABDOMEN:  Soft, nontender.  No hernias, incisions well healed. No masses or ascites EXTREMITIES: Bilateral lower extremity edema and severe chronic vascular skin changes bilaterally.  SKIN:  Clear with no obvious rashes or skin changes.  NEURO:  Nonfocal. Well oriented.  Appropriate affect.  Pelvic: exam chaperoned by nurse;  Vulva: normal appearing vulva with no masses, tenderness or lesions; Vagina: normal vagina; narrow caliber, Bimanual: thickening and nodularity in pelvis above vagina consistent with recurrent disease. Rectal: not indicated  Lab Review No labs on site today.  Radiologic Imaging:  see above    Assessment:  [name] is a 70 y.o. P0 female diagnosed withIn carcinosarcoma of the uterus on endometrial biopsy.  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  No carcinosarcoma was noted.  Review of the original EMB at [facility] did not confirm carcinosarcoma, so no adjuvant therapy.    Now with abdominal pain and CT consistent with diffuse hepatic metastasis. Extensive peritoneal metastasis within the left lower quadrant and left hemipelvis.  Lives in assisted living due to poor medical condition as noted below.  Microsatellite stable tumor.  Medical co-morbidities complicating care: including HTN, malnutrition, type 2 DM (with h/o AKI and diabetic infections), chronic venous insufficiency with severe lymphedema, and hyperlipidemia. She has had a prior ECHO 01/24/2017 which revealed normal function with mild LVH.  Plan:   Problem List Items Addressed This Visit      Genitourinary   Carcinosarcoma of endometrium [facility]) -  Primary      Discussed with Dr Grayland Ormond and he has ordered liver biopsy to confirm recurrence of endometrial cancer and to rule out a new cancer unrelated to her uterine cancer. If recurrence of uterine cancer confirmed would recommend carboplatin/taxol at reduced doses given her marginal performance status.  Check HER-2 expression on liver biopsy and consider adding trastuzamab to chemotherapy.  Would also assess ER/PR status to see if hormonal therapy would be an appealing option, and this would be better tolerated.    We can see her back in 3-4 months to assess progress of treatment based on CT scan monitoring.    Mellody Drown, MD  CC:  Schermerhorn, Gwen Her, MD [address]  [facility] [place],  [place] [address] [phone]

## 2021-04-19 ENCOUNTER — Telehealth: Payer: Self-pay | Admitting: Oncology

## 2021-04-19 ENCOUNTER — Telehealth: Payer: Self-pay | Admitting: *Deleted

## 2021-04-19 ENCOUNTER — Other Ambulatory Visit: Payer: Self-pay | Admitting: Radiology

## 2021-04-19 DIAGNOSIS — C801 Malignant (primary) neoplasm, unspecified: Secondary | ICD-10-CM | POA: Insufficient documentation

## 2021-04-19 NOTE — Telephone Encounter (Signed)
Patient is scheduled for US Guided Liver Biopsy on Monday 5/2 at 9:30. Patient resides at the [facility], facility made aware of appointment details.

## 2021-04-19 NOTE — Telephone Encounter (Signed)
Called the [facility] to schedule follow-up for patient the week of 5/9. Left VM at number forwarded to, for a return call to get patient scheduled.

## 2021-04-20 ENCOUNTER — Other Ambulatory Visit: Payer: Self-pay | Admitting: Radiology

## 2021-04-20 NOTE — Progress Notes (Signed)
Patient on schedule for Liver Biopsy 04/23/2021. Called and spoke with caregiver at [facility] in [place]. Made aware to be here @ 0830, NPO after [place] prior to procedure, and driver post procedure/recovery/discharge. Stated understanding.

## 2021-04-23 ENCOUNTER — Ambulatory Visit
Admission: RE | Admit: 2021-04-23 | Discharge: 2021-04-23 | Disposition: A | Discharge status: Home / Self Care | Payer: Medicare Other | Source: Ambulatory Visit | Attending: Oncology | Admitting: Oncology

## 2021-04-24 ENCOUNTER — Other Ambulatory Visit: Payer: Self-pay

## 2021-04-24 ENCOUNTER — Emergency Department: Payer: Medicare Other

## 2021-04-24 ENCOUNTER — Inpatient Hospital Stay
Admission: EM | Admit: 2021-04-24 | Discharge: 2021-04-30 | DRG: 641 | Disposition: A | Discharge status: Hospice | Payer: Medicare Other | Attending: Internal Medicine | Admitting: Internal Medicine

## 2021-04-24 DIAGNOSIS — Z20822 Contact with and (suspected) exposure to covid-19: Secondary | ICD-10-CM | POA: Diagnosis present

## 2021-04-24 DIAGNOSIS — N131 Hydronephrosis with ureteral stricture, not elsewhere classified: Secondary | ICD-10-CM | POA: Diagnosis present

## 2021-04-24 DIAGNOSIS — C541 Malignant neoplasm of endometrium: Secondary | ICD-10-CM | POA: Diagnosis present

## 2021-04-24 DIAGNOSIS — E860 Dehydration: Secondary | ICD-10-CM | POA: Diagnosis present

## 2021-04-24 DIAGNOSIS — Q631 Lobulated, fused and horseshoe kidney: Secondary | ICD-10-CM

## 2021-04-24 DIAGNOSIS — R109 Unspecified abdominal pain: Secondary | ICD-10-CM

## 2021-04-24 DIAGNOSIS — E875 Hyperkalemia: Secondary | ICD-10-CM | POA: Diagnosis not present

## 2021-04-24 DIAGNOSIS — R1084 Generalized abdominal pain: Secondary | ICD-10-CM

## 2021-04-24 DIAGNOSIS — E039 Hypothyroidism, unspecified: Secondary | ICD-10-CM | POA: Diagnosis present

## 2021-04-24 DIAGNOSIS — E785 Hyperlipidemia, unspecified: Secondary | ICD-10-CM | POA: Diagnosis present

## 2021-04-24 DIAGNOSIS — R188 Other ascites: Secondary | ICD-10-CM | POA: Diagnosis present

## 2021-04-24 DIAGNOSIS — Z794 Long term (current) use of insulin: Secondary | ICD-10-CM

## 2021-04-24 DIAGNOSIS — E119 Type 2 diabetes mellitus without complications: Secondary | ICD-10-CM

## 2021-04-24 DIAGNOSIS — E1122 Type 2 diabetes mellitus with diabetic chronic kidney disease: Secondary | ICD-10-CM | POA: Diagnosis present

## 2021-04-24 DIAGNOSIS — N1832 Chronic kidney disease, stage 3b: Secondary | ICD-10-CM | POA: Diagnosis present

## 2021-04-24 DIAGNOSIS — Z79899 Other long term (current) drug therapy: Secondary | ICD-10-CM

## 2021-04-24 DIAGNOSIS — K7460 Unspecified cirrhosis of liver: Secondary | ICD-10-CM | POA: Diagnosis present

## 2021-04-24 DIAGNOSIS — N189 Chronic kidney disease, unspecified: Principal | ICD-10-CM

## 2021-04-24 DIAGNOSIS — Z66 Do not resuscitate: Secondary | ICD-10-CM | POA: Diagnosis not present

## 2021-04-24 DIAGNOSIS — I10 Essential (primary) hypertension: Secondary | ICD-10-CM | POA: Diagnosis present

## 2021-04-24 DIAGNOSIS — N179 Acute kidney failure, unspecified: Secondary | ICD-10-CM | POA: Diagnosis present

## 2021-04-24 DIAGNOSIS — Z79890 Hormone replacement therapy (postmenopausal): Secondary | ICD-10-CM

## 2021-04-24 DIAGNOSIS — N1330 Unspecified hydronephrosis: Secondary | ICD-10-CM

## 2021-04-24 DIAGNOSIS — D72829 Elevated white blood cell count, unspecified: Secondary | ICD-10-CM | POA: Diagnosis present

## 2021-04-24 DIAGNOSIS — Z833 Family history of diabetes mellitus: Secondary | ICD-10-CM

## 2021-04-24 DIAGNOSIS — J45909 Unspecified asthma, uncomplicated: Secondary | ICD-10-CM | POA: Diagnosis present

## 2021-04-24 DIAGNOSIS — C787 Secondary malignant neoplasm of liver and intrahepatic bile duct: Secondary | ICD-10-CM | POA: Diagnosis present

## 2021-04-24 DIAGNOSIS — C786 Secondary malignant neoplasm of retroperitoneum and peritoneum: Secondary | ICD-10-CM

## 2021-04-24 DIAGNOSIS — Z87891 Personal history of nicotine dependence: Secondary | ICD-10-CM

## 2021-04-24 DIAGNOSIS — I452 Bifascicular block: Secondary | ICD-10-CM | POA: Diagnosis present

## 2021-04-24 DIAGNOSIS — Z880 Allergy status to penicillin: Secondary | ICD-10-CM

## 2021-04-24 DIAGNOSIS — Z9049 Acquired absence of other specified parts of digestive tract: Secondary | ICD-10-CM

## 2021-04-24 DIAGNOSIS — I129 Hypertensive chronic kidney disease with stage 1 through stage 4 chronic kidney disease, or unspecified chronic kidney disease: Secondary | ICD-10-CM | POA: Diagnosis present

## 2021-04-24 DIAGNOSIS — Z90710 Acquired absence of both cervix and uterus: Secondary | ICD-10-CM

## 2021-04-24 DIAGNOSIS — H409 Unspecified glaucoma: Secondary | ICD-10-CM | POA: Diagnosis present

## 2021-04-24 DIAGNOSIS — Z7984 Long term (current) use of oral hypoglycemic drugs: Secondary | ICD-10-CM

## 2021-04-24 DIAGNOSIS — D649 Anemia, unspecified: Secondary | ICD-10-CM | POA: Diagnosis present

## 2021-04-24 LAB — COMPREHENSIVE METABOLIC PANEL
ALT: 14 U/L (ref 0–44)
AST: 42 U/L — ABNORMAL HIGH (ref 15–41)
Albumin: 2.9 g/dL — ABNORMAL LOW (ref 3.5–5.0)
Alkaline Phosphatase: 344 U/L — ABNORMAL HIGH (ref 38–126)
Anion gap: 12 (ref 5–15)
BUN: 71 mg/dL — ABNORMAL HIGH (ref 8–23)
CO2: 21 mmol/L — ABNORMAL LOW (ref 22–32)
Calcium: 9.4 mg/dL (ref 8.9–10.3)
Chloride: 102 mmol/L (ref 98–111)
Creatinine, Ser: 3.04 mg/dL — ABNORMAL HIGH (ref 0.44–1.00)
GFR, Estimated: 16 mL/min — ABNORMAL LOW (ref >60)
Glucose, Bld: 149 mg/dL — ABNORMAL HIGH (ref 70–99)
Potassium: 6.2 mmol/L — ABNORMAL HIGH (ref 3.5–5.1)
Sodium: 135 mmol/L (ref 135–145)
Total Bilirubin: 1.1 mg/dL (ref 0.3–1.2)
Total Protein: 8.2 g/dL — ABNORMAL HIGH (ref 6.5–8.1)

## 2021-04-24 LAB — CBC WITH DIFFERENTIAL/PLATELET
Abs Immature Granulocytes: 0.12 10*3/uL — ABNORMAL HIGH (ref 0.00–0.07)
Basophils Absolute: 0 10*3/uL (ref 0.0–0.1)
Basophils Relative: 0 %
Eosinophils Absolute: 0 10*3/uL (ref 0.0–0.5)
Eosinophils Relative: 0 %
HCT: 32.4 % — ABNORMAL LOW (ref 36.0–46.0)
Hemoglobin: 10.5 g/dL — ABNORMAL LOW (ref 12.0–15.0)
Immature Granulocytes: 1 %
Lymphocytes Relative: 6 %
Lymphs Abs: 0.9 10*3/uL (ref 0.7–4.0)
MCH: 28.2 pg (ref 26.0–34.0)
MCHC: 32.4 g/dL (ref 30.0–36.0)
MCV: 86.9 fL (ref 80.0–100.0)
Monocytes Absolute: 1.1 10*3/uL — ABNORMAL HIGH (ref 0.1–1.0)
Monocytes Relative: 8 %
Neutro Abs: 11.5 10*3/uL — ABNORMAL HIGH (ref 1.7–7.7)
Neutrophils Relative %: 85 %
Platelets: 317 10*3/uL (ref 150–400)
RBC: 3.73 MIL/uL — ABNORMAL LOW (ref 3.87–5.11)
RDW: 16.8 % — ABNORMAL HIGH (ref 11.5–15.5)
WBC: 13.5 10*3/uL — ABNORMAL HIGH (ref 4.0–10.5)
nRBC: 0 % (ref 0.0–0.2)

## 2021-04-24 LAB — LACTIC ACID, PLASMA: Lactic Acid, Venous: 1.7 mmol/L (ref 0.5–1.9)

## 2021-04-24 LAB — LIPASE, BLOOD: Lipase: 49 U/L (ref 11–51)

## 2021-04-24 MED ORDER — SODIUM CHLORIDE 0.9 % IV BOLUS (SEPSIS)
1000.0000 mL | Freq: Once | INTRAVENOUS | Status: AC
  Administered 2021-04-25: 1000 mL via INTRAVENOUS

## 2021-04-24 MED ORDER — SODIUM ZIRCONIUM CYCLOSILICATE 10 G PO PACK
10.0000 g | PACK | Freq: Once | ORAL | Status: AC
  Administered 2021-04-25: 10 g via ORAL
  Filled 2021-04-24: qty 1

## 2021-04-24 NOTE — ED Triage Notes (Signed)
Pt BIBA from [facility] for abd pain x 1 month. Pt has left sided abd pain, 8/10. Pt was scheduled for biopsy yesterday, but it had to be rescheduled to next week. CBG 130. Vitals stable. Denies N/V/D.

## 2021-04-25 ENCOUNTER — Encounter: Payer: Self-pay | Admitting: Internal Medicine

## 2021-04-25 ENCOUNTER — Other Ambulatory Visit: Payer: Self-pay | Admitting: Radiology

## 2021-04-25 ENCOUNTER — Observation Stay: Payer: Medicare Other

## 2021-04-25 ENCOUNTER — Other Ambulatory Visit: Payer: Self-pay

## 2021-04-25 DIAGNOSIS — Q631 Lobulated, fused and horseshoe kidney: Secondary | ICD-10-CM | POA: Diagnosis not present

## 2021-04-25 DIAGNOSIS — D72829 Elevated white blood cell count, unspecified: Secondary | ICD-10-CM | POA: Diagnosis present

## 2021-04-25 DIAGNOSIS — N179 Acute kidney failure, unspecified: Secondary | ICD-10-CM

## 2021-04-25 DIAGNOSIS — Z880 Allergy status to penicillin: Secondary | ICD-10-CM | POA: Diagnosis not present

## 2021-04-25 DIAGNOSIS — I452 Bifascicular block: Secondary | ICD-10-CM | POA: Diagnosis present

## 2021-04-25 DIAGNOSIS — C541 Malignant neoplasm of endometrium: Secondary | ICD-10-CM | POA: Diagnosis present

## 2021-04-25 DIAGNOSIS — R188 Other ascites: Secondary | ICD-10-CM | POA: Diagnosis present

## 2021-04-25 DIAGNOSIS — I129 Hypertensive chronic kidney disease with stage 1 through stage 4 chronic kidney disease, or unspecified chronic kidney disease: Secondary | ICD-10-CM | POA: Diagnosis present

## 2021-04-25 DIAGNOSIS — R109 Unspecified abdominal pain: Secondary | ICD-10-CM | POA: Diagnosis present

## 2021-04-25 DIAGNOSIS — D649 Anemia, unspecified: Secondary | ICD-10-CM | POA: Diagnosis present

## 2021-04-25 DIAGNOSIS — E039 Hypothyroidism, unspecified: Secondary | ICD-10-CM | POA: Diagnosis present

## 2021-04-25 DIAGNOSIS — E875 Hyperkalemia: Secondary | ICD-10-CM | POA: Diagnosis present

## 2021-04-25 DIAGNOSIS — E860 Dehydration: Secondary | ICD-10-CM | POA: Diagnosis present

## 2021-04-25 DIAGNOSIS — Z9049 Acquired absence of other specified parts of digestive tract: Secondary | ICD-10-CM | POA: Diagnosis not present

## 2021-04-25 DIAGNOSIS — N1832 Chronic kidney disease, stage 3b: Secondary | ICD-10-CM | POA: Diagnosis present

## 2021-04-25 DIAGNOSIS — H409 Unspecified glaucoma: Secondary | ICD-10-CM | POA: Diagnosis present

## 2021-04-25 DIAGNOSIS — C786 Secondary malignant neoplasm of retroperitoneum and peritoneum: Secondary | ICD-10-CM | POA: Diagnosis present

## 2021-04-25 DIAGNOSIS — E785 Hyperlipidemia, unspecified: Secondary | ICD-10-CM | POA: Diagnosis present

## 2021-04-25 DIAGNOSIS — Z515 Encounter for palliative care: Secondary | ICD-10-CM | POA: Diagnosis not present

## 2021-04-25 DIAGNOSIS — E1122 Type 2 diabetes mellitus with diabetic chronic kidney disease: Secondary | ICD-10-CM | POA: Diagnosis present

## 2021-04-25 DIAGNOSIS — N131 Hydronephrosis with ureteral stricture, not elsewhere classified: Secondary | ICD-10-CM | POA: Diagnosis present

## 2021-04-25 DIAGNOSIS — N1330 Unspecified hydronephrosis: Secondary | ICD-10-CM

## 2021-04-25 DIAGNOSIS — C787 Secondary malignant neoplasm of liver and intrahepatic bile duct: Secondary | ICD-10-CM | POA: Diagnosis present

## 2021-04-25 DIAGNOSIS — K7460 Unspecified cirrhosis of liver: Secondary | ICD-10-CM | POA: Diagnosis present

## 2021-04-25 DIAGNOSIS — Z90710 Acquired absence of both cervix and uterus: Secondary | ICD-10-CM | POA: Diagnosis not present

## 2021-04-25 DIAGNOSIS — Z66 Do not resuscitate: Secondary | ICD-10-CM | POA: Diagnosis not present

## 2021-04-25 DIAGNOSIS — Z20822 Contact with and (suspected) exposure to covid-19: Secondary | ICD-10-CM | POA: Diagnosis present

## 2021-04-25 LAB — URINALYSIS, COMPLETE (UACMP) WITH MICROSCOPIC
Bilirubin Urine: NEGATIVE
Glucose, UA: NEGATIVE mg/dL
Ketones, ur: 5 mg/dL — AB
Leukocytes,Ua: NEGATIVE
Nitrite: NEGATIVE
Protein, ur: NEGATIVE mg/dL
Specific Gravity, Urine: 1.015 (ref 1.005–1.030)
Squamous Epithelial / HPF: NONE SEEN (ref 0–5)
pH: 5 (ref 5.0–8.0)

## 2021-04-25 LAB — RESP PANEL BY RT-PCR (FLU A&B, COVID) ARPGX2
Influenza A by PCR: NEGATIVE
Influenza B by PCR: NEGATIVE
SARS Coronavirus 2 by RT PCR: NEGATIVE

## 2021-04-25 LAB — CBC
HCT: 33.9 % — ABNORMAL LOW (ref 36.0–46.0)
Hemoglobin: 10.8 g/dL — ABNORMAL LOW (ref 12.0–15.0)
MCH: 27.9 pg (ref 26.0–34.0)
MCHC: 31.9 g/dL (ref 30.0–36.0)
MCV: 87.6 fL (ref 80.0–100.0)
Platelets: 321 10*3/uL (ref 150–400)
RBC: 3.87 MIL/uL (ref 3.87–5.11)
RDW: 16.9 % — ABNORMAL HIGH (ref 11.5–15.5)
WBC: 13.5 10*3/uL — ABNORMAL HIGH (ref 4.0–10.5)
nRBC: 0 % (ref 0.0–0.2)

## 2021-04-25 LAB — BASIC METABOLIC PANEL
Anion gap: 10 (ref 5–15)
BUN: 66 mg/dL — ABNORMAL HIGH (ref 8–23)
CO2: 20 mmol/L — ABNORMAL LOW (ref 22–32)
Calcium: 9.2 mg/dL (ref 8.9–10.3)
Chloride: 106 mmol/L (ref 98–111)
Creatinine, Ser: 3 mg/dL — ABNORMAL HIGH (ref 0.44–1.00)
GFR, Estimated: 16 mL/min — ABNORMAL LOW (ref >60)
Glucose, Bld: 141 mg/dL — ABNORMAL HIGH (ref 70–99)
Potassium: 5.9 mmol/L — ABNORMAL HIGH (ref 3.5–5.1)
Sodium: 136 mmol/L (ref 135–145)

## 2021-04-25 LAB — BODY FLUID CELL COUNT WITH DIFFERENTIAL
Eos, Fluid: 0 %
Lymphs, Fluid: 29 %
Monocyte-Macrophage-Serous Fluid: 28 %
Neutrophil Count, Fluid: 43 %
Total Nucleated Cell Count, Fluid: 1860 cu mm

## 2021-04-25 LAB — CBG MONITORING, ED
Glucose-Capillary: 135 mg/dL — ABNORMAL HIGH (ref 70–99)
Glucose-Capillary: 134 mg/dL — ABNORMAL HIGH (ref 70–99)

## 2021-04-25 LAB — GLUCOSE, CAPILLARY
Glucose-Capillary: 134 mg/dL — ABNORMAL HIGH (ref 70–99)
Glucose-Capillary: 106 mg/dL — ABNORMAL HIGH (ref 70–99)
Glucose-Capillary: 75 mg/dL (ref 70–99)

## 2021-04-25 LAB — MRSA PCR SCREENING: MRSA by PCR: NEGATIVE

## 2021-04-25 LAB — HIV ANTIBODY (ROUTINE TESTING W REFLEX): HIV Screen 4th Generation wRfx: NONREACTIVE

## 2021-04-25 LAB — HEMOGLOBIN A1C
Hgb A1c MFr Bld: 7.6 % — ABNORMAL HIGH (ref 4.8–5.6)
Mean Plasma Glucose: 171.42 mg/dL

## 2021-04-25 MED ORDER — MUPIROCIN 2 % EX OINT
1.0000 "application " | TOPICAL_OINTMENT | Freq: Two times a day (BID) | CUTANEOUS | Status: DC
  Filled 2021-04-25: qty 22

## 2021-04-25 MED ORDER — HEPARIN SODIUM (PORCINE) 5000 UNIT/ML IJ SOLN
5000.0000 [IU] | Freq: Three times a day (TID) | INTRAMUSCULAR | Status: DC
  Administered 2021-04-25 – 2021-04-30 (×13): 5000 [IU] via SUBCUTANEOUS
  Filled 2021-04-25 (×13): qty 1

## 2021-04-25 MED ORDER — DEXTROSE-NACL 5-0.9 % IV SOLN: INTRAVENOUS | Status: DC

## 2021-04-25 MED ORDER — ACETAMINOPHEN 325 MG PO TABS: 650.0000 mg | ORAL_TABLET | Freq: Four times a day (QID) | ORAL | Status: DC | PRN

## 2021-04-25 MED ORDER — INSULIN ASPART 100 UNIT/ML IJ SOLN
0.0000 [IU] | Freq: Every day | INTRAMUSCULAR | Status: DC
  Administered 2021-04-28: 3 [IU] via SUBCUTANEOUS
  Administered 2021-04-29: 4 [IU] via SUBCUTANEOUS
  Filled 2021-04-25 (×2): qty 1

## 2021-04-25 MED ORDER — MORPHINE SULFATE (PF) 2 MG/ML IV SOLN
2.0000 mg | INTRAVENOUS | Status: DC | PRN
Start: 2021-04-25 — End: 2021-04-25

## 2021-04-25 MED ORDER — ONDANSETRON HCL 4 MG PO TABS: 4.0000 mg | ORAL_TABLET | Freq: Four times a day (QID) | ORAL | Status: DC | PRN

## 2021-04-25 MED ORDER — INSULIN ASPART 100 UNIT/ML IJ SOLN
0.0000 [IU] | Freq: Three times a day (TID) | INTRAMUSCULAR | Status: DC
  Administered 2021-04-25 (×2): 2 [IU] via SUBCUTANEOUS
  Administered 2021-04-26 – 2021-04-28 (×7): 3 [IU] via SUBCUTANEOUS
  Administered 2021-04-28 – 2021-04-29 (×2): 5 [IU] via SUBCUTANEOUS
  Administered 2021-04-29: 18:00:00 15 [IU] via SUBCUTANEOUS
  Administered 2021-04-29: 8 [IU] via SUBCUTANEOUS
  Administered 2021-04-30 (×2): 15 [IU] via SUBCUTANEOUS
  Administered 2021-04-30: 8 [IU] via SUBCUTANEOUS
  Filled 2021-04-25 (×16): qty 1

## 2021-04-25 MED ORDER — HYDROCODONE-ACETAMINOPHEN 5-325 MG PO TABS
1.0000 | ORAL_TABLET | ORAL | Status: DC | PRN
  Administered 2021-04-27 – 2021-04-30 (×5): 1 via ORAL
  Filled 2021-04-25 (×6): qty 1

## 2021-04-25 MED ORDER — DEXAMETHASONE 4 MG PO TABS
4.0000 mg | ORAL_TABLET | Freq: Two times a day (BID) | ORAL | Status: DC
  Administered 2021-04-25: 17:00:00 4 mg via ORAL

## 2021-04-25 MED ORDER — SODIUM CHLORIDE 0.9 % IV SOLN: INTRAVENOUS | Status: DC

## 2021-04-25 MED ORDER — PNEUMOCOCCAL VAC POLYVALENT 25 MCG/0.5ML IJ INJ: 0.5000 mL | INJECTION | INTRAMUSCULAR | Status: DC

## 2021-04-25 MED ORDER — DEXTROSE 50 % IV SOLN
1.0000 | Freq: Once | INTRAVENOUS | Status: AC
  Administered 2021-04-25: 22:00:00 50 mL via INTRAVENOUS
  Filled 2021-04-25: qty 50

## 2021-04-25 MED ORDER — ACETAMINOPHEN 650 MG RE SUPP: 650.0000 mg | Freq: Four times a day (QID) | RECTAL | Status: DC | PRN

## 2021-04-25 MED ORDER — HYDROMORPHONE HCL 1 MG/ML IJ SOLN
0.5000 mg | INTRAMUSCULAR | Status: DC | PRN
  Administered 2021-04-25 – 2021-04-27 (×3): 1 mg via INTRAVENOUS
  Filled 2021-04-25 (×3): qty 1

## 2021-04-25 MED ORDER — ONDANSETRON HCL 4 MG/2ML IJ SOLN: 4.0000 mg | Freq: Four times a day (QID) | INTRAMUSCULAR | Status: DC | PRN

## 2021-04-25 MED ORDER — SODIUM ZIRCONIUM CYCLOSILICATE 10 G PO PACK
10.0000 g | PACK | Freq: Once | ORAL | Status: AC
  Administered 2021-04-25: 17:00:00 10 g via ORAL
  Filled 2021-04-25 (×2): qty 1

## 2021-04-25 NOTE — Procedures (Signed)
Interventional Radiology Procedure Note  Procedure: US guided paracentesis.   Complications: None Recommendations:  - routine wound care   Signed,  Luman Holway S. Tamkia Temples, DO    

## 2021-04-25 NOTE — H&P (Signed)
History and Physical    [name] MHD:[id number] DOB: 10-19-1951 DOA: 04/24/2021  PCP: Kirk Ruths, MD   Patient coming from: SNF  I have personally briefly reviewed patient's old medical records in [place]  Chief Complaint: Abdominal pain and vomiting  HPI: [name] is a 70 y.o. 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  Has decreased oral intake.  Patient is very weak and will only answer questions with yes or no answers.  As such history is limited. ED course: On arrival, afebrile, BP 117/60, pulse 96 respirations 27 with O2 sat 97% on room air.  Blood work significant for leukocytosis of 13,500, hemoglobin 10.5 at baseline, creatinine 3.04, up from baseline of 1.65 with potassium of 6.2. EKG, personally reviewed and interpreted: Sinus rhythm at 96 with nonspecific ST-T wave changes Imaging: CT abdomen pelvis with slight interval increase in abdominal pelvic ascites since prior exam 3 weeks prior with redemonstrated hepatic and widespread peritoneal metastatic disease as well as horseshoe kidney with hydronephrosis  Patient treated with Lokelma and IV fluid bolus.  Hospitalist consulted for admission.  Review of Systems: As per HPI otherwise all other systems on review of systems negative.    Past Medical History:  Diagnosis Date  . Adult failure to thrive   . Aortic atherosclerosis ([place])   . Asthma   . Carcinosarcoma of endometrium ([place])   . Cellulitis of left foot   . Cellulitis of right foot   . Diabetes mellitus without complication ([employer])   . Glaucoma   . HLD (hyperlipidemia)   . Hypertension   . Protein calorie malnutrition ([place])   .  Stage 2 skin ulcer of sacral region ([place])   . Thyroid disease    hypothyroid  . Urinary retention     Past Surgical History:  Procedure Laterality Date  . APPENDECTOMY    . LOWER EXTREMITY ANGIOGRAPHY Left 01/04/2019   Procedure: LOWER EXTREMITY ANGIOGRAPHY;  Surgeon: Algernon Huxley, MD;  Location: [facility];  Service: Cardiovascular;  Laterality: Left;     reports that she has quit smoking. She has never used smokeless tobacco. She reports that she does not drink alcohol and does not use drugs.  Allergies  Allergen Reactions  . Penicillins Swelling    Has patient had a PCN reaction causing immediate rash, facial/tongue/throat swelling, SOB or lightheadedness with hypotension: Unknown Has patient had a PCN reaction causing severe rash involving mucus membranes or skin necrosis: Unknown Has patient had a PCN reaction that required hospitalization: Unknown Has patient had a PCN reaction occurring within the last 10 years: Unknown If all of the above answers are "NO", then may proceed with Cephalosporin use.     Family History  Problem Relation Age of Onset  . Diabetes Mother   . Diabetes Father   . Alzheimer's disease Father   . Breast cancer Neg Hx       Prior to Admission medications   Medication Sig Start Date End Date Taking? Authorizing Provider  acetaminophen (TYLENOL) 325 MG tablet Take 650 mg by mouth every 4 (four) hours as needed for mild pain or moderate pain.    [provider]  alum & mag hydroxide-simeth (MAALOX/MYLANTA) 200-200-20 MG/5ML suspension  Take 30 mLs by mouth 4 (four) times daily as needed for indigestion or heartburn.    [provider]  atorvastatin (LIPITOR) 20 MG tablet Take 20 mg by mouth at bedtime.    [provider]  diphenhydrAMINE (BENADRYL) 25 MG tablet Take 25 mg by mouth every 6 (six) hours as needed for allergies.    [provider]  enoxaparin (LOVENOX) 40 MG/0.4ML injection Inject into the  skin. 07/15/20 08/12/20  [provider]  ferrous sulfate 325 (65 FE) MG tablet Take 325 mg by mouth daily with breakfast.    [provider]  fluticasone (FLONASE) 50 MCG/ACT nasal spray Place 2 sprays into both nostrils daily as needed for allergies.    [provider]  gabapentin (NEURONTIN) 300 MG capsule Take 300 mg by mouth 2 (two) times daily.    [provider]  glipiZIDE (GLUCOTROL) 10 MG tablet Take 10 mg by mouth daily. 12/04/20   [provider]  guaifenesin (ROBITUSSIN) 100 MG/5ML syrup Take 300 mg by mouth every 6 (six) hours as needed for cough.    [provider]  hydrOXYzine (VISTARIL) 25 MG capsule Take 25 mg by mouth every 8 (eight) hours as needed for anxiety.    [provider]  levothyroxine (SYNTHROID, LEVOTHROID) 125 MCG tablet Take 1 tablet (125 mcg total) by mouth daily before breakfast. 04/03/18   Bettey Costa, MD  loperamide (IMODIUM A-D) 2 MG tablet Take 2 mg by mouth as needed for diarrhea or loose stools. (max 8 doses in 24 hours)    [provider]  magnesium hydroxide (MILK OF MAGNESIA) 400 MG/5ML suspension Take 30 mLs by mouth daily as needed for mild constipation or moderate constipation.    [provider]  ondansetron (ZOFRAN ODT) 4 MG disintegrating tablet Take 1 tablet (4 mg total) by mouth every 8 (eight) hours as needed for nausea or vomiting. 04/06/21   Blake Divine, MD  pantoprazole (PROTONIX) 40 MG tablet Take 1 tablet by mouth daily. 04/11/21   [provider]  sennosides-docusate sodium (SENOKOT-S) 8.6-50 MG tablet Take 2 tablets by mouth at bedtime.    [provider]  Skin Protectants, Misc. (EUCERIN) cream Apply 1 application topically 2 (two) times daily. (apply to feet and legs)    [provider]  tamsulosin (FLOMAX) 0.4 MG CAPS capsule Take 1 capsule (0.4 mg total) by mouth daily. 04/03/18   Bettey Costa, MD  zinc gluconate 50 MG tablet Take 100  mg by mouth daily.    [provider]    Physical Exam: Vitals:   04/24/21 1847 04/24/21 1945 04/24/21 2200 04/25/21 0000  BP: (!) 122/53 132/69 115/63 117/60  Pulse: 95 94 94 96  Resp: 15 17 15  (!) 27  Temp: 98.1 F (36.7 C)     TempSrc: Oral     SpO2: 98% 98% 98% 97%  Weight:      Height:         Vitals:   04/24/21 1847 04/24/21 1945 04/24/21 2200 04/25/21 0000  BP: (!) 122/53 132/69 115/63 117/60  Pulse: 95 94 94 96  Resp: 15 17 15  (!) 27  Temp: 98.1 F (36.7 C)     TempSrc: Oral     SpO2: 98% 98% 98% 97%  Weight:      Height:          Constitutional:  Chronically ill and frail appearing, lethargic and oriented x 3 . Not in any apparent distress HEENT:  Head: Normocephalic and atraumatic.         Eyes: PERLA, EOMI, Conjunctivae are normal. Sclera is non-icteric.       Mouth/Throat: Mucous membranes are moist.       Neck: Supple with no signs of meningismus. Cardiovascular: Regular rate and rhythm. No murmurs, gallops, or rubs. 2+ symmetrical distal pulses are present . No JVD. No 2+ LE edema Respiratory:  Tachypneic with conversational dyspnea.Lungs sounds clear bilaterally. No wheezes, crackles, or rhonchi.  Gastrointestinal: Soft, distended, diffusely mildly tender with positive bowel sounds.  Genitourinary: No CVA tenderness. Musculoskeletal: Nontender with normal range of motion in all extremities. No cyanosis, or erythema of extremities. Neurologic:  Face is symmetric. Moving all extremities. No gross focal neurologic deficits . Skin: Skin is warm, dry.  No rash or ulcers Psychiatric: Mood and affect are normal    Labs on Admission: I have personally reviewed following labs and imaging studies  CBC: Recent Labs  Lab 04/24/21 1943  WBC 13.5*  NEUTROABS 11.5*  HGB 10.5*  HCT 32.4*  MCV 86.9  PLT 010   Basic Metabolic Panel: Recent Labs  Lab 04/24/21 1943  NA 135  K 6.2*  CL 102  CO2 21*  GLUCOSE 149*  BUN 71*  CREATININE 3.04*   CALCIUM 9.4   GFR: Estimated Creatinine Clearance: 18.6 mL/min (A) (by C-G formula based on SCr of 3.04 mg/dL (H)). Liver Function Tests: Recent Labs  Lab 04/24/21 1943  AST 42*  ALT 14  ALKPHOS 344*  BILITOT 1.1  PROT 8.2*  ALBUMIN 2.9*   Recent Labs  Lab 04/24/21 1943  LIPASE 49   No results for input(s): AMMONIA in the last 168 hours. Coagulation Profile: No results for input(s): INR, PROTIME in the last 168 hours. Cardiac Enzymes: No results for input(s): CKTOTAL, CKMB, CKMBINDEX, TROPONINI in the last 168 hours. BNP (last 3 results) No results for input(s): PROBNP in the last 8760 hours. HbA1C: No results for input(s): HGBA1C in the last 72 hours. CBG: No results for input(s): GLUCAP in the last 168 hours. Lipid Profile: No results for input(s): CHOL, HDL, LDLCALC, TRIG, CHOLHDL, LDLDIRECT in the last 72 hours. Thyroid Function Tests: No results for input(s): TSH, T4TOTAL, FREET4, T3FREE, THYROIDAB in the last 72 hours. Anemia Panel: No results for input(s): VITAMINB12, FOLATE, FERRITIN, TIBC, IRON, RETICCTPCT in the last 72 hours. Urine analysis:    Component Value Date/Time   COLORURINE YELLOW (A) 04/05/2021 2133   APPEARANCEUR CLOUDY (A) 04/05/2021 2133   LABSPEC 1.020 04/05/2021 2133   PHURINE 5.0 04/05/2021 2133   GLUCOSEU NEGATIVE 04/05/2021 2133   HGBUR NEGATIVE 04/05/2021 2133   BILIRUBINUR NEGATIVE 04/05/2021 2133   KETONESUR 5 (A) 04/05/2021 2133   PROTEINUR NEGATIVE 04/05/2021 2133   NITRITE NEGATIVE 04/05/2021 2133   LEUKOCYTESUR LARGE (A) 04/05/2021 2133    Radiological Exams on Admission: CT ABDOMEN PELVIS WO CONTRAST  Result Date: 04/24/2021 CLINICAL DATA:  Left-sided abdominal pain EXAM: CT ABDOMEN AND PELVIS WITHOUT CONTRAST TECHNIQUE: Multidetector CT imaging of the abdomen and pelvis was performed following the standard protocol without IV contrast. COMPARISON:  CT 04/05/2021 FINDINGS: Lower chest: Lung bases demonstrate no acute  consolidation or pleural effusion. Scattered pulmonary nodules measuring up to 5 mm in size are redemonstrated. Incompletely visualized hilar lymph nodes some of which are calcified. Hepatobiliary: Numerous low-attenuation liver masses consistent with metastatic disease. Slight increased gallbladder density which may be due to sludge or small stones. No biliary dilatation Pancreas: Unremarkable. No pancreatic ductal dilatation  or surrounding inflammatory changes. Spleen: Normal in size without focal abnormality. Adrenals/Urinary Tract: Adrenal glands are within normal limits. 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 Small stone in the dilated renal collecting system on the left. Urinary bladder is unremarkable. Stomach/Bowel: Stomach nonenlarged. No dilated small bowel. No acute bowel wall thickening. Vascular/Lymphatic: Moderate aortic atherosclerosis. No aneurysm. Right cardio phrenic adenopathy measuring up to 12 mm, previously 10 mm. Reproductive: Status post hysterectomy. Other: No free air. Increased ascites within the abdomen and pelvis. Widespread peritoneal metastatic disease with large soft tissue mesenteric mass in the lower abdomen and upper pelvis. Nodular infiltration of left abdominal mesentery consistent with metastatic disease, this appears increased. Numerous masses within the left greater than right colic gutters and within perihepatic ascites fluid. Small ventral hernia containing fat and soft tissue masslike density. Loculated ascites adjacent to the rectum. Musculoskeletal: No acute or suspicious osseous abnormality. IMPRESSION: 1. Slight interval increase in abdominopelvic ascites since the prior exam. 2. Redemonstrated hepatic metastatic disease and widespread peritoneal metastatic disease 3. 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 Electronically Signed   By: Donavan Foil M.D.   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  Patient has chronic hydronephrosis with UPJ obstruction that is chronic and unchanged - IV hydration - Received Lokelma in the emergency room - Monitor potassium and correct - Consider nephrology/urology consult if renal function not improving with hydration   Abdominal pain with vomiting Endometrial carcinoma with hepatic and peritoneal metastases - CT abdomen showing interval increase in ascites from 3 weeks prior - Pain and vomiting likely related to ascites, peritoneal mets - Leukocytosis of 13,000 with a lipase of 49, lactic acid 1.7 - Follow UA - Empiric antibiotics not initiated - Pain control, IV antiemetics, IV fluids - Nutritionist/TOC consult/palliative care    Type 2 diabetes mellitus with insulin therapy (HCC) - Sliding scale insulin coverage    Hypertension - Soft blood pressures we will hold antihypertensives for now    DVT prophylaxis: Heparin Code Status: full code  Family Communication:  none  Disposition Plan: Back to previous home environment Consults called: none  Status:At the time of admission, it appears that the appropriate admission status for this patient is INPATIENT. This is judged to be reasonable and necessary in order to provide the required intensity of service to ensure the patient's safety given the presenting symptoms, physical exam findings, and initial radiographic and laboratory data in the context of their  Comorbid  conditions.   Patient requires inpatient status due to high intensity of service, high risk for further deterioration and high frequency of surveillance required.   I certify that at the point of admission it is my clinical judgment that the patient will require inpatient hospital care spanning beyond [place] MD [facility]     04/25/2021, 1:40 AM

## 2021-04-25 NOTE — Progress Notes (Signed)
During admission the patient stated that Harriett Sine, patients cousin was her POA and her legal guardian. Patient stated that it was okay for me to talk with [facility] and let her know what was going on. I called [place] and she stated that she was not the patients POA or legal guardian. Again I did ask patient if she wanted me to share information with her and she said yes.

## 2021-04-25 NOTE — Progress Notes (Signed)
[name] with [facility] [employer] called to check on patient and get an update. I asked the patient if it was okay to talk to her and she said that was fine. Patient has not gave a password at this time, she said she will give one tomorrow.

## 2021-04-25 NOTE — Progress Notes (Signed)
Brief hospitalist update note.  This is a nonbillable note.  Please see same-day H&P from Dr. Damita Dunnings for full billable details.  Briefly, this is a 70 year old female with medical history significant for known endometrial carcinoma with widespread peritoneal and hepatic metastases associated with ascites as well as horseshoe kidney, chronic UPJ obstruction and chronic left hydronephrosis who presents with acutely worsening abdominal pain associated with intermittent nausea vomiting and poor p.o. intake.  Patient's pain was addressed with use of intravenous and oral narcotics with short-lived results.  Hyperkalemia is persistent despite Lokelma and aggressive IV fluids.  Will redose Lokelma 10 g x 1 continue IV fluids.  Recheck creatinine in the morning.  Nephrology consult requested.  Also discussed the case with oncology and palliative care.  Patient was seen by her known oncologist Dr. Grayland Ormond as well as Altha Harm from palliative care service.  She has a DNR status after her discussions with Josh.  Will attempt to optimize pain regimen with use of oral Decadron for anti-inflammatory effect and IV hydromorphone.  Also placed order for ultrasound-guided paracentesis.  After discussion with Dr. Grayland Ormond the patient was scheduled for outpatient liver biopsy tomorrow 5/5.  I have placed consultation for interventional radiology.  Hopefully we can get this procedure done while she is here with [place].  Ralene Muskrat MD

## 2021-04-25 NOTE — Consult Note (Signed)
[facility]  [phone] [phone]   Name: [name] Date: 04/25/2021 MRN: [id number]  DOB: 11-16-1951  Patient Care Team: Kirk Ruths, MD as PCP - General (Internal Medicine) Rico Junker, RN as Registered Nurse Theodore Demark, RN as Registered Nurse Nori Riis PA-C as Physician Assistant (Urology) Clent Jacks, RN as Oncology Nurse Navigator    REASON FOR CONSULTATION: [name] is a 70 y.o. 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  She was admitted to the hospital on 04/25/21 with abdominal pain and was found to have ascites and A/CKD. Palliative care was consulted to help address goals.   SOCIAL HISTORY:     reports that she has quit smoking. She has never used smokeless tobacco. She reports that she does not drink alcohol and does not use drugs.  Patient is not married. She has no children. She has a niece who is involved in her care.   ADVANCE DIRECTIVES:  None on file  CODE STATUS: DNR  PAST MEDICAL HISTORY: Past Medical History:  Diagnosis Date  . Adult failure to thrive   . Aortic atherosclerosis ([place])   . Asthma   . 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 Cellulitis of left foot   . Cellulitis of right foot   . Diabetes mellitus without complication ([place])   . Glaucoma   . HLD (hyperlipidemia)   . Hypertension   . Protein calorie malnutrition ([place])   . Stage 2 skin ulcer of sacral region ([place])   . Thyroid disease    hypothyroid  . Urinary retention     PAST SURGICAL HISTORY:  Past Surgical History:  Procedure Laterality Date  . APPENDECTOMY    . 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  MEDICATIONS:  Current Facility-Administered Medications  Medication Dose Route Frequency Provider Last Rate Last Admin  . 0.9 %  sodium chloride infusion   Intravenous Continuous Athena Masse, MD 125 mL/hr at 04/25/21 1337 New Bag at 04/25/21 1337  . acetaminophen (TYLENOL) tablet 650 mg  650 mg Oral Q6H PRN Athena Masse, MD       Or  . acetaminophen (TYLENOL) suppository 650 mg  650 mg Rectal Q6H PRN Athena Masse, MD      . heparin injection 5,000 Units  5,000 Units Subcutaneous Q8H Athena Masse, MD   5,000 Units at 04/25/21 1331  . HYDROcodone-acetaminophen (NORCO/VICODIN) 5-325 MG per tablet 1-2 tablet  1-2 tablet Oral Q4H PRN Yolanda Gaudier V, MD      . HYDROmorphone (DILAUDID) injection 0.5-1 mg  0.5-1 mg Intravenous Q3H PRN Sreenath, Sudheer B, MD      . insulin aspart (novoLOG) injection 0-15 Units  0-15 Units Subcutaneous TID WC Athena Masse, MD   2 Units at 04/25/21 1331  . insulin aspart (novoLOG) injection 0-5  Units  0-5 Units Subcutaneous QHS Athena Masse, MD      . mupirocin ointment (BACTROBAN) 2 % 1 application  1 application Nasal BID Ralene Muskrat B, MD      . ondansetron [facility]) tablet 4 mg  4 mg Oral Q6H PRN Athena Masse, MD       Or  . ondansetron [facility]) injection 4 mg  4 mg Intravenous Q6H PRN Athena Masse, MD      . Derrill Memo ON 04/26/2021] pneumococcal 23 valent vaccine (PNEUMOVAX-23) injection 0.5 mL  0.5 mL Intramuscular Tomorrow-1000 Sreenath, Sudheer B, MD      . sodium zirconium cyclosilicate (LOKELMA) packet 10 g  10 g Oral Once Ralene Muskrat B, MD         VITAL SIGNS: BP 132/65 (BP Location: Left Arm)   Pulse 97   Temp 98.3 F (36.8 C) (Oral)   Resp 18   Ht _0  (1.575 m)   Wt 207 lb (93.9 kg)   LMP  (LMP Unknown)   SpO2 96%   BMI 37.86 kg/m  Filed Weights   04/24/21 1846  Weight: 207 lb (93.9 kg)    Estimated body mass index is 37.86 kg/m as calculated from the following:   Height as of this encounter: _1  (1.575 m).   Weight as of this encounter: 207 lb (93.9 kg).  LABS: CBC:    Component Value Date/Time   WBC 13.5 (H) 04/25/2021 0330   HGB 10.8 (L) 04/25/2021 0330   HGB 12.7 03/06/2013 1122   HCT 33.9 (L) 04/25/2021 0330   HCT 39.1 03/06/2013 1122   PLT 321 04/25/2021 0330   PLT 267 03/06/2013 1122   MCV 87.6 04/25/2021 0330   MCV 86 03/06/2013 1122   NEUTROABS 11.5 (H) 04/24/2021 1943   NEUTROABS 4.5 03/06/2013 1122   LYMPHSABS 0.9 04/24/2021 1943   LYMPHSABS 1.6 03/06/2013 1122   MONOABS 1.1 (H) 04/24/2021 1943   MONOABS 0.6 03/06/2013 1122   EOSABS 0.0 04/24/2021 1943   EOSABS 0.2 03/06/2013 1122   BASOSABS 0.0 04/24/2021 1943   BASOSABS 0.0 03/06/2013 1122   Comprehensive Metabolic Panel:    Component Value Date/Time   NA 136 04/25/2021 0330   NA 140 08/10/2019 1357   NA 136 03/06/2013 1122   K 5.9 (H) 04/25/2021 0330   K 4.3 03/06/2013 1122   CL 106 04/25/2021 0330   CL 106 03/06/2013 1122   CO2 20 (L) 04/25/2021 0330   CO2 26 03/06/2013 1122   BUN 66 (H) 04/25/2021 0330   BUN 27 08/10/2019 1357   BUN 17 03/06/2013 1122   CREATININE 3.00 (H) 04/25/2021 0330   CREATININE 1.00 03/06/2013 1122   GLUCOSE 141 (H) 04/25/2021 0330   GLUCOSE 243 (H) 03/06/2013 1122   CALCIUM 9.2 04/25/2021 0330   CALCIUM 9.1 03/06/2013 1122   AST 42 (H) 04/24/2021 1943   AST 15 03/06/2013 1122   ALT 14 04/24/2021 1943   ALT 29 03/06/2013 1122   ALKPHOS 344 (H) 04/24/2021 1943   ALKPHOS 147 (H) 03/06/2013 1122   BILITOT 1.1 04/24/2021 1943   BILITOT 0.3 03/06/2013 1122   PROT 8.2 (H) 04/24/2021 1943    PROT 8.5 (H) 03/06/2013 1122   ALBUMIN 2.9 (L) 04/24/2021 1943   ALBUMIN 3.2 (L) 03/06/2013 1122    RADIOGRAPHIC STUDIES: CT ABDOMEN PELVIS WO CONTRAST  Result Date: 04/24/2021 CLINICAL DATA:  Left-sided abdominal pain EXAM: CT ABDOMEN AND PELVIS WITHOUT CONTRAST TECHNIQUE: Multidetector CT imaging  of the abdomen and pelvis was performed following the standard protocol without IV contrast. COMPARISON:  CT 04/05/2021 FINDINGS: Lower chest: Lung bases demonstrate no acute consolidation or pleural effusion. Scattered pulmonary nodules measuring up to 5 mm in size are redemonstrated. Incompletely visualized hilar lymph nodes some of which are calcified. Hepatobiliary: Numerous low-attenuation liver masses consistent with metastatic disease. Slight increased gallbladder density which may be due to sludge or small stones. No biliary dilatation Pancreas: Unremarkable. No pancreatic ductal dilatation or surrounding inflammatory changes. Spleen: Normal in size without focal abnormality. Adrenals/Urinary Tract: Adrenal glands are within normal limits. 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 Small stone in the dilated renal collecting system on the left. Urinary bladder is unremarkable. Stomach/Bowel: Stomach nonenlarged. No dilated small bowel. No acute bowel wall thickening. Vascular/Lymphatic: Moderate aortic atherosclerosis. No aneurysm. Right cardio phrenic adenopathy measuring up to 12 mm, previously 10 mm. Reproductive: Status post hysterectomy. Other: No free air. Increased ascites within the abdomen and pelvis. Widespread peritoneal metastatic disease with large soft tissue mesenteric mass in the lower abdomen and upper pelvis. Nodular infiltration of left abdominal mesentery consistent with metastatic disease, this appears increased. Numerous masses within the left greater than right colic gutters and within perihepatic ascites fluid. Small ventral hernia containing fat and soft tissue  masslike density. Loculated ascites adjacent to the rectum. Musculoskeletal: No acute or suspicious osseous abnormality. IMPRESSION: 1. Slight interval increase in abdominopelvic ascites since the prior exam. 2. Redemonstrated hepatic metastatic disease and widespread peritoneal metastatic disease 3. 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 Electronically Signed   By: Donavan Foil M.D.   On: 04/24/2021 23:56   CT CHEST WO CONTRAST  Result Date: 04/06/2021 CLINICAL DATA:  Cancer of unknown primary, multiple hepatic metastases EXAM: CT CHEST WITHOUT CONTRAST TECHNIQUE: Multidetector CT imaging of the chest was performed following the standard protocol without IV contrast. COMPARISON:  Same-day CT abdomen pelvis, CT chest 07/10/2020 FINDINGS: Cardiovascular: Limited assessment in the absence of contrast media. Normal heart size. No pericardial effusion. Coronary artery calcifications are present. Atherosclerotic plaque within the normal caliber aorta. Minimal plaque in the proximal great vessels which are otherwise unremarkable. No major venous abnormality is seen. Central pulmonary arteries are top-normal caliber. Mediastinum/Nodes: There are numerous partially calcified mediastinal and hilar lymph nodes including larger partially calcified paratracheal lymph node (2/55) measuring up to 18 mm short axis. A large partially calcified lymph node in the right hilum measures up to 14 mm short axis (2/56) additional hilar adenopathy is difficult fully assess in the absence of intravenous contrast media. No acute abnormality of the trachea or esophagus. Thyroid gland is unremarkable. Lungs/Pleura: There are 3 separate partially calcified nodule seen along the left major fissure measuring between 5-7 mm in size (3/52, 57, 61) which could reflect partially calcified intrapulmonary lymph nodes given this location. Additional Peri fissural nodule measuring 4 mm seen in the right lower  lobe (3/77) some bandlike regions of scarring are seen in the medial left upper lobe with some slight architectural distortion bronchiectatic change as well as a flattened, part calcified nodule measuring up to 6 mm (3/83). Few Peri vascular nodules are noted in the right lower lobe as well (3/83, 93) measuring up to 3 mm in size. No consolidative process. No pneumothorax or visible effusion. Upper Abdomen: Multiple hepatic lesions and cirrhotic configuration of the liver are similar to prior. Small amount of ascites. Redemonstrated hydronephrosis of a left renal moiety. Musculoskeletal:  The osseous structures appear diffusely demineralized which may limit detection of small or nondisplaced fractures. Multilevel degenerative changes are present in the imaged portions of the spine. No acute osseous abnormality or suspicious osseous lesion. No worrisome or suspicious lytic or blastic lesions are identified. No worrisome chest wall masses or lesions. Mild body wall edema. IMPRESSION: 1. Multiple partially calcified mediastinal and hilar lymph nodes, as well as multiple solid and partially calcified nodules throughout both lungs predominantly in a perilymphatic distribution. Given the interval stability from comparison CT in 2021, stable sequela of prior granulomatous disease is favored though certainly given the extent of this finding and the appearance of the abdomen, smaller metastatic adenopathy could be obscured particularly on this noncontrast CT. 2. Redemonstration of the cirrhotic configuration liver with numerous metastases and upper abdominal ascites. 3. Hydronephrosis of the left renal moiety. 4. Aortic Atherosclerosis (ICD10-I70.0). These results were called by telephone at the time of interpretation on 04/06/2021 at 12:39 am to provider [facility] , who verbally acknowledged these results. Electronically Signed   By: Lovena Le M.D.   On: 04/06/2021 00:42   CT Abdomen Pelvis W Contrast  Result  Date: 04/05/2021 CLINICAL DATA:  Abdominal pain. EXAM: CT ABDOMEN AND PELVIS WITH CONTRAST TECHNIQUE: Multidetector CT imaging of the abdomen and pelvis was performed using the standard protocol following bolus administration of intravenous contrast. CONTRAST:  87m OMNIPAQUE IOHEXOL 300 MG/ML  SOLN COMPARISON:  June 29, 2020 FINDINGS: Lower chest: Mild atelectasis is seen within the bilateral lung bases. Hepatobiliary: The liver is cirrhotic in appearance. Innumerable heterogeneous low-attenuation liver lesions are seen. This represents a new finding when compared to the prior exam. No gallstones, gallbladder wall thickening, or biliary dilatation. Pancreas: Unremarkable. No pancreatic ductal dilatation or surrounding inflammatory changes. Spleen: Normal in size without focal abnormality. Adrenals/Urinary Tract: Adrenal glands are unremarkable. 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 Stable marked severity left-sided hydronephrosis is noted. Bladder is unremarkable. Stomach/Bowel: There is a small hiatal hernia. The appendix is surgically absent. No evidence of bowel dilatation. Noninflamed diverticula are seen throughout the large bowel. Vascular/Lymphatic: Aortic atherosclerosis. No enlarged abdominal or pelvic lymph nodes. Reproductive: The uterus is not clearly identified. Other: A 2.3 cm x 1.6 cm umbilical hernia is seen. This contains fat and a small amount of soft tissue attenuation. An extensive amount of soft tissue attenuation is seen throughout the mesentery of the anterolateral aspect of the left lower quadrant and left hemipelvis. This represents a new finding when compared to the prior study. There is a mild amount of abdominal and pelvic free fluid. Musculoskeletal: Multilevel degenerative changes seen throughout the lumbar spine. IMPRESSION: 1. Cirrhotic liver with additional findings consistent with diffuse hepatic metastasis. 2. Extensive peritoneal metastasis within the left lower  quadrant and left hemipelvis. 3. 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 4. Small hiatal hernia. 5. Colonic diverticulosis. 6. Aortic atherosclerosis. Aortic Atherosclerosis (ICD10-I70.0). Electronically Signed   By: TVirgina NorfolkM.D.   On: 04/05/2021 22:29   [place]acentesis  Result Date: 04/25/2021 INDICATION: 70year old female with a history of abdominal pain and new ascites EXAM: ULTRASOUND GUIDED  PARACENTESIS MEDICATIONS: None. COMPLICATIONS: None PROCEDURE: Informed written consent was obtained from the patient after a discussion of the risks, benefits and alternatives to treatment. A timeout was performed prior to the initiation of the procedure. Initial ultrasound scanning demonstrates a small amount of ascites within the right lower abdominal quadrant. The right lower abdomen was prepped and draped in the  usual sterile fashion. 1% lidocaine was used for local anesthesia. Following this, a 8 Fr Safe-T-Centesis catheter was introduced. An ultrasound image was saved for documentation purposes. The paracentesis was performed. The catheter was removed and a dressing was applied. The patient tolerated the procedure well without immediate post procedural complication. FINDINGS: A total of approximately 1.75 L of serosanguineous fluid was removed. Samples were sent to the laboratory as requested by the clinical team. IMPRESSION: Status post ultrasound-guided paracentesis. Signed, Dulcy Fanny. Dellia Nims, RPVI [facility] Electronically Signed   By: Corrie Mckusick D.O.   On: 04/25/2021 12:28    PERFORMANCE STATUS (ECOG) : 2 - Symptomatic, <50% confined to bed  Review of Systems Unless otherwise noted, a complete review of systems is negative.  Physical Exam General: Frail-appearing Pulmonary: Unlabored Extremities: no edema, no joint deformities Skin: no rashes Neurological: Weakness but otherwise  nonfocal  IMPRESSION: I met with patient to discuss goals.  I introduced palliative care services and attempted to establish therapeutic rapport.  Patient verbalized an understanding that she has an advanced malignancy.  Plan was for liver biopsy to confirm recurrence of endometrial cancer and for possible reduced dose carbo/Taxol given her marginal performance status.  Patient says that she is unsure what to expect regarding the cancer and feels that the outcome is "so-so".  At baseline, patient says she lives at home alone.  She seems to lack significant social support.  She says that she has a niece who was involved.  With patient's permission, I tried calling her niece - Tedra Coupe, but was unable to reach her.   I discussed CODE STATUS with patient.  Patient verbalized clearly and repeatedly that she would not want to be resuscitated or have her life prolonged artificially on machines.  She stated if she passed "let me be in peace."  She says that she is familiar with the term "DO NOT RESUSCITATE" and would want to be a DNR/DNI.  Will change CODE STATUS to reflect patient's wishes.  Symptomatically, she is having upper abdominal pain.  Suspect capsular pain due to widespread liver involvement.  Could trial dexamethasone as an anti-inflammatory.  Agree with hydromorphone.  Would avoid morphine given risk of neurotoxicity with renal failure.  PLAN: -Continue current scope of treatment -Agree with hydromorphone as needed for pain -DNR/DNI -Will try to reach family  Case and plan discussed with Dr. Grayland Ormond   Time Total: 60 minutes  Visit consisted of counseling and education dealing with the complex and emotionally intense issues of symptom management and palliative care in the setting of serious and potentially life-threatening illness.Greater than 50%  of this time was spent counseling and coordinating care related to the above assessment and plan.  Signed by: Altha Harm, PhD,  NP-C

## 2021-04-25 NOTE — Consult Note (Signed)
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  This is likely metastatic disease, but patient has biopsy scheduled tomorrow to confirm.  She presented to the emergency room with increasing ascites and abdominal pain.  She otherwise feels well.  She has no neurologic complaints.  She denies any recent fevers.  She has a poor appetite, but denies weight loss.  She has no chest pain, shortness of breath, cough, or hemoptysis.  She has increased nausea and vomiting, but denies constipation or diarrhea.  She has no melena or hematochezia.  She has no urinary complaints.  Patient otherwise feels well and offers no further specific complaints today.  REVIEW OF SYSTEMS:   Review of Systems  Constitutional: Negative.  Negative for fever, malaise/fatigue and weight loss.  Respiratory: Negative.  Negative for cough, hemoptysis and shortness of breath.   Cardiovascular: Negative.  Negative for chest pain and leg swelling.  Gastrointestinal: Positive for abdominal pain, nausea and vomiting. Negative for constipation and diarrhea.  Genitourinary: Negative.  Negative for dysuria.  Musculoskeletal: Negative.  Negative for back pain.  Skin: Negative.  Negative for rash.  Neurological: Negative.  Negative for dizziness, focal weakness, weakness  and headaches.  Psychiatric/Behavioral: Negative.  The patient is not nervous/anxious.     As per HPI. Otherwise, a complete review of systems is negative.  PAST MEDICAL HISTORY: Past Medical History:  Diagnosis Date  . Adult failure to thrive   . Aortic atherosclerosis ([place])   . Asthma   . Carcinosarcoma of endometrium ([place])   . Cellulitis of left foot   . Cellulitis of right foot   . Diabetes mellitus without complication ([place])   . Glaucoma   . HLD (hyperlipidemia)   . Hypertension   . Protein calorie malnutrition ([place])   . Stage 2 skin ulcer of sacral region ([place])   . Thyroid disease    hypothyroid  . Urinary retention     PAST SURGICAL HISTORY: Past Surgical History:  Procedure Laterality Date  . APPENDECTOMY    . LOWER EXTREMITY ANGIOGRAPHY Left 01/04/2019   Procedure: LOWER EXTREMITY ANGIOGRAPHY;  Surgeon: Algernon Huxley, MD;  Location: [facility];  Service: Cardiovascular;  Laterality: Left;    FAMILY HISTORY: Family History  Problem Relation Age of Onset  . Diabetes Mother   . Diabetes Father   . Alzheimer's disease Father   . Breast cancer Neg Hx     ADVANCED DIRECTIVES (Y/N):  @ADVDIR @  HEALTH MAINTENANCE: Social History   Tobacco Use  . Smoking status: Former Research scientist (life sciences)  . Smokeless tobacco: Never Used  Vaping Use  . Vaping Use: Never used  Substance Use Topics  . Alcohol use: No  . Drug use: No     Colonoscopy:  PAP:  Bone density:  Lipid panel:  Allergies  Allergen Reactions  . Penicillins Swelling    Has patient had a PCN reaction causing immediate rash, facial/tongue/throat  swelling, SOB or lightheadedness with hypotension: Unknown Has patient had a PCN reaction causing severe rash involving mucus membranes or skin necrosis: Unknown Has patient had a PCN reaction that required hospitalization: Unknown Has patient had a PCN reaction occurring within the last 10 years: Unknown If all of the above answers are "NO", then may proceed  with Cephalosporin use.     Current Facility-Administered Medications  Medication Dose Route Frequency Provider Last Rate Last Admin  . 0.9 %  sodium chloride infusion   Intravenous Continuous Athena Masse, MD 125 mL/hr at 04/25/21 0332 New Bag at 04/25/21 0332  . acetaminophen (TYLENOL) tablet 650 mg  650 mg Oral Q6H PRN Athena Masse, MD       Or  . acetaminophen (TYLENOL) suppository 650 mg  650 mg Rectal Q6H PRN Athena Masse, MD      . heparin injection 5,000 Units  5,000 Units Subcutaneous Q8H Athena Masse, MD      . HYDROcodone-acetaminophen (NORCO/VICODIN) 5-325 MG per tablet 1-2 tablet  1-2 tablet Oral Q4H PRN Athena Masse, MD      . HYDROmorphone (DILAUDID) injection 0.5-1 mg  0.5-1 mg Intravenous Q3H PRN Sreenath, Sudheer B, MD      . insulin aspart (novoLOG) injection 0-15 Units  0-15 Units Subcutaneous TID WC Athena Masse, MD   2 Units at 04/25/21 [phone]  . insulin aspart (novoLOG) injection 0-5 Units  0-5 Units Subcutaneous QHS Judd Gaudier V, MD      . ondansetron [facility]) tablet 4 mg  4 mg Oral Q6H PRN Athena Masse, MD       Or  . ondansetron [facility]) injection 4 mg  4 mg Intravenous Q6H PRN Athena Masse, MD      . sodium zirconium cyclosilicate (LOKELMA) packet 10 g  10 g Oral Once Sidney Ace, MD       Current Outpatient Medications  Medication Sig Dispense Refill  . acetaminophen (TYLENOL) 325 MG tablet Take 650 mg by mouth every 4 (four) hours as needed for mild pain or moderate pain.    Marland Kitchen alum & mag hydroxide-simeth (MAALOX/MYLANTA) 200-200-20 MG/5ML suspension Take 30 mLs by mouth 4 (four) times daily as needed for indigestion or heartburn.    Marland Kitchen atorvastatin (LIPITOR) 20 MG tablet Take 20 mg by mouth at bedtime.    . diphenhydrAMINE (BENADRYL) 25 MG tablet Take 25 mg by mouth every 6 (six) hours as needed for allergies.    . ferrous sulfate 325 (65 FE) MG tablet Take 325 mg by mouth daily with breakfast.    . fluticasone (FLONASE) 50  MCG/ACT nasal spray Place 2 sprays into both nostrils daily as needed for allergies.    Marland Kitchen gabapentin (NEURONTIN) 300 MG capsule Take 300 mg by mouth 2 (two) times daily.    Marland Kitchen glipiZIDE (GLUCOTROL) 10 MG tablet Take 10 mg by mouth daily.    Marland Kitchen guaifenesin (ROBITUSSIN) 100 MG/5ML syrup Take 300 mg by mouth every 6 (six) hours as needed for cough.    Marland Kitchen HYDROcodone-acetaminophen (NORCO/VICODIN) 5-325 MG tablet Take 1 tablet by mouth every 6 (six) hours as needed for moderate pain.    . hydrOXYzine (VISTARIL) 25 MG capsule Take 25 mg by mouth every 8 (eight) hours as needed for anxiety.    Marland Kitchen levothyroxine (SYNTHROID, LEVOTHROID) 125 MCG tablet Take 1 tablet (125 mcg total) by mouth daily before breakfast. 30 tablet 0  . loperamide (IMODIUM A-D) 2 MG tablet Take 2  mg by mouth as needed for diarrhea or loose stools. (max 8 doses in 24 hours)    . magnesium hydroxide (MILK OF MAGNESIA) 400 MG/5ML suspension Take 30 mLs by mouth daily as needed for mild constipation or moderate constipation.    . ondansetron (ZOFRAN) 4 MG tablet Take 4 mg by mouth every 6 (six) hours as needed for nausea or vomiting.    . pantoprazole (PROTONIX) 40 MG tablet Take 40 mg by mouth daily.    . sennosides-docusate sodium (SENOKOT-S) 8.6-50 MG tablet Take 2 tablets by mouth at bedtime.    . Skin Protectants, Misc. (EUCERIN) cream Apply 1 application topically 2 (two) times daily. (apply to feet and legs)    . tamsulosin (FLOMAX) 0.4 MG CAPS capsule Take 1 capsule (0.4 mg total) by mouth daily. 30 capsule 0  . Zinc Sulfate 220 (50 Zn) MG TABS Take 2 tablets by mouth daily.    . ondansetron (ZOFRAN ODT) 4 MG disintegrating tablet Take 1 tablet (4 mg total) by mouth every 8 (eight) hours as needed for nausea or vomiting. (Patient not taking: No sig reported) 12 tablet 0    OBJECTIVE: Vitals:   04/25/21 0401 04/25/21 0828  BP: 134/73 121/72  Pulse: 100 99  Resp: 20 14  Temp:  98.3 F (36.8 C)  SpO2: 97% 98%     Body mass  index is 37.86 kg/m.    ECOG FS:2 - Symptomatic, <50% confined to bed  General: Well-developed, well-nourished, no acute distress. Eyes: Pink conjunctiva, anicteric sclera. HEENT: Normocephalic, moist mucous membranes. Lungs: No audible wheezing or coughing. Heart: Regular rate and rhythm. Abdomen: Distended, mild tenderness to palpation. Musculoskeletal: No edema, cyanosis, or clubbing. Neuro: Alert, answering all questions appropriately. Cranial nerves grossly intact. Skin: No rashes or petechiae noted. Psych: Normal affect.   LAB RESULTS:  Lab Results  Component Value Date   NA 136 04/25/2021   K 5.9 (H) 04/25/2021   CL 106 04/25/2021   CO2 20 (L) 04/25/2021   GLUCOSE 141 (H) 04/25/2021   BUN 66 (H) 04/25/2021   CREATININE 3.00 (H) 04/25/2021   CALCIUM 9.2 04/25/2021   PROT 8.2 (H) 04/24/2021   ALBUMIN 2.9 (L) 04/24/2021   AST 42 (H) 04/24/2021   ALT 14 04/24/2021   ALKPHOS 344 (H) 04/24/2021   BILITOT 1.1 04/24/2021   GFRNONAA 16 (L) 04/25/2021   GFRAA 54 (L) 07/10/2020    Lab Results  Component Value Date   WBC 13.5 (H) 04/25/2021   NEUTROABS 11.5 (H) 04/24/2021   HGB 10.8 (L) 04/25/2021   HCT 33.9 (L) 04/25/2021   MCV 87.6 04/25/2021   PLT 321 04/25/2021     STUDIES: CT ABDOMEN PELVIS WO CONTRAST  Result Date: 04/24/2021 CLINICAL DATA:  Left-sided abdominal pain EXAM: CT ABDOMEN AND PELVIS WITHOUT CONTRAST TECHNIQUE: Multidetector CT imaging of the abdomen and pelvis was performed following the standard protocol without IV contrast. COMPARISON:  CT 04/05/2021 FINDINGS: Lower chest: Lung bases demonstrate no acute consolidation or pleural effusion. Scattered pulmonary nodules measuring up to 5 mm in size are redemonstrated. Incompletely visualized hilar lymph nodes some of which are calcified. Hepatobiliary: Numerous low-attenuation liver masses consistent with metastatic disease. Slight increased gallbladder density which may be due to sludge or small stones.  No biliary dilatation Pancreas: Unremarkable. No pancreatic ductal dilatation or surrounding inflammatory changes. Spleen: Normal in size without focal abnormality. Adrenals/Urinary Tract: Adrenal glands are within normal limits. 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 Small stone  in the dilated renal collecting system on the left. Urinary bladder is unremarkable. Stomach/Bowel: Stomach nonenlarged. No dilated small bowel. No acute bowel wall thickening. Vascular/Lymphatic: Moderate aortic atherosclerosis. No aneurysm. Right cardio phrenic adenopathy measuring up to 12 mm, previously 10 mm. Reproductive: Status post hysterectomy. Other: No free air. Increased ascites within the abdomen and pelvis. Widespread peritoneal metastatic disease with large soft tissue mesenteric mass in the lower abdomen and upper pelvis. Nodular infiltration of left abdominal mesentery consistent with metastatic disease, this appears increased. Numerous masses within the left greater than right colic gutters and within perihepatic ascites fluid. Small ventral hernia containing fat and soft tissue masslike density. Loculated ascites adjacent to the rectum. Musculoskeletal: No acute or suspicious osseous abnormality. IMPRESSION: 1. Slight interval increase in abdominopelvic ascites since the prior exam. 2. Redemonstrated hepatic metastatic disease and widespread peritoneal metastatic disease 3. 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 Electronically Signed   By: Donavan Foil M.D.   On: 04/24/2021 23:56   CT CHEST WO CONTRAST  Result Date: 04/06/2021 CLINICAL DATA:  Cancer of unknown primary, multiple hepatic metastases EXAM: CT CHEST WITHOUT CONTRAST TECHNIQUE: Multidetector CT imaging of the chest was performed following the standard protocol without IV contrast. COMPARISON:  Same-day CT abdomen pelvis, CT chest 07/10/2020 FINDINGS: Cardiovascular: Limited assessment in the  absence of contrast media. Normal heart size. No pericardial effusion. Coronary artery calcifications are present. Atherosclerotic plaque within the normal caliber aorta. Minimal plaque in the proximal great vessels which are otherwise unremarkable. No major venous abnormality is seen. Central pulmonary arteries are top-normal caliber. Mediastinum/Nodes: There are numerous partially calcified mediastinal and hilar lymph nodes including larger partially calcified paratracheal lymph node (2/55) measuring up to 18 mm short axis. A large partially calcified lymph node in the right hilum measures up to 14 mm short axis (2/56) additional hilar adenopathy is difficult fully assess in the absence of intravenous contrast media. No acute abnormality of the trachea or esophagus. Thyroid gland is unremarkable. Lungs/Pleura: There are 3 separate partially calcified nodule seen along the left major fissure measuring between 5-7 mm in size (3/52, 57, 61) which could reflect partially calcified intrapulmonary lymph nodes given this location. Additional Peri fissural nodule measuring 4 mm seen in the right lower lobe (3/77) some bandlike regions of scarring are seen in the medial left upper lobe with some slight architectural distortion bronchiectatic change as well as a flattened, part calcified nodule measuring up to 6 mm (3/83). Few Peri vascular nodules are noted in the right lower lobe as well (3/83, 93) measuring up to 3 mm in size. No consolidative process. No pneumothorax or visible effusion. Upper Abdomen: Multiple hepatic lesions and cirrhotic configuration of the liver are similar to prior. Small amount of ascites. Redemonstrated hydronephrosis of a left renal moiety. Musculoskeletal: The osseous structures appear diffusely demineralized which may limit detection of small or nondisplaced fractures. Multilevel degenerative changes are present in the imaged portions of the spine. No acute osseous abnormality or suspicious  osseous lesion. No worrisome or suspicious lytic or blastic lesions are identified. No worrisome chest wall masses or lesions. Mild body wall edema. IMPRESSION: 1. Multiple partially calcified mediastinal and hilar lymph nodes, as well as multiple solid and partially calcified nodules throughout both lungs predominantly in a perilymphatic distribution. Given the interval stability from comparison CT in 2021, stable sequela of prior granulomatous disease is favored though certainly given the extent of this finding and the appearance of the  abdomen, smaller metastatic adenopathy could be obscured particularly on this noncontrast CT. 2. Redemonstration of the cirrhotic configuration liver with numerous metastases and upper abdominal ascites. 3. Hydronephrosis of the left renal moiety. 4. Aortic Atherosclerosis (ICD10-I70.0). These results were called by telephone at the time of interpretation on 04/06/2021 at 12:39 am to provider [facility] , who verbally acknowledged these results. Electronically Signed   By: Lovena Le M.D.   On: 04/06/2021 00:42   CT Abdomen Pelvis W Contrast  Result Date: 04/05/2021 CLINICAL DATA:  Abdominal pain. EXAM: CT ABDOMEN AND PELVIS WITH CONTRAST TECHNIQUE: Multidetector CT imaging of the abdomen and pelvis was performed using the standard protocol following bolus administration of intravenous contrast. CONTRAST:  68mL OMNIPAQUE IOHEXOL 300 MG/ML  SOLN COMPARISON:  June 29, 2020 FINDINGS: Lower chest: Mild atelectasis is seen within the bilateral lung bases. Hepatobiliary: The liver is cirrhotic in appearance. Innumerable heterogeneous low-attenuation liver lesions are seen. This represents a new finding when compared to the prior exam. No gallstones, gallbladder wall thickening, or biliary dilatation. Pancreas: Unremarkable. No pancreatic ductal dilatation or surrounding inflammatory changes. Spleen: Normal in size without focal abnormality. Adrenals/Urinary Tract: Adrenal glands  are unremarkable. 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 Stable marked severity left-sided hydronephrosis is noted. Bladder is unremarkable. Stomach/Bowel: There is a small hiatal hernia. The appendix is surgically absent. No evidence of bowel dilatation. Noninflamed diverticula are seen throughout the large bowel. Vascular/Lymphatic: Aortic atherosclerosis. No enlarged abdominal or pelvic lymph nodes. Reproductive: The uterus is not clearly identified. Other: A 2.3 cm x 1.6 cm umbilical hernia is seen. This contains fat and a small amount of soft tissue attenuation. An extensive amount of soft tissue attenuation is seen throughout the mesentery of the anterolateral aspect of the left lower quadrant and left hemipelvis. This represents a new finding when compared to the prior study. There is a mild amount of abdominal and pelvic free fluid. Musculoskeletal: Multilevel degenerative changes seen throughout the lumbar spine. IMPRESSION: 1. Cirrhotic liver with additional findings consistent with diffuse hepatic metastasis. 2. Extensive peritoneal metastasis within the left lower quadrant and left hemipelvis. 3. 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 4. Small hiatal hernia. 5. Colonic diverticulosis. 6. Aortic atherosclerosis. Aortic Atherosclerosis (ICD10-I70.0). Electronically Signed   By: Virgina Norfolk M.D.   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   PLAN:    1.  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  Patient previously underwent hysterectomy for what was thought to be noninvasive local disease.  Previously, biopsy was scheduled to occur as an outpatient tomorrow.  This has been ordered to complete while patient is admitted.  Agree with palliative care consult. 2.  Ascites/abdominal pain: Likely secondary to liver  metastasis.  Agree with paracentesis and please send for cytology.  Agree with current narcotic regimen of Dilaudid and Norco as needed.  Palliative care consult as above. 3.  Acute renal failure: Likely multifactorial with nausea vomiting and dehydration along with increased ascites and liver disease.  Monitor. 4.  Leukocytosis: Likely reactive, monitor. 5.  Anemia: Mild, monitor. 6.  Hyperkalemia: Likely related to underlying acute renal failure.  Consider nephrology consult if does not resolve with IV fluids.  Appreciate consult, will follow.    Lloyd Huger, MD   04/25/2021 9:32 AM

## 2021-04-25 NOTE — Progress Notes (Signed)
Patient was originally scheduled for liver biopsy with IR as an outpatient tomorrow, however, patient is now admitted.   The liver biopsy to be done as an inpatient tomorrow per Dr. Earleen Newport.   Made npo at midnight. CBC with diff and INR ordered.  Held sq heparin.   Formal consult to follow.    Tera Mater PA-C 04/25/2021 4:33 PM

## 2021-04-25 NOTE — Progress Notes (Signed)
Patient unable to sit on side of bed, she states her abdomen hurts tremendously.

## 2021-04-25 NOTE — Consult Note (Signed)
[facility]  CONSULT NOTE    Date: 04/25/2021                  Patient Name:  [name]  MRN: [id number]  DOB: [birth year]  Age / Sex: 70 y.o., female         PCP: Kirk Ruths, MD                 Service Requesting Consult: [facility]                 Reason for Consult: Acute kidney injury            History of Present Illness: Ms. [name] is a 70 y.o.  female with , who was admitted to [facility] on 04/24/2021 for acute kidney injury with hyperkalemia. 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   Patient states she has had poor appetite with nausea and intermittent vomiting for a few months now. She states the pain in her abdomen has worsened. She has increasingly become weaker. Denies shortness of breath. Denies use of NSAIDs or new medications.   Medications: Outpatient medications: (Not in a hospital admission)   Current medications: Current Facility-Administered Medications  Medication Dose Route Frequency Provider Last Rate Last Admin  . 0.9 %  sodium chloride infusion   Intravenous Continuous Athena Masse, MD 125 mL/hr at 04/25/21 0332 New Bag at 04/25/21 0332  . acetaminophen (TYLENOL) tablet 650 mg  650 mg Oral Q6H PRN Athena Masse, MD       Or  . acetaminophen (TYLENOL) suppository 650 mg  650 mg Rectal Q6H PRN Athena Masse, MD      . heparin injection 5,000 Units  5,000 Units Subcutaneous Q8H Athena Masse, MD      . HYDROcodone-acetaminophen (NORCO/VICODIN) 5-325 MG per tablet 1-2 tablet  1-2 tablet Oral Q4H PRN Athena Masse, MD      . HYDROmorphone (DILAUDID) injection 0.5-1 mg  0.5-1 mg Intravenous Q3H PRN Sreenath, Sudheer B, MD      . insulin aspart (novoLOG) injection 0-15 Units  0-15 Units Subcutaneous TID WC Athena Masse, MD   2 Units at 04/25/21  [phone]  . insulin aspart (novoLOG) injection 0-5 Units  0-5 Units Subcutaneous QHS Judd Gaudier V, MD      . ondansetron [facility]) tablet 4 mg  4 mg Oral Q6H PRN Athena Masse, MD       Or  . ondansetron [facility]) injection 4 mg  4 mg Intravenous Q6H PRN Athena Masse, MD      . sodium zirconium cyclosilicate (LOKELMA) packet 10 g  10 g Oral Once Sidney Ace, MD       Current Outpatient Medications  Medication Sig Dispense Refill  . acetaminophen (TYLENOL) 325 MG tablet Take 650 mg by mouth every 4 (four) hours as needed for mild pain or moderate pain.    Marland Kitchen alum & mag hydroxide-simeth (MAALOX/MYLANTA) 200-200-20 MG/5ML suspension Take 30 mLs by mouth 4 (four) times daily as needed for indigestion or heartburn.    Marland Kitchen atorvastatin (LIPITOR) 20 MG tablet Take 20 mg by mouth at bedtime.    . diphenhydrAMINE (BENADRYL) 25 MG tablet Take 25 mg by mouth every 6 (six) hours as needed for allergies.    . ferrous sulfate 325 (65 FE) MG  tablet Take 325 mg by mouth daily with breakfast.    . fluticasone (FLONASE) 50 MCG/ACT nasal spray Place 2 sprays into both nostrils daily as needed for allergies.    Marland Kitchen gabapentin (NEURONTIN) 300 MG capsule Take 300 mg by mouth 2 (two) times daily.    Marland Kitchen glipiZIDE (GLUCOTROL) 10 MG tablet Take 10 mg by mouth daily.    Marland Kitchen guaifenesin (ROBITUSSIN) 100 MG/5ML syrup Take 300 mg by mouth every 6 (six) hours as needed for cough.    Marland Kitchen HYDROcodone-acetaminophen (NORCO/VICODIN) 5-325 MG tablet Take 1 tablet by mouth every 6 (six) hours as needed for moderate pain.    . hydrOXYzine (VISTARIL) 25 MG capsule Take 25 mg by mouth every 8 (eight) hours as needed for anxiety.    Marland Kitchen levothyroxine (SYNTHROID, LEVOTHROID) 125 MCG tablet Take 1 tablet (125 mcg total) by mouth daily before breakfast. 30 tablet 0  . loperamide (IMODIUM A-D) 2 MG tablet Take 2 mg by mouth as needed for diarrhea or loose stools. (max 8 doses in 24 hours)    . magnesium hydroxide (MILK OF MAGNESIA) 400  MG/5ML suspension Take 30 mLs by mouth daily as needed for mild constipation or moderate constipation.    . ondansetron (ZOFRAN) 4 MG tablet Take 4 mg by mouth every 6 (six) hours as needed for nausea or vomiting.    . pantoprazole (PROTONIX) 40 MG tablet Take 40 mg by mouth daily.    . sennosides-docusate sodium (SENOKOT-S) 8.6-50 MG tablet Take 2 tablets by mouth at bedtime.    . Skin Protectants, Misc. (EUCERIN) cream Apply 1 application topically 2 (two) times daily. (apply to feet and legs)    . tamsulosin (FLOMAX) 0.4 MG CAPS capsule Take 1 capsule (0.4 mg total) by mouth daily. 30 capsule 0  . Zinc Sulfate 220 (50 Zn) MG TABS Take 2 tablets by mouth daily.    . ondansetron (ZOFRAN ODT) 4 MG disintegrating tablet Take 1 tablet (4 mg total) by mouth every 8 (eight) hours as needed for nausea or vomiting. (Patient not taking: No sig reported) 12 tablet 0      Allergies: Allergies  Allergen Reactions  . Penicillins Swelling    Has patient had a PCN reaction causing immediate rash, facial/tongue/throat swelling, SOB or lightheadedness with hypotension: Unknown Has patient had a PCN reaction causing severe rash involving mucus membranes or skin necrosis: Unknown Has patient had a PCN reaction that required hospitalization: Unknown Has patient had a PCN reaction occurring within the last 10 years: Unknown If all of the above answers are "NO", then may proceed with Cephalosporin use.       Past Medical History: Past Medical History:  Diagnosis Date  . Adult failure to thrive   . Aortic atherosclerosis ([place])   . Asthma   . Carcinosarcoma of endometrium ([place])   . Cellulitis of left foot   . Cellulitis of right foot   . Diabetes mellitus without complication ([place])   . Glaucoma   . HLD (hyperlipidemia)   . Hypertension   . Protein calorie malnutrition ([place])   . Stage 2 skin ulcer of sacral region ([place])   . Thyroid disease    hypothyroid  . Urinary retention      Past Surgical  History: Past Surgical History:  Procedure Laterality Date  . APPENDECTOMY    . LOWER EXTREMITY ANGIOGRAPHY Left 01/04/2019   Procedure: LOWER EXTREMITY ANGIOGRAPHY;  Surgeon: Algernon Huxley, MD;  Location: [facility];  Service: Cardiovascular;  Laterality: Left;     Family History: Family History  Problem Relation Age of Onset  . Diabetes Mother   . Diabetes Father   . Alzheimer's disease Father   . Breast cancer Neg Hx      Social History: Social History   Socioeconomic History  . Marital status: Single    Spouse name: Not on file  . Number of children: Not on file  . Years of education: Not on file  . Highest education level: Not on file  Occupational History  . 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 Smoking status: Former Research scientist (life sciences)  . Smokeless tobacco: Never Used  Vaping Use  . Vaping Use: Never used  Substance and Sexual Activity  . Alcohol use: No  . Drug use: No  . Sexual activity: Not on file  Other Topics Concern  . Not on file  Social History Narrative  . Not on file   Social Determinants of Health   Financial Resource Strain: Not on file  Food Insecurity: Not on file  Transportation Needs: Not on file  Physical Activity: Not on file  Stress: Not on file  Social Connections: Not on file  Intimate Partner Violence: Not on file     Review of Systems: Review of Systems  Constitutional: Positive for malaise/fatigue and weight loss.  Cardiovascular: Positive for leg swelling.  Gastrointestinal: Positive for abdominal pain, diarrhea, nausea and vomiting.  Neurological: Positive for weakness.  All other systems reviewed and are negative.   Vital Signs: Blood pressure 132/65, pulse 97, temperature 98.3 F (36.8 C), temperature source Oral, resp. rate 18, height 5\' 2"  (1.575 m), weight 93.9 kg, SpO2 96 %.  Weight trends: Filed Weights   04/24/21 1846  Weight: 93.9 kg    Physical Exam: General: NAD,  laying in bed, ill appearing  Head: Normocephalic, atraumatic. Dry oral mucosal membranes  Eyes: Anicteric  Lungs:  Clear to auscultation, normal breathing effort  Heart: Regular rate and rhythm  Abdomen:  Soft, tender, non distended, dry heaves  Extremities:  1+ peripheral edema., bilateral lymphedema  Neurologic: Nonfocal, moving all four extremities  Skin: No lesions        Lab results: Basic Metabolic Panel: Recent Labs  Lab 04/24/21 1943 04/25/21 0330  NA 135 136  K 6.2* 5.9*  CL 102 106  CO2 21* 20*  GLUCOSE 149* 141*  BUN 71* 66*  CREATININE 3.04* 3.00*  CALCIUM 9.4 9.2    Liver Function Tests: Recent Labs  Lab 04/24/21 1943  AST 42*  ALT 14  ALKPHOS 344*  BILITOT 1.1  PROT 8.2*  ALBUMIN 2.9*   Recent Labs  Lab 04/24/21 1943  LIPASE 49   No results for input(s): AMMONIA in the last 168 hours.  CBC: Recent Labs  Lab 04/24/21 1943 04/25/21 0330  WBC 13.5* 13.5*  NEUTROABS 11.5*  --   HGB 10.5* 10.8*  HCT 32.4* 33.9*  MCV 86.9 87.6  PLT 317 321    Cardiac Enzymes: No results for input(s): CKTOTAL, CKMB, CKMBINDEX, TROPONINI in the last 168 hours.  BNP: Invalid input(s): POCBNP  CBG: Recent Labs  Lab 04/25/21 0214 04/25/21 0826  GLUCAP 135* 134*    Microbiology: Results for orders placed or performed during the hospital encounter of 04/24/21  Resp Panel by RT-PCR (Flu A&B, Covid) Nasopharyngeal Swab     Status: None   Collection Time: 04/25/21 12:45 AM   Specimen: Nasopharyngeal Swab; Nasopharyngeal(NP) swabs  in vial transport medium  Result Value Ref Range Status   SARS Coronavirus 2 by RT PCR NEGATIVE NEGATIVE Final    Comment: (NOTE) SARS-CoV-2 target nucleic acids are NOT DETECTED.  The SARS-CoV-2 RNA is generally detectable in upper respiratory specimens during the acute phase of infection. The lowest concentration of SARS-CoV-2 viral copies this assay can detect is 138 copies/mL. A negative result does not preclude  SARS-Cov-2 infection and should not be used as the sole basis for treatment or other patient management decisions. A negative result may occur with  improper specimen collection/handling, submission of specimen other than nasopharyngeal swab, presence of viral mutation(s) within the areas targeted by this assay, and inadequate number of viral copies(<138 copies/mL). A negative result must be combined with clinical observations, patient history, and epidemiological information. The expected result is Negative.  Fact Sheet for Patients:  EntrepreneurPulse.com.au  Fact Sheet for Healthcare Providers:  IncredibleEmployment.be  This test is no t yet approved or cleared by the [place] FDA and  has been authorized for detection and/or diagnosis of SARS-CoV-2 by FDA under an Emergency Use Authorization (EUA). This EUA will remain  in effect (meaning this test can be used) for the duration of the COVID-19 declaration under Section 564(b)(1) of the Act, 21 U.S.C.section 360bbb-3(b)(1), unless the authorization is terminated  or revoked sooner.       Influenza A by PCR NEGATIVE NEGATIVE Final   Influenza B by PCR NEGATIVE NEGATIVE Final    Comment: (NOTE) The Xpert Xpress SARS-CoV-2/FLU/RSV plus assay is intended as an aid in the diagnosis of influenza from Nasopharyngeal swab specimens and should not be used as a sole basis for treatment. Nasal washings and aspirates are unacceptable for Xpert Xpress SARS-CoV-2/FLU/RSV testing.  Fact Sheet for Patients: EntrepreneurPulse.com.au  Fact Sheet for Healthcare Providers: IncredibleEmployment.be  This test is not yet approved or cleared by the [place] FDA and has been authorized for detection and/or diagnosis of SARS-CoV-2 by FDA under an Emergency Use Authorization (EUA). This EUA will remain in effect (meaning this test can be used) for the duration of  the COVID-19 declaration under Section 564(b)(1) of the Act, 21 U.S.C. section 360bbb-3(b)(1), unless the authorization is terminated or revoked.  Performed at [facility], [address] [place], [place] [address]     Coagulation Studies: No results for input(s): LABPROT, INR in the last 72 hours.  Urinalysis: Recent Labs    04/25/21 0330  COLORURINE YELLOW*  LABSPEC 1.015  PHURINE 5.0  GLUCOSEU NEGATIVE  HGBUR SMALL*  BILIRUBINUR NEGATIVE  KETONESUR 5*  PROTEINUR NEGATIVE  NITRITE NEGATIVE  LEUKOCYTESUR NEGATIVE      Imaging: CT ABDOMEN PELVIS WO CONTRAST  Result Date: 04/24/2021 CLINICAL DATA:  Left-sided abdominal pain EXAM: CT ABDOMEN AND PELVIS WITHOUT CONTRAST TECHNIQUE: Multidetector CT imaging of the abdomen and pelvis was performed following the standard protocol without IV contrast. COMPARISON:  CT 04/05/2021 FINDINGS: Lower chest: Lung bases demonstrate no acute consolidation or pleural effusion. Scattered pulmonary nodules measuring up to 5 mm in size are redemonstrated. Incompletely visualized hilar lymph nodes some of which are calcified. Hepatobiliary: Numerous low-attenuation liver masses consistent with metastatic disease. Slight increased gallbladder density which may be due to sludge or small stones. No biliary dilatation Pancreas: Unremarkable. No pancreatic ductal dilatation or surrounding inflammatory changes. Spleen: Normal in size without focal abnormality. Adrenals/Urinary Tract: Adrenal glands are within normal limits. 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 Small stone in the dilated renal collecting  system on the left. Urinary bladder is unremarkable. Stomach/Bowel: Stomach nonenlarged. No dilated small bowel. No acute bowel wall thickening. Vascular/Lymphatic: Moderate aortic atherosclerosis. No aneurysm. Right cardio phrenic adenopathy measuring up to 12 mm, previously 10 mm. Reproductive: Status post hysterectomy.  Other: No free air. Increased ascites within the abdomen and pelvis. Widespread peritoneal metastatic disease with large soft tissue mesenteric mass in the lower abdomen and upper pelvis. Nodular infiltration of left abdominal mesentery consistent with metastatic disease, this appears increased. Numerous masses within the left greater than right colic gutters and within perihepatic ascites fluid. Small ventral hernia containing fat and soft tissue masslike density. Loculated ascites adjacent to the rectum. Musculoskeletal: No acute or suspicious osseous abnormality. IMPRESSION: 1. Slight interval increase in abdominopelvic ascites since the prior exam. 2. Redemonstrated hepatic metastatic disease and widespread peritoneal metastatic disease 3. 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 Electronically Signed   By: Donavan Foil M.D.   On: 04/24/2021 23:56      Assessment & Plan: Ms. GHALIA REICKS is a 70 y.o.  female with , who was admitted to [facility] on 04/24/2021 for acute kidney injury with hyperkalemia. 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   1. Acute Kidney Injury with hyperkalemia likely due to dehydration and possibly urinary retention d/t horseshoe kidney Baseline creatinine 1.19 and GFR of 54 on 07/10/20.  Current creatinine 3.00 and potassium 5.9 Lokelma given IVF ordered No indication for dialysis at this time Supportive management Avoid nephrotoxic agents if possible Will continue to monitor  2. Diabetes mellitus type II  noninsulin dependent.  Most recent hemoglobin A1c is 7.6 on date.  Glipizide at home Stable during this admission Primary team to manage  Thank you for consult  LOS: 0 Yeira Gulden 5/4/202212:26 PM

## 2021-04-25 NOTE — ED Provider Notes (Signed)
  Physical Exam  BP 117/60   Pulse 96   Temp 98.1 F (36.7 C) (Oral)   Resp (!) 27   Ht 5\' 2"  (1.575 m)   Wt 93.9 kg   LMP  (LMP Unknown)   SpO2 97%   BMI 37.86 kg/m   Physical Exam  ED Course/Procedures     Procedures  MDM  11:00 PM  Assumed care from Dr. Cheri Fowler.  Please see his note for further details.  Patient sent here from [facility] living facility with complaints of left-sided abdominal pain that has waxed and waned for the past month, worse tonight.  Labs, urine, CT of the abdomen pelvis pending.  On review of her records, it appears patient has carcinosarcoma of the uterus, cirrhotic liver with diffuse hepatic metastasis and peritoneal metastasis.  She is scheduled for a liver biopsy.  Reports some intermittent vomiting, no diarrhea.  Decreased oral intake.  12:15 AM  Pt's labs show worsening renal disease with potassium of 6.2 without EKG changes.  Also has a leukocytosis.  Afebrile here.  CT scan shows increased in abdominal pelvic ascites since previous imaging and redemonstrated hepatic metastatic disease and widespread peritoneal metastatic disease.  Also has horseshoe kidney with worsening hydronephrosis of the left aspect of the kidney.  Urinalysis pending.  Will discuss with medicine for admission.  12:32 AM Discussed patient's case with hospitalist, Dr. Damita Dunnings.  I have recommended admission and patient (and family if present) agree with this plan. Admitting physician will place admission orders.   I reviewed all nursing notes, vitals, pertinent previous records and reviewed/interpreted all EKGs, lab and urine results, imaging (as available).       Date: 04/24/2021 23:58  Rate: 96  Rhythm: normal sinus rhythm  QRS Axis: normal  Intervals: normal  ST/T Wave abnormalities: normal  Conduction Disutrbances: Bifascicular block  Narrative Interpretation: Bifascicular block, similar to previous EKG, QTC 476 ms, no peak T waves      CRITICAL CARE Performed by:  Pryor Curia   Total critical care time: 45 minutes  Critical care time was exclusive of separately billable procedures and treating other patients.  Critical care was necessary to treat or prevent imminent or life-threatening deterioration.  Critical care was time spent personally by me on the following activities: development of treatment plan with patient and/or surrogate as well as nursing, discussions with consultants, evaluation of patient's response to treatment, examination of patient, obtaining history from patient or surrogate, ordering and performing treatments and interventions, ordering and review of laboratory studies, ordering and review of radiographic studies, pulse oximetry and re-evaluation of patient's condition.     Tawanna Funk, Delice Bison, DO 04/25/21 [phone]

## 2021-04-26 ENCOUNTER — Ambulatory Visit: Admission: RE | Admit: 2021-04-26 | Payer: Medicare Other | Source: Ambulatory Visit

## 2021-04-26 DIAGNOSIS — E875 Hyperkalemia: Secondary | ICD-10-CM | POA: Diagnosis not present

## 2021-04-26 DIAGNOSIS — Z515 Encounter for palliative care: Secondary | ICD-10-CM | POA: Diagnosis not present

## 2021-04-26 LAB — GLUCOSE, CAPILLARY
Glucose-Capillary: 139 mg/dL — ABNORMAL HIGH (ref 70–99)
Glucose-Capillary: 149 mg/dL — ABNORMAL HIGH (ref 70–99)
Glucose-Capillary: 161 mg/dL — ABNORMAL HIGH (ref 70–99)
Glucose-Capillary: 175 mg/dL — ABNORMAL HIGH (ref 70–99)
Glucose-Capillary: 197 mg/dL — ABNORMAL HIGH (ref 70–99)

## 2021-04-26 LAB — CBC WITH DIFFERENTIAL/PLATELET
Abs Immature Granulocytes: 0.21 10*3/uL — ABNORMAL HIGH (ref 0.00–0.07)
Basophils Absolute: 0 10*3/uL (ref 0.0–0.1)
Basophils Relative: 0 %
Eosinophils Absolute: 0 10*3/uL (ref 0.0–0.5)
Eosinophils Relative: 0 %
HCT: 30.6 % — ABNORMAL LOW (ref 36.0–46.0)
Hemoglobin: 9.8 g/dL — ABNORMAL LOW (ref 12.0–15.0)
Immature Granulocytes: 2 %
Lymphocytes Relative: 6 %
Lymphs Abs: 0.8 10*3/uL (ref 0.7–4.0)
MCH: 28.2 pg (ref 26.0–34.0)
MCHC: 32 g/dL (ref 30.0–36.0)
MCV: 87.9 fL (ref 80.0–100.0)
Monocytes Absolute: 1.1 10*3/uL — ABNORMAL HIGH (ref 0.1–1.0)
Monocytes Relative: 8 %
Neutro Abs: 10.9 10*3/uL — ABNORMAL HIGH (ref 1.7–7.7)
Neutrophils Relative %: 84 %
Platelets: 290 10*3/uL (ref 150–400)
RBC: 3.48 MIL/uL — ABNORMAL LOW (ref 3.87–5.11)
RDW: 17.3 % — ABNORMAL HIGH (ref 11.5–15.5)
WBC: 13 10*3/uL — ABNORMAL HIGH (ref 4.0–10.5)
nRBC: 0 % (ref 0.0–0.2)

## 2021-04-26 LAB — BASIC METABOLIC PANEL
Anion gap: 8 (ref 5–15)
BUN: 56 mg/dL — ABNORMAL HIGH (ref 8–23)
CO2: 20 mmol/L — ABNORMAL LOW (ref 22–32)
Calcium: 8.9 mg/dL (ref 8.9–10.3)
Chloride: 112 mmol/L — ABNORMAL HIGH (ref 98–111)
Creatinine, Ser: 2.19 mg/dL — ABNORMAL HIGH (ref 0.44–1.00)
GFR, Estimated: 24 mL/min — ABNORMAL LOW (ref >60)
Glucose, Bld: 137 mg/dL — ABNORMAL HIGH (ref 70–99)
Potassium: 5.2 mmol/L — ABNORMAL HIGH (ref 3.5–5.1)
Sodium: 140 mmol/L (ref 135–145)

## 2021-04-26 LAB — PROTIME-INR
INR: 1.1 (ref 0.8–1.2)
Prothrombin Time: 14.5 seconds (ref 11.4–15.2)

## 2021-04-26 MED ORDER — DEXAMETHASONE 4 MG PO TABS
4.0000 mg | ORAL_TABLET | Freq: Three times a day (TID) | ORAL | Status: DC
  Administered 2021-04-26 – 2021-04-30 (×13): 4 mg via ORAL
  Filled 2021-04-26 (×11): qty 1

## 2021-04-26 MED ORDER — BOOST / RESOURCE BREEZE PO LIQD CUSTOM
1.0000 | Freq: Three times a day (TID) | ORAL | Status: DC
  Administered 2021-04-26 – 2021-04-30 (×9): 1 via ORAL

## 2021-04-26 NOTE — TOC Initial Note (Signed)
Transition of Care [facility]) - Initial/Assessment Note    Patient Details  Name: [name] MRN: [id number] Date of Birth: 02-18-1951  Transition of Care [facility]) CM/SW Contact:    Shelbie Hutching, RN Phone Number: 04/26/2021, 3:38 PM  Clinical Narrative:                 Patient admitted to the hospital with abdominal patient and hyperkalemia.  RNCM met with patient at the bedside.  Patient is very lethargic after receiving pain medication.  Patient has eyes closed and will only answer that she lives at [facility] and has been there about 3 years.  RNCM will see patient again tomorrow to complete assessment.  Palliative has been consulted, and PT consult pending.  Patient has history of endometrial cancer with peritoneal and hepatic metastases.      Expected Discharge Plan: Assisted Living Barriers to Discharge: Continued Medical Work up   Patient Goals and CMS Choice Patient states their goals for this hospitalization and ongoing recovery are:: patient lethargic after receiving pain medication unable to state goals      Expected Discharge Plan and Services Expected Discharge Plan: Assisted Living   Discharge Planning Services: CM Consult   Living arrangements for the past 2 months: Assisted Living Facility                                      Prior Living Arrangements/Services Living arrangements for the past 2 months: [place] Lives with:: Facility Resident Patient language and need for interpreter reviewed:: Yes        Need for Family Participation in Patient Care: Yes (Comment) Care giver support system in place?: Yes (comment) (family and friend)   Criminal Activity/Legal Involvement Pertinent to Current Situation/Hospitalization: No - Comment as needed  Activities of Daily Living Home Assistive Devices/Equipment: Environmental consultant (specify type) ADL Screening (condition at time of admission) Patient's cognitive ability adequate to safely complete daily  activities?: Yes Is the patient deaf or have difficulty hearing?: No Does the patient have difficulty seeing, even when wearing glasses/contacts?: No Does the patient have difficulty concentrating, remembering, or making decisions?: Yes Patient able to express need for assistance with ADLs?: Yes Does the patient have difficulty dressing or bathing?: Yes Independently performs ADLs?: No Communication: Independent with device (comment),Needs assistance Is this a change from baseline?: Pre-admission baseline Dressing (OT): Needs assistance Is this a change from baseline?: Pre-admission baseline Grooming: Independent Is this a change from baseline?: Pre-admission baseline Feeding: Independent Bathing: Needs assistance Is this a change from baseline?: Pre-admission baseline Toileting: Needs assistance Is this a change from baseline?: Pre-admission baseline In/Out Bed: Needs assistance Is this a change from baseline?: Pre-admission baseline Walks in Home: Needs assistance,Independent with device (comment) Is this a change from baseline?: Pre-admission baseline Does the patient have difficulty walking or climbing stairs?: Yes Weakness of Legs: Both Weakness of Arms/Hands: Both  Permission Sought/Granted Permission sought to share information with : Case Manager,Family Supports Permission granted to share information with : Yes, Verbal Permission Granted  Share Information with NAME: Customer service manager  Permission granted to share info w AGENCY: [employer] granted to share info w Relationship: cousin     Emotional Assessment Appearance:: Appears older than stated age Attitude/Demeanor/Rapport: Lethargic   Orientation: : Oriented to Self Alcohol / Substance Use: Not Applicable Psych Involvement: No (comment)  Admission diagnosis:  Hyperkalemia [E87.5]  Hepatic metastasis ([place]) [C78.7] Other ascites [R18.8] Peritoneal metastases ([place]) [C78.6] Ascites [R18.8] Acute kidney  injury superimposed on chronic kidney disease ([place]) [N17.9, N18.9] Abdominal pain [R10.9] Patient Active Problem List   Diagnosis Date Noted  . Hyperkalemia 04/25/2021  . Hydronephrosis 04/25/2021  . Abdominal pain 04/25/2021  . Constipation 04/18/2021  . Gastro-esophageal reflux disease without esophagitis 04/18/2021  . Hyperglycemia due to type 2 diabetes mellitus ([place]) 04/18/2021  . Endometrial cancer ([place]) 07/13/2020  . Carcinosarcoma of endometrium ([place]) 06/22/2020  . Palliative care encounter 01/06/2019  . Localized edema 01/06/2019  . Shortness of breath 01/06/2019  . Hypertension 08/25/2018  . Cellulitis of lower extremity 04/06/2018  . Mild protein-calorie malnutrition ([place]) 04/06/2018  . Pressure injury of skin 03/27/2018  . Sepsis ([place]) 03/26/2018  . Diabetic infection of left foot ([place]) 03/26/2018  . AKI (acute kidney injury) ([place]) 03/26/2018  . Venous ulcer of left leg ([place]) 11/18/2016  . Chronic venous insufficiency 11/18/2016  . Lymphedema 11/18/2016  . Swelling of limb 11/18/2016  . Type 2 diabetes mellitus with insulin therapy ([place]) 11/18/2016  . Hyperlipidemia 11/18/2016   PCP:  Kirk Ruths, MD Pharmacy:   [facility], [place] [address] Phone: [phone] Fax: [phone]     Social Determinants of Health (SDOH) Interventions    Readmission Risk Interventions No flowsheet data found.

## 2021-04-26 NOTE — Progress Notes (Signed)
Initial Nutrition Assessment  DOCUMENTATION CODES:  Obesity unspecified  INTERVENTION:   Continue current diet as ordered  Boost Breeze po TID, each supplement provides 250 kcal and 9 grams of protein  NUTRITION DIAGNOSIS:  Inadequate oral intake related to decreased appetite as evidenced by meal completion < 25%.  GOAL:  Patient will meet greater than or equal to 90% of their needs  MONITOR:  PO intake,Supplement acceptance,I & O's  REASON FOR ASSESSMENT:  Malnutrition Screening Tool    ASSESSMENT:  Pt presented to ED 5/3 from nursing facility with worsening abdominal pain and intermittent vomiting. Reports decreased PO intake and weakness. 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  Pt resting in bed at the time of visit. Breakfast tray at bedside, untouched. Pt having a hard time focusing on conversation, did not answer the majority or questions, very focused on her menu. Did not seem interested in discussing supplements or nutrition at this time. States she likes cranberry flavored supplements. Will add boost breeze. Offered to move tray closer to pt so she could eat her breakfast, declined. Insignificant weight loss of 8.7% x 9 months. Some changes could be related to fluid.  Pt was scheduled to have a liver biopsy this morning but IR felt she was too high risk as she re-accumulated fluid quickly overnight since paracentesis. Opted to wait for cytology results before determining if biopsy is necessary.   5/4 - Paracentesis, 1.75 L of serosanguineous fluid removed   Average Meal Intake: Marland Kitchen 5/3-5/5: 0% intake x 2 recorded meals  Relevant Scheduled Meds: . dexamethasone  4 mg Oral Q8H  . insulin aspart  0-15 Units Subcutaneous TID WC  . insulin aspart  0-5 Units Subcutaneous QHS   Relevant Continuous Infusions: . dextrose 5 % and 0.9% NaCl 100 mL/hr at  04/26/21 0943   Relevant PRN Meds: ondansetron  Labs reviewed:  K 5.2 (received lokelma)  BUN 56, creatinine 2.19 (improving)  SBG ranges from 75-149 mg/dL over the last 24 hours  HgbA1c 7.6% (5/4)  NUTRITION - FOCUSED PHYSICAL EXAM:  *Adipose tissue and fluid may be masking signs of depletions Flowsheet Row Most Recent Value  Orbital Region No depletion  Upper Arm Region No depletion  Thoracic and Lumbar Region No depletion  Buccal Region No depletion  Temple Region No depletion  Clavicle Bone Region No depletion  Clavicle and Acromion Bone Region Mild depletion  Scapular Bone Region No depletion  Dorsal Hand Mild depletion  Patellar Region No depletion  Anterior Thigh Region No depletion  Posterior Calf Region No depletion  Edema (RD Assessment) Moderate  [bilateral legs]  Hair Unable to assess  [hat on]  Eyes Reviewed  Mouth Reviewed  [reports tooth loose]  Skin Reviewed  Nails Reviewed     Diet Order:   Diet Order            Diet Carb Modified Fluid consistency: Thin; Room service appropriate? Yes  Diet effective now                EDUCATION NEEDS:  Education needs have been addressed  Skin:  Skin Assessment: Reviewed RN Assessment  Last BM:  5/3 per RN documentation  Height:  Ht Readings from Last 1 Encounters:  04/24/21 5\' 2"  (1.575 m)    Weight:  Wt Readings from Last 1 Encounters:  04/24/21 93.9 kg    Ideal Body Weight:  50 kg  BMI:  Body  mass index is 37.86 kg/m.  Estimated Nutritional Needs:   Kcal:  1600-1800 kcal/d  Protein:  80-90 g/d  Fluid:  >1677mL/d   Ranell Patrick, RD, LDN Clinical Dietitian Pager on [place]

## 2021-04-26 NOTE — Evaluation (Signed)
Physical Therapy Evaluation Patient Details Name: [name] MRN: [id number] DOB: 1951-12-04 Today's Date: 04/26/2021   History of Present Illness  Pt admitted for hyperkalemia. 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 Currently complains of severe stomach pain.  Clinical Impression  Pt is a pleasant 70 year old female who was admitted for hyperkalemia. Pt performs bed mobility/transfers with mod/max assist and then required +2 for return. Unable to further perform mobility. Pt demonstrates deficits with strength/mobility/pain in mobility. Pt is very confused. Would benefit from skilled PT to address above deficits and promote optimal return to PLOF; recommend transition to STR upon discharge from acute hospitalization.     Follow Up Recommendations SNF    Equipment Recommendations  None recommended by PT    Recommendations for Other Services       Precautions / Restrictions Precautions Precautions: Fall Restrictions Weight Bearing Restrictions: No      Mobility  Bed Mobility Overal bed mobility: Needs Assistance Bed Mobility: Supine to Sit     Supine to sit: Mod assist;Max assist     General bed mobility comments: needs heavy assist for sequencing. Able to get majority of the way seated, prior to fatigue and requesting to return back to bed. +2 called and needed for return back supine and repositioned.    Transfers                 General transfer comment: unable to perform at this time  Ambulation/Gait                Stairs            Wheelchair Mobility    Modified Rankin (Stroke Patients Only)       Balance Overall balance assessment: Needs assistance Sitting-balance support: Feet supported;Bilateral upper extremity supported Sitting balance-Leahy Scale: Poor Sitting balance - Comments: post lean present                                     Pertinent  Vitals/Pain Pain Assessment: Faces Faces Pain Scale: Hurts even more Pain Location: stomach Pain Descriptors / Indicators: Aching Pain Intervention(s): Limited activity within patient's tolerance;Repositioned    Home Living Family/patient expects to be discharged to:: Assisted living               Home Equipment: Walker - 2 wheels Additional Comments: Pt is poor historian, unsure of accurate history    Prior Function Level of Independence: Independent with assistive device(s)         Comments: per chart, was previously indep using RW. Pt reports she last ambulated on Monday.     Hand Dominance        Extremity/Trunk Assessment   Upper Extremity Assessment Upper Extremity Assessment: Generalized weakness (B UE grossly 3-/5)    Lower Extremity Assessment Lower Extremity Assessment: Generalized weakness (L LE grossly 2/5; R LE grossly 3/5)       Communication   Communication: No difficulties  Cognition Arousal/Alertness: Awake/alert Behavior During Therapy: WFL for tasks assessed/performed Overall Cognitive Status: No family/caregiver present to determine baseline cognitive functioning                                        General Comments      Exercises Other Exercises Other  Exercises: supine ther-ex on B LE including SLRs, AP, and hip add squeezes. All ther-ex performed x 5-8 reps with mod assist.   Assessment/Plan    PT Assessment Patient needs continued PT services  PT Problem List Decreased balance;Decreased strength;Decreased mobility;Decreased safety awareness       PT Treatment Interventions Gait training;DME instruction;Therapeutic exercise;Balance training    PT Goals (Current goals can be found in the Care Plan section)  Acute Rehab PT Goals Patient Stated Goal: to get stronger PT Goal Formulation: With patient Time For Goal Achievement: 05/10/21 Potential to Achieve Goals: Good    Frequency Min 2X/week   Barriers to  discharge        Co-evaluation               AM-PAC PT "6 Clicks" Mobility  Outcome Measure Help needed turning from your back to your side while in a flat bed without using bedrails?: A Little Help needed moving from lying on your back to sitting on the side of a flat bed without using bedrails?: A Lot Help needed moving to and from a bed to a chair (including a wheelchair)?: Total Help needed standing up from a chair using your arms (e.g., wheelchair or bedside chair)?: Total Help needed to walk in hospital room?: Total Help needed climbing 3-5 steps with a railing? : Total 6 Click Score: 9    End of Session   Activity Tolerance: Patient tolerated treatment well Patient left: in bed;with bed alarm set Nurse Communication: Mobility status PT Visit Diagnosis: Unsteadiness on feet (R26.81);Muscle weakness (generalized) (M62.81);Difficulty in walking, not elsewhere classified (R26.2);Pain Pain - Right/Left:  (abdomen) Pain - part of body:  (stomach)    Time: 9326-7124 PT Time Calculation (min) (ACUTE ONLY): 18 min   Charges:   PT Evaluation $PT Eval Low Complexity: 1 Low PT Treatments $Therapeutic Exercise: 8-22 mins        [name], PT, DPT [phone]   [name] 04/26/2021, 4:19 PM

## 2021-04-26 NOTE — Progress Notes (Signed)
[facility]  ROUNDING NOTE   Subjective:   Patient seen resting quietly in bed Alert, oriented and able to answer questions Continues to complain of nausea but denies vomiting  Objective:  Vital signs in last 24 hours:  Temp:  [97.6 F (36.4 C)-98.5 F (36.9 C)] 98.3 F (36.8 C) (05/05 0809) Pulse Rate:  [95-103] 103 (05/05 0809) Resp:  [16-19] 17 (05/05 0809) BP: (117-146)/(59-82) 121/61 (05/05 0809) SpO2:  [92 %-100 %] 94 % (05/05 0809)  Weight change:  Filed Weights   04/24/21 1846  Weight: 93.9 kg    Intake/Output: I/O last 3 completed shifts: In: 1468.8 [P.O.:180; I.V.:1288.8] Out: 450 [Urine:450]   Intake/Output this shift:  No intake/output data recorded.  Physical Exam: General: NAD, laying in bed  Head: Normocephalic, atraumatic. Moist oral mucosal membranes  Eyes: Anicteric  Lungs:  Clear to auscultation, normal breathing effort  Heart: Regular rate and rhythm  Abdomen:  Soft, tender  Extremities:  no peripheral edema.  Neurologic: Nonfocal, moving all four extremities  Skin: No lesions       Basic Metabolic Panel: Recent Labs  Lab 04/24/21 1943 04/25/21 0330 04/26/21 0519  NA 135 136 140  K 6.2* 5.9* 5.2*  CL 102 106 112*  CO2 21* 20* 20*  GLUCOSE 149* 141* 137*  BUN 71* 66* 56*  CREATININE 3.04* 3.00* 2.19*  CALCIUM 9.4 9.2 8.9    Liver Function Tests: Recent Labs  Lab 04/24/21 1943  AST 42*  ALT 14  ALKPHOS 344*  BILITOT 1.1  PROT 8.2*  ALBUMIN 2.9*   Recent Labs  Lab 04/24/21 1943  LIPASE 49   No results for input(s): AMMONIA in the last 168 hours.  CBC: Recent Labs  Lab 04/24/21 1943 04/25/21 0330 04/26/21 0519  WBC 13.5* 13.5* 13.0*  NEUTROABS 11.5*  --  10.9*  HGB 10.5* 10.8* 9.8*  HCT 32.4* 33.9* 30.6*  MCV 86.9 87.6 87.9  PLT 317 321 290    Cardiac Enzymes: No results for input(s): CKTOTAL, CKMB, CKMBINDEX, TROPONINI in the last 168 hours.  BNP: Invalid input(s): POCBNP  CBG: Recent  Labs  Lab 04/25/21 1320 04/25/21 1617 04/25/21 2110 04/26/21 0001 04/26/21 0804  GLUCAP 134* 106* 75 139* 149*    Microbiology: Results for orders placed or performed during the hospital encounter of 04/24/21  Resp Panel by RT-PCR (Flu A&B, Covid) Nasopharyngeal Swab     Status: None   Collection Time: 04/25/21 12:45 AM   Specimen: Nasopharyngeal Swab; Nasopharyngeal(NP) swabs in vial transport medium  Result Value Ref Range Status   SARS Coronavirus 2 by RT PCR NEGATIVE NEGATIVE Final    Comment: (NOTE) SARS-CoV-2 target nucleic acids are NOT DETECTED.  The SARS-CoV-2 RNA is generally detectable in upper respiratory specimens during the acute phase of infection. The lowest concentration of SARS-CoV-2 viral copies this assay can detect is 138 copies/mL. A negative result does not preclude SARS-Cov-2 infection and should not be used as the sole basis for treatment or other patient management decisions. A negative result may occur with  improper specimen collection/handling, submission of specimen other than nasopharyngeal swab, presence of viral mutation(s) within the areas targeted by this assay, and inadequate number of viral copies(<138 copies/mL). A negative result must be combined with clinical observations, patient history, and epidemiological information. The expected result is Negative.  Fact Sheet for Patients:  EntrepreneurPulse.com.au  Fact Sheet for Healthcare Providers:  IncredibleEmployment.be  This test is no t yet approved or cleared by the [place]  FDA and  has been authorized for detection and/or diagnosis of SARS-CoV-2 by FDA under an Emergency Use Authorization (EUA). This EUA will remain  in effect (meaning this test can be used) for the duration of the COVID-19 declaration under Section 564(b)(1) of the Act, 21 U.S.C.section 360bbb-3(b)(1), unless the authorization is terminated  or revoked sooner.        Influenza A by PCR NEGATIVE NEGATIVE Final   Influenza B by PCR NEGATIVE NEGATIVE Final    Comment: (NOTE) The Xpert Xpress SARS-CoV-2/FLU/RSV plus assay is intended as an aid in the diagnosis of influenza from Nasopharyngeal swab specimens and should not be used as a sole basis for treatment. Nasal washings and aspirates are unacceptable for Xpert Xpress SARS-CoV-2/FLU/RSV testing.  Fact Sheet for Patients: EntrepreneurPulse.com.au  Fact Sheet for Healthcare Providers: IncredibleEmployment.be  This test is not yet approved or cleared by the [place] FDA and has been authorized for detection and/or diagnosis of SARS-CoV-2 by FDA under an Emergency Use Authorization (EUA). This EUA will remain in effect (meaning this test can be used) for the duration of the COVID-19 declaration under Section 564(b)(1) of the Act, 21 U.S.C. section 360bbb-3(b)(1), unless the authorization is terminated or revoked.  Performed at [facility], [address] [place], [place] [address]   Body fluid culture w Gram Stain     Status: None (Preliminary result)   Collection Time: 04/25/21 12:10 PM   Specimen: PATH Cytology Peritoneal fluid  Result Value Ref Range Status   Specimen Description   Final    PERITONEAL Performed at [facility], [address], [place], [place] [address]    Special Requests   Final    NONE Performed at [facility], [address] [place], [place] [address]    Gram Stain   Final    MODERATE WBC PRESENT,BOTH PMN AND MONONUCLEAR NO ORGANISMS SEEN    Culture   Final    NO GROWTH < 24 HOURS Performed at [facility], [place] [address], [place], [place] [address]    Report Status PENDING  Incomplete  MRSA PCR Screening     Status: None   Collection Time: 04/25/21  3:58 PM   Specimen: Nasopharyngeal  Result Value Ref Range Status   MRSA by PCR NEGATIVE NEGATIVE Final    Comment:        The  GeneXpert MRSA Assay (FDA approved for NASAL specimens only), is one component of a comprehensive MRSA colonization surveillance program. It is not intended to diagnose MRSA infection nor to guide or monitor treatment for MRSA infections. Performed at [facility], [address] [place], [place] [address]     Coagulation Studies: Recent Labs    04/26/21 0519  LABPROT 14.5  INR 1.1    Urinalysis: Recent Labs    04/25/21 0330  COLORURINE YELLOW*  LABSPEC 1.015  PHURINE 5.0  GLUCOSEU NEGATIVE  HGBUR SMALL*  BILIRUBINUR NEGATIVE  KETONESUR 5*  PROTEINUR NEGATIVE  NITRITE NEGATIVE  LEUKOCYTESUR NEGATIVE      Imaging: CT ABDOMEN PELVIS WO CONTRAST  Result Date: 04/24/2021 CLINICAL DATA:  Left-sided abdominal pain EXAM: CT ABDOMEN AND PELVIS WITHOUT CONTRAST TECHNIQUE: Multidetector CT imaging of the abdomen and pelvis was performed following the standard protocol without IV contrast. COMPARISON:  CT 04/05/2021 FINDINGS: Lower chest: Lung bases demonstrate no acute consolidation or pleural effusion. Scattered pulmonary nodules measuring up to 5 mm in size are redemonstrated. Incompletely visualized hilar lymph nodes some of which  are calcified. Hepatobiliary: Numerous low-attenuation liver masses consistent with metastatic disease. Slight increased gallbladder density which may be due to sludge or small stones. No biliary dilatation Pancreas: Unremarkable. No pancreatic ductal dilatation or surrounding inflammatory changes. Spleen: Normal in size without focal abnormality. Adrenals/Urinary Tract: Adrenal glands are within normal limits. 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 Small stone in the dilated renal collecting system on the left. Urinary bladder is unremarkable. Stomach/Bowel: Stomach nonenlarged. No dilated small bowel. No acute bowel wall thickening. Vascular/Lymphatic: Moderate aortic atherosclerosis. No aneurysm. Right cardio phrenic  adenopathy measuring up to 12 mm, previously 10 mm. Reproductive: Status post hysterectomy. Other: No free air. Increased ascites within the abdomen and pelvis. Widespread peritoneal metastatic disease with large soft tissue mesenteric mass in the lower abdomen and upper pelvis. Nodular infiltration of left abdominal mesentery consistent with metastatic disease, this appears increased. Numerous masses within the left greater than right colic gutters and within perihepatic ascites fluid. Small ventral hernia containing fat and soft tissue masslike density. Loculated ascites adjacent to the rectum. Musculoskeletal: No acute or suspicious osseous abnormality. IMPRESSION: 1. Slight interval increase in abdominopelvic ascites since the prior exam. 2. Redemonstrated hepatic metastatic disease and widespread peritoneal metastatic disease 3. 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 Electronically Signed   By: Donavan Foil M.D.   On: 04/24/2021 23:56   US Paracentesis  Result Date: 04/25/2021 INDICATION: 70 year old female with a history of abdominal pain and new ascites EXAM: ULTRASOUND GUIDED  PARACENTESIS MEDICATIONS: None. COMPLICATIONS: None PROCEDURE: Informed written consent was obtained from the patient after a discussion of the risks, benefits and alternatives to treatment. A timeout was performed prior to the initiation of the procedure. Initial ultrasound scanning demonstrates a small amount of ascites within the right lower abdominal quadrant. The right lower abdomen was prepped and draped in the usual sterile fashion. 1% lidocaine was used for local anesthesia. Following this, a 8 Fr Safe-T-Centesis catheter was introduced. An ultrasound image was saved for documentation purposes. The paracentesis was performed. The catheter was removed and a dressing was applied. The patient tolerated the procedure well without immediate post procedural complication. FINDINGS: A total  of approximately 1.75 L of serosanguineous fluid was removed. Samples were sent to the laboratory as requested by the clinical team. IMPRESSION: Status post ultrasound-guided paracentesis. Signed, Dulcy Fanny. Dellia Nims, RPVI [facility] Radiology Electronically Signed   By: Corrie Mckusick D.O.   On: 04/25/2021 12:28     Medications:   . dextrose 5 % and 0.9% NaCl 100 mL/hr at 04/25/21 2157   . dexamethasone  4 mg Oral Q8H  . heparin  5,000 Units Subcutaneous Q8H  . insulin aspart  0-15 Units Subcutaneous TID WC  . insulin aspart  0-5 Units Subcutaneous QHS  . pneumococcal 23 valent vaccine  0.5 mL Intramuscular Tomorrow-1000   acetaminophen **OR** acetaminophen, HYDROcodone-acetaminophen, HYDROmorphone (DILAUDID) injection, ondansetron **OR** ondansetron (ZOFRAN) IV  Assessment/ Plan:  [name] is a 70 y.o.  female who was admitted to [facility] on 04/24/2021 for acute kidney injury with hyperkalemia. 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   1. Acute Kidney Injury with hyperkalemia likely due to dehydration and possible urinary retention d/t horseshoe kidney with baseline creatinine 1.19 and GFR of 54 on 07/10/20.  Creatinine improved Potassium  improved 5.2 Lokelma given yesterday Continue IVF May consider consulting Urology to evaluation worsening hydronephrosis on left side of horseshoe kidney, seen on CT.  No immediate need for dialysis Will continue to monitor  Lab Results  Component Value Date   CREATININE 2.19 (H) 04/26/2021   CREATININE 3.00 (H) 04/25/2021   CREATININE 3.04 (H) 04/24/2021    Intake/Output Summary (Last 24 hours) at 04/26/2021 0928 Last data filed at 04/26/2021 0622 Gross per 24 hour  Intake 1468.78 ml  Output 450 ml  Net 1018.78 ml   2.  Diabetes mellitus type 2 noninsulin dependent Hgb A1c 7.6 on 04/25/21 Glucose stable     LOS: 1 [name] 5/5/20229:28 AM

## 2021-04-26 NOTE — Progress Notes (Signed)
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 intake.  Patient's pain was addressed with use of intravenous and oral narcotics with short-lived results.  Hyperkalemia is persistent despite Lokelma and aggressive IV fluids.  Will redose Lokelma 10 g x 1 continue IV fluids.  Recheck creatinine in the morning.  Nephrology consult requested.  Also discussed the case with oncology and palliative care.  Patient was seen by her known oncologist Dr. Grayland Harrison as well as [name] from palliative care service.  She has a DNR status after her discussions with [name].  Will attempt to optimize pain regimen with use of oral Decadron for anti-inflammatory effect and IV hydromorphone.  5/5: Patient was seen in consultation by oncology, palliative care.  She was scheduled for liver biopsy today however after interventional radiology evaluation the patient was determined to be too high risk as she is reaccumulating ascites.  Liver biopsy on hold for now.  Attempting to optimize pain regimen.  Discussed with nephrology, imaging demonstrates worsening of known left-sided hydronephrosis.  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 intake endometrial carcinoma with hepatic and peritoneal metastatic deposits Patient status post paracentesis on 5/4, 1.75 L serosanguineous fluid obtained Leukocytosis improving Biopsy scheduled 5/5 cannot be performed due to reaccumulating ascites Plan: Continue multimodal pain control IV antiemetics as needed IV fluids Palliative care consulted, recommendations appreciated Made oncology aware that biopsy was not performed today  Type 2 diabetes mellitus Patient on oral antihyperglycemic's at home Plan: Hold oral medications Sliding scale coverage Carb modified diet  Essential hypertension Home blood pressure agents on hold  DVT prophylaxis: SQ heparin Code Status: DNR Family Communication: None, attempted to call niece, no answer Disposition Plan: Status is: Inpatient  Remains inpatient appropriate because:Inpatient level of care appropriate due to severity of illness   Dispo: The patient is from: Home              Anticipated d/c is to: Home              Patient currently is not medically stable to d/c.   Difficult to place patient No  Remains inpatient due to need for pain control.  Attempted optimize pain regimen.     Level of care: Med-Surg  Consultants:   Oncology  Nephrology  Palliative care  Procedures:   Paracentesis, 5/4  Antimicrobials:   None   Subjective: Seen and examined.  Continues to wince but endorses interval improvement in abdominal pain.  Seems  reluctant to take pain medications.  Objective: Vitals:   04/25/21 1944 04/26/21 0001 04/26/21 0444  04/26/21 0809  BP: 117/63 (!) 146/82 127/65 121/61  Pulse: 95 99 (!) 103 (!) 103  Resp: 19 17 16 17   Temp: 98.5 F (36.9 C) 97.7 F (36.5 C) 97.8 F (36.6 C) 98.3 F (36.8 C)  TempSrc:   Oral Oral  SpO2: 98% 100% 92% 94%  Weight:      Height:        Intake/Output Summary (Last 24 hours) at 04/26/2021 1022 Last data filed at 04/26/2021 0626 Gross per 24 hour  Intake 1468.78 ml  Output 450 ml  Net 1018.78 ml   Filed Weights   04/24/21 1846  Weight: 93.9 kg    Examination:  General exam: Mild distress due to pain Respiratory system: Clear to auscultation. Respiratory effort normal. Cardiovascular system: S1 & S2 heard, RRR. No JVD, murmurs, rubs, gallops or clicks. No pedal edema. Gastrointestinal system: Nondistended, tender to palpation, normal bowel sounds  Central nervous system: Alert and oriented. No focal neurological deficits. Extremities: Symmetric 5 x 5 power. Skin: No rashes, lesions or ulcers Psychiatry: Judgement and insight appear normal. Mood & affect appropriate.     Data Reviewed: I have personally reviewed following labs and imaging studies  CBC: Recent Labs  Lab 04/24/21 1943 04/25/21 0330 04/26/21 0519  WBC 13.5* 13.5* 13.0*  NEUTROABS 11.5*  --  10.9*  HGB 10.5* 10.8* 9.8*  HCT 32.4* 33.9* 30.6*  MCV 86.9 87.6 87.9  PLT 317 321 948   Basic Metabolic Panel: Recent Labs  Lab 04/24/21 1943 04/25/21 0330 04/26/21 0519  NA 135 136 140  K 6.2* 5.9* 5.2*  CL 102 106 112*  CO2 21* 20* 20*  GLUCOSE 149* 141* 137*  BUN 71* 66* 56*  CREATININE 3.04* 3.00* 2.19*  CALCIUM 9.4 9.2 8.9   GFR: Estimated Creatinine Clearance: 25.9 mL/min (A) (by C-G formula based on SCr of 2.19 mg/dL (H)). Liver Function Tests: Recent Labs  Lab 04/24/21 1943  AST 42*  ALT 14  ALKPHOS 344*  BILITOT 1.1  PROT 8.2*  ALBUMIN 2.9*   Recent Labs  Lab 04/24/21 1943  LIPASE 49   No results for input(s): AMMONIA in the last 168 hours. Coagulation  Profile: Recent Labs  Lab 04/26/21 0519  INR 1.1   Cardiac Enzymes: No results for input(s): CKTOTAL, CKMB, CKMBINDEX, TROPONINI in the last 168 hours. BNP (last 3 results) No results for input(s): PROBNP in the last 8760 hours. HbA1C: Recent Labs    04/25/21 0330  HGBA1C 7.6*   CBG: Recent Labs  Lab 04/25/21 1320 04/25/21 1617 04/25/21 2110 04/26/21 0001 04/26/21 0804  GLUCAP 134* 106* 75 139* 149*   Lipid Profile: No results for input(s): CHOL, HDL, LDLCALC, TRIG, CHOLHDL, LDLDIRECT in the last 72 hours. Thyroid Function Tests: No results for input(s): TSH, T4TOTAL, FREET4, T3FREE, THYROIDAB in the last 72 hours. Anemia Panel: No results for input(s): VITAMINB12, FOLATE, FERRITIN, TIBC, IRON, RETICCTPCT in the last 72 hours. Sepsis Labs: Recent Labs  Lab 04/24/21 1943  LATICACIDVEN 1.7    Recent Results (from the past 240 hour(s))  Resp Panel by RT-PCR (Flu A&B, Covid) Nasopharyngeal Swab     Status: None   Collection Time: 04/25/21 12:45 AM   Specimen: Nasopharyngeal Swab; Nasopharyngeal(NP) swabs in vial transport medium  Result Value Ref Range Status   SARS Coronavirus 2 by RT PCR NEGATIVE NEGATIVE Final    Comment: (NOTE) SARS-CoV-2 target nucleic  acids are NOT DETECTED.  The SARS-CoV-2 RNA is generally detectable in upper respiratory specimens during the acute phase of infection. The lowest concentration of SARS-CoV-2 viral copies this assay can detect is 138 copies/mL. A negative result does not preclude SARS-Cov-2 infection and should not be used as the sole basis for treatment or other patient management decisions. A negative result may occur with  improper specimen collection/handling, submission of specimen other than nasopharyngeal swab, presence of viral mutation(s) within the areas targeted by this assay, and inadequate number of viral copies(<138 copies/mL). A negative result must be combined with clinical observations, patient history, and  epidemiological information. The expected result is Negative.  Fact Sheet for Patients:  EntrepreneurPulse.com.au  Fact Sheet for Healthcare Providers:  IncredibleEmployment.be  This test is no t yet approved or cleared by the [place] FDA and  has been authorized for detection and/or diagnosis of SARS-CoV-2 by FDA under an Emergency Use Authorization (EUA). This EUA will remain  in effect (meaning this test can be used) for the duration of the COVID-19 declaration under Section 564(b)(1) of the Act, 21 U.S.C.section 360bbb-3(b)(1), unless the authorization is terminated  or revoked sooner.       Influenza A by PCR NEGATIVE NEGATIVE Final   Influenza B by PCR NEGATIVE NEGATIVE Final    Comment: (NOTE) The Xpert Xpress SARS-CoV-2/FLU/RSV plus assay is intended as an aid in the diagnosis of influenza from Nasopharyngeal swab specimens and should not be used as a sole basis for treatment. Nasal washings and aspirates are unacceptable for Xpert Xpress SARS-CoV-2/FLU/RSV testing.  Fact Sheet for Patients: EntrepreneurPulse.com.au  Fact Sheet for Healthcare Providers: IncredibleEmployment.be  This test is not yet approved or cleared by the [place] FDA and has been authorized for detection and/or diagnosis of SARS-CoV-2 by FDA under an Emergency Use Authorization (EUA). This EUA will remain in effect (meaning this test can be used) for the duration of the COVID-19 declaration under Section 564(b)(1) of the Act, 21 U.S.C. section 360bbb-3(b)(1), unless the authorization is terminated or revoked.  Performed at [facility], [address] [place], [place] [address]   Body fluid culture w Gram Stain     Status: None (Preliminary result)   Collection Time: 04/25/21 12:10 PM   Specimen: PATH Cytology Peritoneal fluid  Result Value Ref Range Status   Specimen Description   Final     PERITONEAL Performed at [facility], [address] [place], [place] [address]    Special Requests   Final    NONE Performed at [facility], [address] [place], [place] [address]    Gram Stain   Final    MODERATE WBC PRESENT,BOTH PMN AND MONONUCLEAR NO ORGANISMS SEEN    Culture   Final    NO GROWTH < 24 HOURS Performed at [facility], [address], [place], [place] [address]    Report Status PENDING  Incomplete  MRSA PCR Screening     Status: None   Collection Time: 04/25/21  3:58 PM   Specimen: Nasopharyngeal  Result Value Ref Range Status   MRSA by PCR NEGATIVE NEGATIVE Final    Comment:        The GeneXpert MRSA Assay (FDA approved for NASAL specimens only), is one component of a comprehensive MRSA colonization surveillance program. It is not intended to diagnose MRSA infection nor to guide or monitor treatment for MRSA infections. Performed at [facility], [address], [place], [place] [address]  Radiology Studies: CT ABDOMEN PELVIS WO CONTRAST  Result Date: 04/24/2021 CLINICAL DATA:  Left-sided abdominal pain EXAM: CT ABDOMEN AND PELVIS WITHOUT CONTRAST TECHNIQUE: Multidetector CT imaging of the abdomen and pelvis was performed following the standard protocol without IV contrast. COMPARISON:  CT 04/05/2021 FINDINGS: Lower chest: Lung bases demonstrate no acute consolidation or pleural effusion. Scattered pulmonary nodules measuring up to 5 mm in size are redemonstrated. Incompletely visualized hilar lymph nodes some of which are calcified. Hepatobiliary: Numerous low-attenuation liver masses consistent with metastatic disease. Slight increased gallbladder density which may be due to sludge or small stones. No biliary dilatation Pancreas: Unremarkable. No pancreatic ductal dilatation or surrounding inflammatory changes. Spleen: Normal in size without focal abnormality. Adrenals/Urinary Tract: Adrenal glands are  within normal limits. 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 Small stone in the dilated renal collecting system on the left. Urinary bladder is unremarkable. Stomach/Bowel: Stomach nonenlarged. No dilated small bowel. No acute bowel wall thickening. Vascular/Lymphatic: Moderate aortic atherosclerosis. No aneurysm. Right cardio phrenic adenopathy measuring up to 12 mm, previously 10 mm. Reproductive: Status post hysterectomy. Other: No free air. Increased ascites within the abdomen and pelvis. Widespread peritoneal metastatic disease with large soft tissue mesenteric mass in the lower abdomen and upper pelvis. Nodular infiltration of left abdominal mesentery consistent with metastatic disease, this appears increased. Numerous masses within the left greater than right colic gutters and within perihepatic ascites fluid. Small ventral hernia containing fat and soft tissue masslike density. Loculated ascites adjacent to the rectum. Musculoskeletal: No acute or suspicious osseous abnormality. IMPRESSION: 1. Slight interval increase in abdominopelvic ascites since the prior exam. 2. Redemonstrated hepatic metastatic disease and widespread peritoneal metastatic disease 3. 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 Electronically Signed   By: Donavan Foil M.D.   On: 04/24/2021 23:56   US Paracentesis  Result Date: 04/25/2021 INDICATION: 70 year old female with a history of abdominal pain and new ascites EXAM: ULTRASOUND GUIDED  PARACENTESIS MEDICATIONS: None. COMPLICATIONS: None PROCEDURE: Informed written consent was obtained from the patient after a discussion of the risks, benefits and alternatives to treatment. A timeout was performed prior to the initiation of the procedure. Initial ultrasound scanning demonstrates a small amount of ascites within the right lower abdominal quadrant. The right lower abdomen was prepped and draped in the usual sterile  fashion. 1% lidocaine was used for local anesthesia. Following this, a 8 Fr Safe-T-Centesis catheter was introduced. An ultrasound image was saved for documentation purposes. The paracentesis was performed. The catheter was removed and a dressing was applied. The patient tolerated the procedure well without immediate post procedural complication. FINDINGS: A total of approximately 1.75 L of serosanguineous fluid was removed. Samples were sent to the laboratory as requested by the clinical team. IMPRESSION: Status post ultrasound-guided paracentesis. Signed, Dulcy Fanny. Dellia Nims, RPVI [facility] Radiology Electronically Signed   By: Corrie Mckusick D.O.   On: 04/25/2021 12:28        Scheduled Meds: . dexamethasone  4 mg Oral Q8H  . heparin  5,000 Units Subcutaneous Q8H  . insulin aspart  0-15 Units Subcutaneous TID WC  . insulin aspart  0-5 Units Subcutaneous QHS  . pneumococcal 23 valent vaccine  0.5 mL Intramuscular Tomorrow-1000   Continuous Infusions: . dextrose 5 % and 0.9% NaCl 100 mL/hr at 04/26/21 0943     LOS: 1 day    Time spent: 25 minutes    Cassiel Fernandez B Priscella Mann,  MD [facility] Pager [phone]  If 7PM-7AM, please contact night-coverage [email or web address] Password [email or web address] 04/26/2021, 10:22 AM

## 2021-04-26 NOTE — Progress Notes (Signed)
Pt seen. Resting in room Paracentesis performed yesterday, pt reports feeling some better since procedure. Less pressure/discomfort in abd.  Cytology pending. Imaging and case reviewed with Dr. Kathlene Cote. Prefer to wait on cytology from ascites. If diagnostic, may not need bx. If non-diagnostic, then would proceed with image guided bx of peritoneal/mesenteric mass.  Discussed with pt who is agreeable to plan. Have ordered diet for pt today.  Ascencion Dike PA-C Interventional Radiology 04/26/2021 9:31 AM

## 2021-04-26 NOTE — Progress Notes (Signed)
[place]  Telephone:([phone] [phone]   Name: [name] Date: 04/26/2021 MRN: [id number]  DOB: [birth year]  Patient Care Team: Kirk Ruths, MD as PCP - General (Internal Medicine) Yolanda Junker, RN as Registered Nurse Theodore Demark, RN as Registered Nurse Nori Riis PA-C as Physician Assistant (Urology) Yolanda Jacks, RN as Oncology Nurse Navigator    REASON FOR CONSULTATION: CALIEGH Harrison is a 70 y.o. 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  She was admitted to the hospital on 04/25/21 with abdominal pain and was found to have ascites and A/CKD. Palliative care was consulted to help address goals.   CODE STATUS: DNR  PAST MEDICAL HISTORY: Past Medical History:  Diagnosis Date  . Adult failure to thrive   . Aortic atherosclerosis ([place])   . Asthma   . Carcinosarcoma of endometrium ([place])   . Cellulitis of left foot   . Cellulitis of right foot   . Diabetes mellitus without complication ([place])   . Glaucoma   . HLD (hyperlipidemia)   . Hypertension   . Protein calorie malnutrition ([place])   . Stage 2 skin ulcer of sacral region ([place])   . Thyroid disease    hypothyroid  . Urinary retention     PAST SURGICAL HISTORY:  Past Surgical History:  Procedure Laterality Date  . APPENDECTOMY    . 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  MEDICATIONS:  Current Facility-Administered Medications  Medication Dose Route Frequency Provider Last Rate Last Admin  . acetaminophen (TYLENOL) tablet 650 mg  650 mg Oral Q6H PRN Athena Masse, MD       Or  . acetaminophen (TYLENOL) suppository 650 mg  650 mg Rectal Q6H PRN Athena Masse, MD      . dexamethasone (DECADRON) tablet 4 mg  4 mg Oral Q8H Sreenath, Sudheer B, MD   4 mg at 04/26/21 0912  . dextrose 5 %-0.9 % sodium chloride infusion   Intravenous Continuous Mansy, Jan A, MD 100 mL/hr at 04/26/21 0943 New Bag at 04/26/21 0943  . heparin injection 5,000 Units  5,000 Units Subcutaneous Q8H Athena Masse, MD   5,000 Units at 04/25/21 2202  . HYDROcodone-acetaminophen (NORCO/VICODIN) 5-325 MG per tablet 1-2 tablet  1-2 tablet Oral Q4H PRN Judd Gaudier V, MD      . HYDROmorphone (DILAUDID) injection 0.5-1 mg  0.5-1 mg Intravenous Q3H PRN Ralene Muskrat B, MD   1 mg at 04/25/21 1711  . insulin aspart (novoLOG) injection 0-15 Units  0-15 Units Subcutaneous TID WC Athena Masse, MD   2 Units at 04/25/21 1331  . insulin aspart (novoLOG) injection 0-5 Units  0-5 Units Subcutaneous QHS Judd Gaudier V, MD      . ondansetron [facility]) tablet 4 mg  4 mg Oral Q6H PRN Athena Masse, MD  Or  . ondansetron (ZOFRAN) injection 4 mg  4 mg Intravenous Q6H PRN Athena Masse, MD      . pneumococcal 23 valent vaccine (PNEUMOVAX-23) injection 0.5 mL  0.5 mL Intramuscular Tomorrow-1000 Sreenath, Sudheer B, MD        VITAL SIGNS: BP 121/61 (BP Location: Left Arm)   Pulse (!) 103   Temp 98.3 F (36.8 C) (Oral)   Resp 17   Ht 5\' 2"  (1.575 m)   Wt 207 lb (93.9 kg)   LMP  (LMP Unknown)   SpO2 94%   BMI 37.86 kg/m  Filed Weights   04/24/21 1846  Weight: 207 lb (93.9 kg)    Estimated body mass index is 37.86 kg/m as calculated from the following:   Height as of this encounter: 5\' 2"   (1.575 m).   Weight as of this encounter: 207 lb (93.9 kg).  LABS: CBC:    Component Value Date/Time   WBC 13.0 (H) 04/26/2021 0519   HGB 9.8 (L) 04/26/2021 0519   HGB 12.7 03/06/2013 1122   HCT 30.6 (L) 04/26/2021 0519   HCT 39.1 03/06/2013 1122   PLT 290 04/26/2021 0519   PLT 267 03/06/2013 1122   MCV 87.9 04/26/2021 0519   MCV 86 03/06/2013 1122   NEUTROABS 10.9 (H) 04/26/2021 0519   NEUTROABS 4.5 03/06/2013 1122   LYMPHSABS 0.8 04/26/2021 0519   LYMPHSABS 1.6 03/06/2013 1122   MONOABS 1.1 (H) 04/26/2021 0519   MONOABS 0.6 03/06/2013 1122   EOSABS 0.0 04/26/2021 0519   EOSABS 0.2 03/06/2013 1122   BASOSABS 0.0 04/26/2021 0519   BASOSABS 0.0 03/06/2013 1122   Comprehensive Metabolic Panel:    Component Value Date/Time   NA 140 04/26/2021 0519   NA 140 08/10/2019 1357   NA 136 03/06/2013 1122   K 5.2 (H) 04/26/2021 0519   K 4.3 03/06/2013 1122   CL 112 (H) 04/26/2021 0519   CL 106 03/06/2013 1122   CO2 20 (L) 04/26/2021 0519   CO2 26 03/06/2013 1122   BUN 56 (H) 04/26/2021 0519   BUN 27 08/10/2019 1357   BUN 17 03/06/2013 1122   CREATININE 2.19 (H) 04/26/2021 0519   CREATININE 1.00 03/06/2013 1122   GLUCOSE 137 (H) 04/26/2021 0519   GLUCOSE 243 (H) 03/06/2013 1122   CALCIUM 8.9 04/26/2021 0519   CALCIUM 9.1 03/06/2013 1122   AST 42 (H) 04/24/2021 1943   AST 15 03/06/2013 1122   ALT 14 04/24/2021 1943   ALT 29 03/06/2013 1122   ALKPHOS 344 (H) 04/24/2021 1943   ALKPHOS 147 (H) 03/06/2013 1122   BILITOT 1.1 04/24/2021 1943   BILITOT 0.3 03/06/2013 1122   PROT 8.2 (H) 04/24/2021 1943   PROT 8.5 (H) 03/06/2013 1122   ALBUMIN 2.9 (L) 04/24/2021 1943   ALBUMIN 3.2 (L) 03/06/2013 1122    RADIOGRAPHIC STUDIES: CT ABDOMEN PELVIS WO CONTRAST  Result Date: 04/24/2021 CLINICAL DATA:  Left-sided abdominal pain EXAM: CT ABDOMEN AND PELVIS WITHOUT CONTRAST TECHNIQUE: Multidetector CT imaging of the abdomen and pelvis was performed following the standard protocol  without IV contrast. COMPARISON:  CT 04/05/2021 FINDINGS: Lower chest: Lung bases demonstrate no acute consolidation or pleural effusion. Scattered pulmonary nodules measuring up to 5 mm in size are redemonstrated. Incompletely visualized hilar lymph nodes some of which are calcified. Hepatobiliary: Numerous low-attenuation liver masses consistent with metastatic disease. Slight increased gallbladder density which may be due to sludge or small stones. No biliary dilatation Pancreas: Unremarkable. No pancreatic ductal  dilatation or surrounding inflammatory changes. Spleen: Normal in size without focal abnormality. Adrenals/Urinary Tract: Adrenal glands are within normal limits. 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 Small stone in the dilated renal collecting system on the left. Urinary bladder is unremarkable. Stomach/Bowel: Stomach nonenlarged. No dilated small bowel. No acute bowel wall thickening. Vascular/Lymphatic: Moderate aortic atherosclerosis. No aneurysm. Right cardio phrenic adenopathy measuring up to 12 mm, previously 10 mm. Reproductive: Status post hysterectomy. Other: No free air. Increased ascites within the abdomen and pelvis. Widespread peritoneal metastatic disease with large soft tissue mesenteric mass in the lower abdomen and upper pelvis. Nodular infiltration of left abdominal mesentery consistent with metastatic disease, this appears increased. Numerous masses within the left greater than right colic gutters and within perihepatic ascites fluid. Small ventral hernia containing fat and soft tissue masslike density. Loculated ascites adjacent to the rectum. Musculoskeletal: No acute or suspicious osseous abnormality. IMPRESSION: 1. Slight interval increase in abdominopelvic ascites since the prior exam. 2. Redemonstrated hepatic metastatic disease and widespread peritoneal metastatic disease 3. Horseshoe kidney with worsened hydronephrosis of the left aspect of the  kidney. Electronically Signed   By: Donavan Foil M.D.   On: 04/24/2021 23:56   CT CHEST WO CONTRAST  Result Date: 04/06/2021 CLINICAL DATA:  Cancer of unknown primary, multiple hepatic metastases EXAM: CT CHEST WITHOUT CONTRAST TECHNIQUE: Multidetector CT imaging of the chest was performed following the standard protocol without IV contrast. COMPARISON:  Same-day CT abdomen pelvis, CT chest 07/10/2020 FINDINGS: Cardiovascular: Limited assessment in the absence of contrast media. Normal heart size. No pericardial effusion. Coronary artery calcifications are present. Atherosclerotic plaque within the normal caliber aorta. Minimal plaque in the proximal great vessels which are otherwise unremarkable. No major venous abnormality is seen. Central pulmonary arteries are top-normal caliber. Mediastinum/Nodes: There are numerous partially calcified mediastinal and hilar lymph nodes including larger partially calcified paratracheal lymph node (2/55) measuring up to 18 mm short axis. A large partially calcified lymph node in the right hilum measures up to 14 mm short axis (2/56) additional hilar adenopathy is difficult fully assess in the absence of intravenous contrast media. No acute abnormality of the trachea or esophagus. Thyroid gland is unremarkable. Lungs/Pleura: There are 3 separate partially calcified nodule seen along the left major fissure measuring between 5-7 mm in size (3/52, 57, 61) which could reflect partially calcified intrapulmonary lymph nodes given this location. Additional Peri fissural nodule measuring 4 mm seen in the right lower lobe (3/77) some bandlike regions of scarring are seen in the medial left upper lobe with some slight architectural distortion bronchiectatic change as well as a flattened, part calcified nodule measuring up to 6 mm (3/83). Few Peri vascular nodules are noted in the right lower lobe as well (3/83, 93) measuring up to 3 mm in size. No consolidative process. No pneumothorax  or visible effusion. Upper Abdomen: Multiple hepatic lesions and cirrhotic configuration of the liver are similar to prior. Small amount of ascites. Redemonstrated hydronephrosis of a left renal moiety. Musculoskeletal: The osseous structures appear diffusely demineralized which may limit detection of small or nondisplaced fractures. Multilevel degenerative changes are present in the imaged portions of the spine. No acute osseous abnormality or suspicious osseous lesion. No worrisome or suspicious lytic or blastic lesions are identified. No worrisome chest wall masses or lesions. Mild body wall edema. IMPRESSION: 1. Multiple partially calcified mediastinal and hilar lymph nodes, as well as multiple solid and partially calcified nodules throughout both lungs predominantly in a perilymphatic  distribution. Given the interval stability from comparison CT in 2021, stable sequela of prior granulomatous disease is favored though certainly given the extent of this finding and the appearance of the abdomen, smaller metastatic adenopathy could be obscured particularly on this noncontrast CT. 2. Redemonstration of the cirrhotic configuration liver with numerous metastases and upper abdominal ascites. 3. Hydronephrosis of the left renal moiety. 4. Aortic Atherosclerosis (ICD10-I70.0). These results were called by telephone at the time of interpretation on 04/06/2021 at 12:39 am to provider [facility] , who verbally acknowledged these results. Electronically Signed   By: Lovena Le M.D.   On: 04/06/2021 00:42   CT Abdomen Pelvis W Contrast  Result Date: 04/05/2021 CLINICAL DATA:  Abdominal pain. EXAM: CT ABDOMEN AND PELVIS WITH CONTRAST TECHNIQUE: Multidetector CT imaging of the abdomen and pelvis was performed using the standard protocol following bolus administration of intravenous contrast. CONTRAST:  65mL OMNIPAQUE IOHEXOL 300 MG/ML  SOLN COMPARISON:  June 29, 2020 FINDINGS: Lower chest: Mild atelectasis is seen  within the bilateral lung bases. Hepatobiliary: The liver is cirrhotic in appearance. Innumerable heterogeneous low-attenuation liver lesions are seen. This represents a new finding when compared to the prior exam. No gallstones, gallbladder wall thickening, or biliary dilatation. Pancreas: Unremarkable. No pancreatic ductal dilatation or surrounding inflammatory changes. Spleen: Normal in size without focal abnormality. Adrenals/Urinary Tract: Adrenal glands are unremarkable. 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 Stable marked severity left-sided hydronephrosis is noted. Bladder is unremarkable. Stomach/Bowel: There is a small hiatal hernia. The appendix is surgically absent. No evidence of bowel dilatation. Noninflamed diverticula are seen throughout the large bowel. Vascular/Lymphatic: Aortic atherosclerosis. No enlarged abdominal or pelvic lymph nodes. Reproductive: The uterus is not clearly identified. Other: A 2.3 cm x 1.6 cm umbilical hernia is seen. This contains fat and a small amount of soft tissue attenuation. An extensive amount of soft tissue attenuation is seen throughout the mesentery of the anterolateral aspect of the left lower quadrant and left hemipelvis. This represents a new finding when compared to the prior study. There is a mild amount of abdominal and pelvic free fluid. Musculoskeletal: Multilevel degenerative changes seen throughout the lumbar spine. IMPRESSION: 1. Cirrhotic liver with additional findings consistent with diffuse hepatic metastasis. 2. Extensive peritoneal metastasis within the left lower quadrant and left hemipelvis. 3. 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 4. Small hiatal hernia. 5. Colonic diverticulosis. 6. Aortic atherosclerosis. Aortic Atherosclerosis (ICD10-I70.0). Electronically Signed   By: Virgina Norfolk M.D.   On: 04/05/2021 22:29   US Paracentesis  Result Date: 04/25/2021 INDICATION: 70 year old female with a  history of abdominal pain and new ascites EXAM: ULTRASOUND GUIDED  PARACENTESIS MEDICATIONS: None. COMPLICATIONS: None PROCEDURE: Informed written consent was obtained from the patient after a discussion of the risks, benefits and alternatives to treatment. A timeout was performed prior to the initiation of the procedure. Initial ultrasound scanning demonstrates a small amount of ascites within the right lower abdominal quadrant. The right lower abdomen was prepped and draped in the usual sterile fashion. 1% lidocaine was used for local anesthesia. Following this, a 8 Fr Safe-T-Centesis catheter was introduced. An ultrasound image was saved for documentation purposes. The paracentesis was performed. The catheter was removed and a dressing was applied. The patient tolerated the procedure well without immediate post procedural complication. FINDINGS: A total of approximately 1.75 L of serosanguineous fluid was removed. Samples were sent to the laboratory as requested by the clinical team. IMPRESSION: Status post ultrasound-guided paracentesis. Signed, York Cerise  Pasty Arch, DO, RPVI [facility] Electronically Signed   By: Corrie Mckusick D.O.   On: 04/25/2021 12:28    PERFORMANCE STATUS (ECOG) : 3 - Symptomatic, >50% confined to bed  Review of Systems Unless otherwise noted, a complete review of systems is negative.  Physical Exam General: NAD Pulmonary: Unlabored Extremities: no edema, no joint deformities Skin: no rashes Neurological: Weakness but otherwise nonfocal  IMPRESSION: Follow-up visit.  Patient appears clinically unchanged overnight.  Serum creatinine and potassium improving.  Cytology pending from paracentesis.  Symptomatically, she feels somewhat better after paracentesis yesterday.  She reports pain is "so-so".  She had some grimacing while I was in the room but denied wanting pain medication.  I again attempted to call her niece  without success.  Voicemail left.  PLAN: -Continue current scope of treatment -Continue as needed analgesics -Daily bowel regimen -Will follow   Time Total: 15 minutes  Visit consisted of counseling and education dealing with the complex and emotionally intense issues of symptom management and palliative care in the setting of serious and potentially life-threatening illness.Greater than 50%  of this time was spent counseling and coordinating care related to the above assessment and plan.  Signed by: Altha Harm, PhD, NP-C

## 2021-04-27 DIAGNOSIS — Z515 Encounter for palliative care: Secondary | ICD-10-CM | POA: Diagnosis not present

## 2021-04-27 DIAGNOSIS — E875 Hyperkalemia: Secondary | ICD-10-CM | POA: Diagnosis not present

## 2021-04-27 LAB — GLUCOSE, CAPILLARY
Glucose-Capillary: 168 mg/dL — ABNORMAL HIGH (ref 70–99)
Glucose-Capillary: 187 mg/dL — ABNORMAL HIGH (ref 70–99)
Glucose-Capillary: 191 mg/dL — ABNORMAL HIGH (ref 70–99)
Glucose-Capillary: 198 mg/dL — ABNORMAL HIGH (ref 70–99)

## 2021-04-27 LAB — RENAL FUNCTION PANEL
Albumin: 2.3 g/dL — ABNORMAL LOW (ref 3.5–5.0)
Anion gap: 10 (ref 5–15)
BUN: 49 mg/dL — ABNORMAL HIGH (ref 8–23)
CO2: 18 mmol/L — ABNORMAL LOW (ref 22–32)
Calcium: 9.2 mg/dL (ref 8.9–10.3)
Chloride: 111 mmol/L (ref 98–111)
Creatinine, Ser: 2.18 mg/dL — ABNORMAL HIGH (ref 0.44–1.00)
GFR, Estimated: 24 mL/min — ABNORMAL LOW (ref >60)
Glucose, Bld: 193 mg/dL — ABNORMAL HIGH (ref 70–99)
Phosphorus: 3.4 mg/dL (ref 2.5–4.6)
Potassium: 5.7 mmol/L — ABNORMAL HIGH (ref 3.5–5.1)
Sodium: 139 mmol/L (ref 135–145)

## 2021-04-27 LAB — CBC
HCT: 32.1 % — ABNORMAL LOW (ref 36.0–46.0)
Hemoglobin: 10.3 g/dL — ABNORMAL LOW (ref 12.0–15.0)
MCH: 27.8 pg (ref 26.0–34.0)
MCHC: 32.1 g/dL (ref 30.0–36.0)
MCV: 86.8 fL (ref 80.0–100.0)
Platelets: 325 10*3/uL (ref 150–400)
RBC: 3.7 MIL/uL — ABNORMAL LOW (ref 3.87–5.11)
RDW: 17.3 % — ABNORMAL HIGH (ref 11.5–15.5)
WBC: 17.6 10*3/uL — ABNORMAL HIGH (ref 4.0–10.5)
nRBC: 0.1 % (ref 0.0–0.2)

## 2021-04-27 LAB — CYTOLOGY - NON PAP

## 2021-04-27 MED ORDER — FENTANYL 12 MCG/HR TD PT72
1.0000 | MEDICATED_PATCH | TRANSDERMAL | Status: DC
  Administered 2021-04-27: 1 via TRANSDERMAL
  Filled 2021-04-27: qty 1

## 2021-04-27 MED ORDER — SODIUM ZIRCONIUM CYCLOSILICATE 10 G PO PACK
10.0000 g | PACK | Freq: Every day | ORAL | Status: AC
  Administered 2021-04-27: 10 g via ORAL
  Filled 2021-04-27: qty 1

## 2021-04-27 MED ORDER — SODIUM CHLORIDE 0.9 % IV SOLN: INTRAVENOUS | Status: AC

## 2021-04-27 NOTE — Progress Notes (Addendum)
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 intake.  Patient's pain was addressed with use of intravenous and oral narcotics with short-lived results.  Hyperkalemia is persistent despite Lokelma and aggressive IV fluids.  Will redose Lokelma 10 g x 1 continue IV fluids.  Recheck creatinine in the morning.  Nephrology consult requested.  Also discussed the case with oncology and palliative care.  Patient was seen by her known oncologist Dr. Grayland Ormond as well as Altha Harm from palliative care service.  She has a DNR status after her discussions with Josh.  Will attempt to optimize pain regimen with use of oral Decadron for anti-inflammatory effect and IV hydromorphone.  5/5: Patient was seen in consultation by oncology, palliative care.  She was scheduled for liver biopsy today however after interventional radiology evaluation the patient was determined to be too high risk as she is reaccumulating ascites.  Liver biopsy on hold for now.  Attempting to optimize pain regimen.  Discussed with nephrology, imaging demonstrates worsening of known left-sided hydronephrosis.  Will discuss case with urology 5/6: Case discussed with urology.  No intervention from their standpoint.  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  Images reviewed and anatomic appearance is stable.  We will set up with outpatient Plan: Creatinine has improved to 2.18.  Unclear whether further IV fluids is really of any benefit at this point.  Will give normal saline for next 10 hours and reassess creatinine in a.m.  Acute abdominal pain associated with vomiting Poor p.o. intake endometrial carcinoma with hepatic and peritoneal metastatic deposits Patient status post paracentesis on 5/4, 1.75 L serosanguineous fluid obtained Leukocytosis improving Biopsy scheduled 5/5 cannot be performed due to reaccumulating ascites Plan: Continue multimodal pain control IV antiemetics as needed IV fluids Plan of care on consult.  Recommendations appreciated.  Message sent to [place] to determine appropriate disposition plan  Type 2 diabetes mellitus Patient on oral antihyperglycemic's at home Plan: Hold oral medications Sliding scale coverage Carb modified diet  Essential hypertension Home blood pressure agents on hold  DVT prophylaxis: SQ heparin Code Status: DNR Family Communication: Relative Zanib Duane [phone] on 5/5 Disposition Plan: Status is: Inpatient  Remains inpatient appropriate because:Inpatient level of care appropriate due to severity of illness   Dispo: The patient is from: Home              Anticipated d/c is to: Home              Patient currently is not medically stable to d/c.   Difficult to place patient No  Remains inpatient.  Pain uncontrolled.  Attempt to optimize pain regimen prior to disposition.  Level of care: Med-Surg  Consultants:   Oncology  Nephrology  Palliative care  Procedures:   Paracentesis, 5/4  Antimicrobials:    None   Subjective: Patient seen and examined.  Lethargic.  Opens eyes on command.  Follows some commands.  Continues to wince in abdominal pain.  Objective: Vitals:   04/26/21 2143 04/27/21 0042 04/27/21 0510 04/27/21 0749  BP: (!) 145/76 (!) 143/78 (!) 148/91 (!) 154/89  Pulse: (!) 104 (!) 103 (!) 109 (!) 105  Resp:  18 20 15   Temp:  97.9 F (36.6 C) 97.8 F (36.6 C) 98 F (36.7 C)  TempSrc:  Oral    SpO2: 98% 99% 97% 96%  Weight:      Height:        Intake/Output Summary (Last 24 hours) at 04/27/2021 1057 Last data filed at 04/27/2021 1031 Gross per 24 hour  Intake 1261.03 ml  Output 800 ml  Net 461.03 ml   Filed Weights   04/24/21 1846  Weight: 93.9 kg    Examination:  General exam: Mild distress due to pain Respiratory system: Clear to auscultation. Respiratory effort normal. Cardiovascular system: S1 & S2 heard, RRR. No JVD, murmurs, rubs, gallops or clicks. No pedal edema. Gastrointestinal system: Nondistended, tender to palpation, normal bowel sounds  Central nervous system: Alert and oriented. No focal neurological deficits. Extremities: Symmetric 5 x 5 power. Skin: No rashes, lesions or ulcers Psychiatry: Judgement and insight appear normal. Mood & affect appropriate.     Data Reviewed: I have personally reviewed following labs and imaging studies  CBC: Recent Labs  Lab 04/24/21 1943 04/25/21 0330 04/26/21 0519 04/27/21 0907  WBC 13.5* 13.5* 13.0* 17.6*  NEUTROABS 11.5*  --  10.9*  --   HGB 10.5* 10.8* 9.8* 10.3*  HCT 32.4* 33.9* 30.6* 32.1*  MCV 86.9 87.6 87.9 86.8  PLT 317 321 290 546   Basic Metabolic Panel: Recent Labs  Lab 04/24/21 1943 04/25/21 0330 04/26/21 0519 04/27/21 0907  NA 135 136 140 139  K 6.2* 5.9* 5.2* 5.7*  CL 102 106 112* 111  CO2 21* 20* 20* 18*  GLUCOSE 149* 141* 137* 193*  BUN 71* 66* 56* 49*  CREATININE 3.04* 3.00* 2.19* 2.18*  CALCIUM 9.4 9.2 8.9 9.2  PHOS  --   --   --  3.4   GFR: Estimated Creatinine  Clearance: 26 mL/min (A) (by C-G formula based on SCr of 2.18 mg/dL (H)). Liver Function Tests: Recent Labs  Lab 04/24/21 1943 04/27/21 0907  AST 42*  --   ALT 14  --   ALKPHOS 344*  --   BILITOT 1.1  --   PROT 8.2*  --   ALBUMIN 2.9* 2.3*   Recent Labs  Lab 04/24/21 1943  LIPASE 49   No results for input(s): AMMONIA in the last 168 hours. Coagulation Profile: Recent Labs  Lab 04/26/21 0519  INR 1.1   Cardiac Enzymes: No results for input(s): CKTOTAL, CKMB, CKMBINDEX, TROPONINI in the last 168 hours. BNP (last 3 results) No results for input(s): PROBNP in the last 8760 hours. HbA1C: Recent Labs    04/25/21 0330  HGBA1C 7.6*   CBG: Recent Labs  Lab 04/26/21 0804 04/26/21 1119 04/26/21 1810 04/26/21 2120 04/27/21 0750  GLUCAP 149* 197* 161* 175* 168*   Lipid Profile: No results for input(s): CHOL, HDL, LDLCALC, TRIG, CHOLHDL, LDLDIRECT in the last 72 hours. Thyroid Function Tests: No results for input(s): TSH, T4TOTAL, FREET4, T3FREE, THYROIDAB in the last 72 hours.  Anemia Panel: No results for input(s): VITAMINB12, FOLATE, FERRITIN, TIBC, IRON, RETICCTPCT in the last 72 hours. Sepsis Labs: Recent Labs  Lab 04/24/21 1943  LATICACIDVEN 1.7    Recent Results (from the past 240 hour(s))  Resp Panel by RT-PCR (Flu A&B, Covid) Nasopharyngeal Swab     Status: None   Collection Time: 04/25/21 12:45 AM   Specimen: Nasopharyngeal Swab; Nasopharyngeal(NP) swabs in vial transport medium  Result Value Ref Range Status   SARS Coronavirus 2 by RT PCR NEGATIVE NEGATIVE Final    Comment: (NOTE) SARS-CoV-2 target nucleic acids are NOT DETECTED.  The SARS-CoV-2 RNA is generally detectable in upper respiratory specimens during the acute phase of infection. The lowest concentration of SARS-CoV-2 viral copies this assay can detect is 138 copies/mL. A negative result does not preclude SARS-Cov-2 infection and should not be used as the sole basis for treatment or other  patient management decisions. A negative result may occur with  improper specimen collection/handling, submission of specimen other than nasopharyngeal swab, presence of viral mutation(s) within the areas targeted by this assay, and inadequate number of viral copies(<138 copies/mL). A negative result must be combined with clinical observations, patient history, and epidemiological information. The expected result is Negative.  Fact Sheet for Patients:  EntrepreneurPulse.com.au  Fact Sheet for Healthcare Providers:  IncredibleEmployment.be  This test is no t yet approved or cleared by the [place] FDA and  has been authorized for detection and/or diagnosis of SARS-CoV-2 by FDA under an Emergency Use Authorization (EUA). This EUA will remain  in effect (meaning this test can be used) for the duration of the COVID-19 declaration under Section 564(b)(1) of the Act, 21 U.S.C.section 360bbb-3(b)(1), unless the authorization is terminated  or revoked sooner.       Influenza A by PCR NEGATIVE NEGATIVE Final   Influenza B by PCR NEGATIVE NEGATIVE Final    Comment: (NOTE) The Xpert Xpress SARS-CoV-2/FLU/RSV plus assay is intended as an aid in the diagnosis of influenza from Nasopharyngeal swab specimens and should not be used as a sole basis for treatment. Nasal washings and aspirates are unacceptable for Xpert Xpress SARS-CoV-2/FLU/RSV testing.  Fact Sheet for Patients: EntrepreneurPulse.com.au  Fact Sheet for Healthcare Providers: IncredibleEmployment.be  This test is not yet approved or cleared by the [place] FDA and has been authorized for detection and/or diagnosis of SARS-CoV-2 by FDA under an Emergency Use Authorization (EUA). This EUA will remain in effect (meaning this test can be used) for the duration of the COVID-19 declaration under Section 564(b)(1) of the Act, 21 U.S.C. section  360bbb-3(b)(1), unless the authorization is terminated or revoked.  Performed at [facility], [address] [place], [place] [address]   Body fluid culture w Gram Stain     Status: None (Preliminary result)   Collection Time: 04/25/21 12:10 PM   Specimen: PATH Cytology Peritoneal fluid  Result Value Ref Range Status   Specimen Description   Final    PERITONEAL Performed at [facility], [address], [place], [place] [address]    Special Requests   Final    NONE Performed at [facility], [address] [place], [place] [address]    Gram Stain   Final    MODERATE WBC PRESENT,BOTH PMN AND MONONUCLEAR NO ORGANISMS SEEN    Culture   Final    NO GROWTH 2 DAYS Performed at [facility], [place] [address] [place], [place] [address]    Report Status PENDING  Incomplete  MRSA PCR Screening     Status: None   Collection Time: 04/25/21  3:58 PM   Specimen: Nasopharyngeal  Result Value Ref Range Status   MRSA by PCR NEGATIVE NEGATIVE Final    Comment:        The GeneXpert MRSA Assay (FDA approved for NASAL specimens only), is one component of a comprehensive MRSA colonization surveillance program. It is not intended to diagnose MRSA infection nor to guide or monitor treatment for MRSA infections. Performed at [facility], [address], [place], [place] [address]          Radiology Studies: US Paracentesis  Result Date: 04/25/2021 INDICATION: 70 year old female with a history of abdominal pain and new ascites EXAM: ULTRASOUND GUIDED  PARACENTESIS MEDICATIONS: None. COMPLICATIONS: None PROCEDURE: Informed written consent was obtained from the patient after a discussion of the risks, benefits and alternatives to treatment. A timeout was performed prior to the initiation of the procedure. Initial ultrasound scanning demonstrates a small amount of ascites within the right lower abdominal quadrant. The right lower abdomen was  prepped and draped in the usual sterile fashion. 1% lidocaine was used for local anesthesia. Following this, a 8 Fr Safe-T-Centesis catheter was introduced. An ultrasound image was saved for documentation purposes. The paracentesis was performed. The catheter was removed and a dressing was applied. The patient tolerated the procedure well without immediate post procedural complication. FINDINGS: A total of approximately 1.75 L of serosanguineous fluid was removed. Samples were sent to the laboratory as requested by the clinical team. IMPRESSION: Status post ultrasound-guided paracentesis. Signed, Dulcy Fanny. Dellia Nims, RPVI [facility] Radiology Electronically Signed   By: Corrie Mckusick D.O.   On: 04/25/2021 12:28        Scheduled Meds: . dexamethasone  4 mg Oral Q8H  . feeding supplement  1 Container Oral TID BM  . heparin  5,000 Units Subcutaneous Q8H  . insulin aspart  0-15 Units Subcutaneous TID WC  . insulin aspart  0-5 Units Subcutaneous QHS  . pneumococcal 23 valent vaccine  0.5 mL Intramuscular Tomorrow-1000  . sodium zirconium cyclosilicate  10 g Oral Daily   Continuous Infusions:    LOS: 2 days    Time spent: 15 minutes    Sidney Ace, MD [facility] Pager [phone]  If 7PM-7AM, please contact night-coverage 04/27/2021, 10:57 AM

## 2021-04-27 NOTE — TOC Progression Note (Signed)
Transition of Care [facility]) - Progression Note    Patient Details  Name: [name] MRN: [id number] Date of Birth: [birth year]  Transition of Care [facility]) CM/SW Contact  Shelbie Hutching, RN Phone Number: 04/27/2021, 1:28 PM  Clinical Narrative:    RNCM met with patient again today to discuss discharge planning.  Patient is still lethargic and not wanting to talk.  Patient reports that she is not sure what her goals are right now.  Patient is not medically stable at this time for discharge.  Patient being followed by oncology and Palliative care.  Ascites is re acumulating and IR has deemed her too high risk for liver biopsy at this time.   TOC will cont to follow.    Expected Discharge Plan: Assisted Living Barriers to Discharge: Continued Medical Work up  Expected Discharge Plan and Services Expected Discharge Plan: Assisted Living   Discharge Planning Services: CM Consult   Living arrangements for the past 2 months: Assisted Living Facility                                       Social Determinants of Health (SDOH) Interventions    Readmission Risk Interventions No flowsheet data found.

## 2021-04-27 NOTE — Progress Notes (Signed)
[facility]  ROUNDING NOTE   Subjective:   Patient seen resting in bed Alert and oriented Has hiccups at this time and states that's not uncommon No other complaints at this time  Objective:  Vital signs in last 24 hours:  Temp:  [97.3 F (36.3 C)-98 F (36.7 C)] 98 F (36.7 C) (05/06 0749) Pulse Rate:  [103-110] 105 (05/06 0749) Resp:  [15-20] 15 (05/06 0749) BP: (143-154)/(76-110) 154/89 (05/06 0749) SpO2:  [96 %-99 %] 96 % (05/06 0749)  Weight change:  Filed Weights   04/24/21 1846  Weight: 93.9 kg    Intake/Output: I/O last 3 completed shifts: In: 1261 [I.V.:1261] Out: 1250 [Urine:1250]   Intake/Output this shift:  No intake/output data recorded.  Physical Exam: General: NAD, laying in bed  Head: Normocephalic, atraumatic. Moist oral mucosal membranes  Eyes: Anicteric  Lungs:  Clear to auscultation, normal breathing effort  Heart: Regular rate and rhythm  Abdomen:  Soft, tender  Extremities:  no peripheral edema.  Neurologic: Nonfocal, moving all four extremities  Skin: No lesions       Basic Metabolic Panel: Recent Labs  Lab 04/24/21 1943 04/25/21 0330 04/26/21 0519 04/27/21 0907  NA 135 136 140 139  K 6.2* 5.9* 5.2* 5.7*  CL 102 106 112* 111  CO2 21* 20* 20* 18*  GLUCOSE 149* 141* 137* 193*  BUN 71* 66* 56* 49*  CREATININE 3.04* 3.00* 2.19* 2.18*  CALCIUM 9.4 9.2 8.9 9.2  PHOS  --   --   --  3.4    Liver Function Tests: Recent Labs  Lab 04/24/21 1943 04/27/21 0907  AST 42*  --   ALT 14  --   ALKPHOS 344*  --   BILITOT 1.1  --   PROT 8.2*  --   ALBUMIN 2.9* 2.3*   Recent Labs  Lab 04/24/21 1943  LIPASE 49   No results for input(s): AMMONIA in the last 168 hours.  CBC: Recent Labs  Lab 04/24/21 1943 04/25/21 0330 04/26/21 0519 04/27/21 0907  WBC 13.5* 13.5* 13.0* 17.6*  NEUTROABS 11.5*  --  10.9*  --   HGB 10.5* 10.8* 9.8* 10.3*  HCT 32.4* 33.9* 30.6* 32.1*  MCV 86.9 87.6 87.9 86.8  PLT 317 321 290 325     Cardiac Enzymes: No results for input(s): CKTOTAL, CKMB, CKMBINDEX, TROPONINI in the last 168 hours.  BNP: Invalid input(s): POCBNP  CBG: Recent Labs  Lab 04/26/21 0804 04/26/21 1119 04/26/21 1810 04/26/21 2120 04/27/21 0750  GLUCAP 149* 197* 161* 175* 168*    Microbiology: Results for orders placed or performed during the hospital encounter of 04/24/21  Resp Panel by RT-PCR (Flu A&B, Covid) Nasopharyngeal Swab     Status: None   Collection Time: 04/25/21 12:45 AM   Specimen: Nasopharyngeal Swab; Nasopharyngeal(NP) swabs in vial transport medium  Result Value Ref Range Status   SARS Coronavirus 2 by RT PCR NEGATIVE NEGATIVE Final    Comment: (NOTE) SARS-CoV-2 target nucleic acids are NOT DETECTED.  The SARS-CoV-2 RNA is generally detectable in upper respiratory specimens during the acute phase of infection. The lowest concentration of SARS-CoV-2 viral copies this assay can detect is 138 copies/mL. A negative result does not preclude SARS-Cov-2 infection and should not be used as the sole basis for treatment or other patient management decisions. A negative result may occur with  improper specimen collection/handling, submission of specimen other than nasopharyngeal swab, presence of viral mutation(s) within the areas targeted by this assay, and inadequate number  of viral copies(<138 copies/mL). A negative result must be combined with clinical observations, patient history, and epidemiological information. The expected result is Negative.  Fact Sheet for Patients:  EntrepreneurPulse.com.au  Fact Sheet for Healthcare Providers:  IncredibleEmployment.be  This test is no t yet approved or cleared by the [place] FDA and  has been authorized for detection and/or diagnosis of SARS-CoV-2 by FDA under an Emergency Use Authorization (EUA). This EUA will remain  in effect (meaning this test can be used) for the duration of  the COVID-19 declaration under Section 564(b)(1) of the Act, 21 U.S.C.section 360bbb-3(b)(1), unless the authorization is terminated  or revoked sooner.       Influenza A by PCR NEGATIVE NEGATIVE Final   Influenza B by PCR NEGATIVE NEGATIVE Final    Comment: (NOTE) The Xpert Xpress SARS-CoV-2/FLU/RSV plus assay is intended as an aid in the diagnosis of influenza from Nasopharyngeal swab specimens and should not be used as a sole basis for treatment. Nasal washings and aspirates are unacceptable for Xpert Xpress SARS-CoV-2/FLU/RSV testing.  Fact Sheet for Patients: EntrepreneurPulse.com.au  Fact Sheet for Healthcare Providers: IncredibleEmployment.be  This test is not yet approved or cleared by the [place] FDA and has been authorized for detection and/or diagnosis of SARS-CoV-2 by FDA under an Emergency Use Authorization (EUA). This EUA will remain in effect (meaning this test can be used) for the duration of the COVID-19 declaration under Section 564(b)(1) of the Act, 21 U.S.C. section 360bbb-3(b)(1), unless the authorization is terminated or revoked.  Performed at [facility], [address] [place], [place] [address]   Body fluid culture w Gram Stain     Status: None (Preliminary result)   Collection Time: 04/25/21 12:10 PM   Specimen: PATH Cytology Peritoneal fluid  Result Value Ref Range Status   Specimen Description   Final    PERITONEAL Performed at [facility], [address], [place], [place] [address]    Special Requests   Final    NONE Performed at [facility], [address] [place], [place] [address]    Gram Stain   Final    MODERATE WBC PRESENT,BOTH PMN AND MONONUCLEAR NO ORGANISMS SEEN    Culture   Final    NO GROWTH 2 DAYS Performed at [facility], [place] [address], [place], [place] [address]    Report Status PENDING  Incomplete  MRSA PCR Screening     Status: None    Collection Time: 04/25/21  3:58 PM   Specimen: Nasopharyngeal  Result Value Ref Range Status   MRSA by PCR NEGATIVE NEGATIVE Final    Comment:        The GeneXpert MRSA Assay (FDA approved for NASAL specimens only), is one component of a comprehensive MRSA colonization surveillance program. It is not intended to diagnose MRSA infection nor to guide or monitor treatment for MRSA infections. Performed at [facility], [address] [place], [place] [address]     Coagulation Studies: Recent Labs    04/26/21 0519  LABPROT 14.5  INR 1.1    Urinalysis: Recent Labs    04/25/21 0330  COLORURINE YELLOW*  LABSPEC 1.015  PHURINE 5.0  GLUCOSEU NEGATIVE  HGBUR SMALL*  BILIRUBINUR NEGATIVE  KETONESUR 5*  PROTEINUR NEGATIVE  NITRITE NEGATIVE  LEUKOCYTESUR NEGATIVE      Imaging: No results found.   Medications:   . sodium chloride     . dexamethasone  4 mg Oral Q8H  . feeding  supplement  1 Container Oral TID BM  . heparin  5,000 Units Subcutaneous Q8H  . insulin aspart  0-15 Units Subcutaneous TID WC  . insulin aspart  0-5 Units Subcutaneous QHS  . pneumococcal 23 valent vaccine  0.5 mL Intramuscular Tomorrow-1000  . sodium zirconium cyclosilicate  10 g Oral Daily   acetaminophen **OR** acetaminophen, HYDROcodone-acetaminophen, HYDROmorphone (DILAUDID) injection, ondansetron **OR** ondansetron (ZOFRAN) IV  Assessment/ Plan:  Ms. [name] is a 70 y.o.  female who was admitted to [facility] on 04/24/2021 for acute kidney injury with hyperkalemia. 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   1. Acute Kidney Injury with hyperkalemia likely due to dehydration and possible urinary retention d/t horseshoe kidney with baseline creatinine 1.19 and GFR of 54 on 07/10/20.  Creatinine  improved Potassium improved 5.7 Lokelma ordered Continue IVF.  No immediate need for dialysis Will continue to monitor  Lab Results  Component Value Date   CREATININE 2.18 (H) 04/27/2021   CREATININE 2.19 (H) 04/26/2021   CREATININE 3.00 (H) 04/25/2021    Intake/Output Summary (Last 24 hours) at 04/27/2021 1212 Last data filed at 04/27/2021 1031 Gross per 24 hour  Intake 1261.03 ml  Output 800 ml  Net 461.03 ml   2. Diabetes mellitus type 2 noninsulin dependent Hgb A1c 7.6 on 04/25/21 Glucose will be monitored during this admission     LOS: 2 [place] 5/6/202212:12 PM

## 2021-04-27 NOTE — Progress Notes (Signed)
[name]  Telephone:([phone] [phone]   Name: [name] Date: 04/27/2021 MRN: [id number]  DOB: 05/04/1951  Patient Care Team: Kirk Ruths, MD as PCP - General (Internal Medicine) Rico Junker, RN as Registered Nurse Theodore Demark, RN as Registered Nurse Nori Riis PA-C as Physician Assistant (Urology) Clent Jacks, RN as Oncology Nurse Navigator    REASON FOR CONSULTATION: [name] is a 70 y.o. 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  She was admitted to the hospital on 04/25/21 with abdominal pain and was found to have ascites and A/CKD. Palliative care was consulted to help address goals.   CODE STATUS: DNR  PAST MEDICAL HISTORY: Past Medical History:  Diagnosis Date  . Adult failure to thrive   . Aortic atherosclerosis ([place])   . Asthma   . Carcinosarcoma of endometrium ([place])   . Cellulitis of left foot   . Cellulitis of right foot   . Diabetes mellitus without complication ([place])   . Glaucoma   . HLD (hyperlipidemia)   . Hypertension   . Protein calorie malnutrition ([place])   . Stage 2 skin ulcer of sacral region ([place])   . Thyroid disease    hypothyroid  . Urinary retention     PAST SURGICAL HISTORY:  Past Surgical History:  Procedure Laterality Date  . APPENDECTOMY    . 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  MEDICATIONS:  Current Facility-Administered Medications  Medication Dose Route Frequency Provider Last Rate Last Admin  . 0.9 %  sodium chloride infusion   Intravenous Continuous Ralene Muskrat B, MD 100 mL/hr at 04/27/21 1256 New Bag at 04/27/21 1256  . acetaminophen (TYLENOL) tablet 650 mg  650 mg Oral Q6H PRN Athena Masse, MD       Or  . acetaminophen (TYLENOL) suppository 650 mg  650 mg Rectal Q6H PRN Athena Masse, MD      . dexamethasone (DECADRON) tablet 4 mg  4 mg Oral Q8H Sreenath, Sudheer B, MD   4 mg at 04/27/21 0926  . feeding supplement (BOOST / RESOURCE BREEZE) liquid 1 Container  1 Container Oral TID BM Sidney Ace, MD   1 Container at 04/26/21 1432  . fentaNYL (DURAGESIC) 12 MCG/HR 1 patch  1 patch Transdermal Q72H Richell Corker R, NP      . heparin injection 5,000 Units  5,000 Units Subcutaneous Q8H Athena Masse, MD   5,000 Units at 04/27/21 1351  . HYDROcodone-acetaminophen (NORCO/VICODIN) 5-325 MG per tablet 1-2 tablet  1-2 tablet Oral Q4H PRN Athena Masse, MD   1 tablet at 04/27/21 1351  . HYDROmorphone (DILAUDID) injection 0.5-1 mg  0.5-1 mg Intravenous Q3H PRN Ralene Muskrat B, MD   1 mg at 04/26/21 1429  . insulin aspart (novoLOG) injection 0-15 Units  0-15 Units Subcutaneous TID  WC Athena Masse, MD   3 Units at 04/27/21 1351  . insulin aspart (novoLOG) injection 0-5 Units  0-5 Units Subcutaneous QHS Athena Masse, MD      . ondansetron [facility]) tablet 4 mg  4 mg Oral Q6H PRN Athena Masse, MD       Or  . ondansetron [facility]) injection 4 mg  4 mg Intravenous Q6H PRN Athena Masse, MD      . pneumococcal 23 valent vaccine (PNEUMOVAX-23) injection 0.5 mL  0.5 mL Intramuscular Tomorrow-1000 Sreenath, Sudheer B, MD        VITAL SIGNS: BP 100/81 (BP Location: Right Arm)   Pulse 100   Temp 97.6 F (36.4 C)   Resp 20   Ht _0  (1.575 m)   Wt  207 lb (93.9 kg)   LMP  (LMP Unknown)   SpO2 100%   BMI 37.86 kg/m  Filed Weights   04/24/21 1846  Weight: 207 lb (93.9 kg)    Estimated body mass index is 37.86 kg/m as calculated from the following:   Height as of this encounter: _1  (1.575 m).   Weight as of this encounter: 207 lb (93.9 kg).  LABS: CBC:    Component Value Date/Time   WBC 17.6 (H) 04/27/2021 0907   HGB 10.3 (L) 04/27/2021 0907   HGB 12.7 03/06/2013 1122   HCT 32.1 (L) 04/27/2021 0907   HCT 39.1 03/06/2013 1122   PLT 325 04/27/2021 0907   PLT 267 03/06/2013 1122   MCV 86.8 04/27/2021 0907   MCV 86 03/06/2013 1122   NEUTROABS 10.9 (H) 04/26/2021 0519   NEUTROABS 4.5 03/06/2013 1122   LYMPHSABS 0.8 04/26/2021 0519   LYMPHSABS 1.6 03/06/2013 1122   MONOABS 1.1 (H) 04/26/2021 0519   MONOABS 0.6 03/06/2013 1122   EOSABS 0.0 04/26/2021 0519   EOSABS 0.2 03/06/2013 1122   BASOSABS 0.0 04/26/2021 0519   BASOSABS 0.0 03/06/2013 1122   Comprehensive Metabolic Panel:    Component Value Date/Time   NA 139 04/27/2021 0907   NA 140 08/10/2019 1357   NA 136 03/06/2013 1122   K 5.7 (H) 04/27/2021 0907   K 4.3 03/06/2013 1122   CL 111 04/27/2021 0907   CL 106 03/06/2013 1122   CO2 18 (L) 04/27/2021 0907   CO2 26 03/06/2013 1122   BUN 49 (H) 04/27/2021 0907   BUN 27 08/10/2019 1357   BUN 17 03/06/2013 1122   CREATININE 2.18 (H) 04/27/2021 0907   CREATININE 1.00 03/06/2013 1122   GLUCOSE 193 (H) 04/27/2021 0907   GLUCOSE 243 (H) 03/06/2013 1122   CALCIUM 9.2 04/27/2021 0907   CALCIUM 9.1 03/06/2013 1122   AST 42 (H) 04/24/2021 1943   AST 15 03/06/2013 1122   ALT 14 04/24/2021 1943   ALT 29 03/06/2013 1122   ALKPHOS 344 (H) 04/24/2021 1943   ALKPHOS 147 (H) 03/06/2013 1122   BILITOT 1.1 04/24/2021 1943   BILITOT 0.3 03/06/2013 1122   PROT 8.2 (H) 04/24/2021 1943   PROT 8.5 (H) 03/06/2013 1122   ALBUMIN 2.3 (L) 04/27/2021 0907   ALBUMIN 3.2 (L) 03/06/2013 1122    RADIOGRAPHIC STUDIES: CT  ABDOMEN PELVIS WO CONTRAST  Result Date: 04/24/2021 CLINICAL DATA:  Left-sided abdominal pain EXAM: CT ABDOMEN AND PELVIS WITHOUT CONTRAST TECHNIQUE: Multidetector CT imaging of the abdomen and pelvis was performed following the standard protocol without IV contrast. COMPARISON:  CT 04/05/2021 FINDINGS: Lower chest: Lung bases demonstrate no acute consolidation or pleural  effusion. Scattered pulmonary nodules measuring up to 5 mm in size are redemonstrated. Incompletely visualized hilar lymph nodes some of which are calcified. Hepatobiliary: Numerous low-attenuation liver masses consistent with metastatic disease. Slight increased gallbladder density which may be due to sludge or small stones. No biliary dilatation Pancreas: Unremarkable. No pancreatic ductal dilatation or surrounding inflammatory changes. Spleen: Normal in size without focal abnormality. Adrenals/Urinary Tract: Adrenal glands are within normal limits. 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 Small stone in the dilated renal collecting system on the left. Urinary bladder is unremarkable. Stomach/Bowel: Stomach nonenlarged. No dilated small bowel. No acute bowel wall thickening. Vascular/Lymphatic: Moderate aortic atherosclerosis. No aneurysm. Right cardio phrenic adenopathy measuring up to 12 mm, previously 10 mm. Reproductive: Status post hysterectomy. Other: No free air. Increased ascites within the abdomen and pelvis. Widespread peritoneal metastatic disease with large soft tissue mesenteric mass in the lower abdomen and upper pelvis. Nodular infiltration of left abdominal mesentery consistent with metastatic disease, this appears increased. Numerous masses within the left greater than right colic gutters and within perihepatic ascites fluid. Small ventral hernia containing fat and soft tissue masslike density. Loculated ascites adjacent to the rectum. Musculoskeletal: No acute or suspicious osseous abnormality.  IMPRESSION: 1. Slight interval increase in abdominopelvic ascites since the prior exam. 2. Redemonstrated hepatic metastatic disease and widespread peritoneal metastatic disease 3. 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 Electronically Signed   By: Donavan Foil M.D.   On: 04/24/2021 23:56   CT CHEST WO CONTRAST  Result Date: 04/06/2021 CLINICAL DATA:  Cancer of unknown primary, multiple hepatic metastases EXAM: CT CHEST WITHOUT CONTRAST TECHNIQUE: Multidetector CT imaging of the chest was performed following the standard protocol without IV contrast. COMPARISON:  Same-day CT abdomen pelvis, CT chest 07/10/2020 FINDINGS: Cardiovascular: Limited assessment in the absence of contrast media. Normal heart size. No pericardial effusion. Coronary artery calcifications are present. Atherosclerotic plaque within the normal caliber aorta. Minimal plaque in the proximal great vessels which are otherwise unremarkable. No major venous abnormality is seen. Central pulmonary arteries are top-normal caliber. Mediastinum/Nodes: There are numerous partially calcified mediastinal and hilar lymph nodes including larger partially calcified paratracheal lymph node (2/55) measuring up to 18 mm short axis. A large partially calcified lymph node in the right hilum measures up to 14 mm short axis (2/56) additional hilar adenopathy is difficult fully assess in the absence of intravenous contrast media. No acute abnormality of the trachea or esophagus. Thyroid gland is unremarkable. Lungs/Pleura: There are 3 separate partially calcified nodule seen along the left major fissure measuring between 5-7 mm in size (3/52, 57, 61) which could reflect partially calcified intrapulmonary lymph nodes given this location. Additional Peri fissural nodule measuring 4 mm seen in the right lower lobe (3/77) some bandlike regions of scarring are seen in the medial left upper lobe with some slight architectural  distortion bronchiectatic change as well as a flattened, part calcified nodule measuring up to 6 mm (3/83). Few Peri vascular nodules are noted in the right lower lobe as well (3/83, 93) measuring up to 3 mm in size. No consolidative process. No pneumothorax or visible effusion. Upper Abdomen: Multiple hepatic lesions and cirrhotic configuration of the liver are similar to prior. Small amount of ascites. Redemonstrated hydronephrosis of a left renal moiety. Musculoskeletal: The osseous structures appear diffusely demineralized which may limit detection of small or nondisplaced fractures. Multilevel degenerative changes are present in the imaged portions of the spine. No acute  osseous abnormality or suspicious osseous lesion. No worrisome or suspicious lytic or blastic lesions are identified. No worrisome chest wall masses or lesions. Mild body wall edema. IMPRESSION: 1. Multiple partially calcified mediastinal and hilar lymph nodes, as well as multiple solid and partially calcified nodules throughout both lungs predominantly in a perilymphatic distribution. Given the interval stability from comparison CT in 2021, stable sequela of prior granulomatous disease is favored though certainly given the extent of this finding and the appearance of the abdomen, smaller metastatic adenopathy could be obscured particularly on this noncontrast CT. 2. Redemonstration of the cirrhotic configuration liver with numerous metastases and upper abdominal ascites. 3. Hydronephrosis of the left renal moiety. 4. Aortic Atherosclerosis (ICD10-I70.0). These results were called by telephone at the time of interpretation on 04/06/2021 at 12:39 am to provider [facility] , who verbally acknowledged these results. Electronically Signed   By: Lovena Le M.D.   On: 04/06/2021 00:42   CT Abdomen Pelvis W Contrast  Result Date: 04/05/2021 CLINICAL DATA:  Abdominal pain. EXAM: CT ABDOMEN AND PELVIS WITH CONTRAST TECHNIQUE: Multidetector CT  imaging of the abdomen and pelvis was performed using the standard protocol following bolus administration of intravenous contrast. CONTRAST:  64m OMNIPAQUE IOHEXOL 300 MG/ML  SOLN COMPARISON:  June 29, 2020 FINDINGS: Lower chest: Mild atelectasis is seen within the bilateral lung bases. Hepatobiliary: The liver is cirrhotic in appearance. Innumerable heterogeneous low-attenuation liver lesions are seen. This represents a new finding when compared to the prior exam. No gallstones, gallbladder wall thickening, or biliary dilatation. Pancreas: Unremarkable. No pancreatic ductal dilatation or surrounding inflammatory changes. Spleen: Normal in size without focal abnormality. Adrenals/Urinary Tract: Adrenal glands are unremarkable. 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 Stable marked severity left-sided hydronephrosis is noted. Bladder is unremarkable. Stomach/Bowel: There is a small hiatal hernia. The appendix is surgically absent. No evidence of bowel dilatation. Noninflamed diverticula are seen throughout the large bowel. Vascular/Lymphatic: Aortic atherosclerosis. No enlarged abdominal or pelvic lymph nodes. Reproductive: The uterus is not clearly identified. Other: A 2.3 cm x 1.6 cm umbilical hernia is seen. This contains fat and a small amount of soft tissue attenuation. An extensive amount of soft tissue attenuation is seen throughout the mesentery of the anterolateral aspect of the left lower quadrant and left hemipelvis. This represents a new finding when compared to the prior study. There is a mild amount of abdominal and pelvic free fluid. Musculoskeletal: Multilevel degenerative changes seen throughout the lumbar spine. IMPRESSION: 1. Cirrhotic liver with additional findings consistent with diffuse hepatic metastasis. 2. Extensive peritoneal metastasis within the left lower quadrant and left hemipelvis. 3. 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 4. Small hiatal  hernia. 5. Colonic diverticulosis. 6. Aortic atherosclerosis. Aortic Atherosclerosis (ICD10-I70.0). Electronically Signed   By: TVirgina NorfolkM.D.   On: 04/05/2021 22:29   [place]acentesis  Result Date: 04/25/2021 INDICATION: 70year old female with a history of abdominal pain and new ascites EXAM: ULTRASOUND GUIDED  PARACENTESIS MEDICATIONS: None. COMPLICATIONS: None PROCEDURE: Informed written consent was obtained from the patient after a discussion of the risks, benefits and alternatives to treatment. A timeout was performed prior to the initiation of the procedure. Initial ultrasound scanning demonstrates a small amount of ascites within the right lower abdominal quadrant. The right lower abdomen was prepped and draped in the usual sterile fashion. 1% lidocaine was used for local anesthesia. Following this, a 8 Fr Safe-T-Centesis catheter was introduced. An ultrasound image was saved for documentation purposes. The paracentesis  was performed. The catheter was removed and a dressing was applied. The patient tolerated the procedure well without immediate post procedural complication. FINDINGS: A total of approximately 1.75 L of serosanguineous fluid was removed. Samples were sent to the laboratory as requested by the clinical team. IMPRESSION: Status post ultrasound-guided paracentesis. Signed, Dulcy Fanny. Dellia Nims, RPVI [facility] Electronically Signed   By: Corrie Mckusick D.O.   On: 04/25/2021 12:28    PERFORMANCE STATUS (ECOG) : 3 - Symptomatic, >50% confined to bed  Review of Systems Unless otherwise noted, a complete review of systems is negative.  Physical Exam General: NAD Pulmonary: Unlabored Extremities: no edema, no joint deformities Skin: no rashes Neurological: Weakness, some confusion  IMPRESSION: Follow-up visit.  Patient has intermittent confusion but is awake and answers simple questions.  She continues to endorse  abdominal pain but has only received sporadic dosing of Norco as she apparently has been reluctant to take medications.  Discussed option of starting her on transdermal fentanyl to provide her with longer acting pain control.  Patient was in agreement with this.  We will start transdermal fentanyl 12 mcg every 72 hours.  Continue Norco as needed for breakthrough pain.  I met with patient's cousin, Tedra Coupe.  She feels that patient has declined significantly and is likely too weak to undergo significant cancer treatment.  Plan is for patient to return to SNF and we discussed the option for palliative care versus hospice to follow.  I fear that patient's poor performance status and poor oral intake could herald further decline.  Hospice would be a reasonable option.  Cousin plans to speak with patient more in-depth regarding goals.  PLAN: -Continue current scope of treatment -Start transdermal fentanyl 12 mcg every 72 hours -Continue Norco as needed for breakthrough pain -Daily bowel regimen -Dispo: Likely SNF with palliative care versus hospice  Case and plan discussed with Drs. Grayland Ormond and [employer]  Time Total: 30 minutes  Visit consisted of counseling and education dealing with the complex and emotionally intense issues of symptom management and palliative care in the setting of serious and potentially life-threatening illness.Greater than 50%  of this time was spent counseling and coordinating care related to the above assessment and plan.  Signed by: Altha Harm, PhD, NP-C

## 2021-04-28 DIAGNOSIS — Z515 Encounter for palliative care: Secondary | ICD-10-CM | POA: Diagnosis not present

## 2021-04-28 DIAGNOSIS — E875 Hyperkalemia: Secondary | ICD-10-CM | POA: Diagnosis not present

## 2021-04-28 LAB — BASIC METABOLIC PANEL
Anion gap: 9 (ref 5–15)
BUN: 59 mg/dL — ABNORMAL HIGH (ref 8–23)
CO2: 19 mmol/L — ABNORMAL LOW (ref 22–32)
Calcium: 9.3 mg/dL (ref 8.9–10.3)
Chloride: 110 mmol/L (ref 98–111)
Creatinine, Ser: 2.17 mg/dL — ABNORMAL HIGH (ref 0.44–1.00)
GFR, Estimated: 24 mL/min — ABNORMAL LOW (ref >60)
Glucose, Bld: 211 mg/dL — ABNORMAL HIGH (ref 70–99)
Potassium: 5.7 mmol/L — ABNORMAL HIGH (ref 3.5–5.1)
Sodium: 138 mmol/L (ref 135–145)

## 2021-04-28 LAB — GLUCOSE, CAPILLARY
Glucose-Capillary: 202 mg/dL — ABNORMAL HIGH (ref 70–99)
Glucose-Capillary: 191 mg/dL — ABNORMAL HIGH (ref 70–99)
Glucose-Capillary: 199 mg/dL — ABNORMAL HIGH (ref 70–99)
Glucose-Capillary: 193 mg/dL — ABNORMAL HIGH (ref 70–99)
Glucose-Capillary: 251 mg/dL — ABNORMAL HIGH (ref 70–99)

## 2021-04-28 LAB — MAGNESIUM: Magnesium: 1.9 mg/dL (ref 1.7–2.4)

## 2021-04-28 IMAGING — CT CT CHEST W/ CM
1 series · 14 of 34 positions shown, 18 images · IV contrast (omnipaque)
Comparison: Chest radiographs dated 06/29/2020 05/27/2011. Abdomen
and pelvis CT dated 09/22/2019.

CLINICAL DATA: Two small left upper lobe nodular densities on
recent chest radiographs. Bilateral hilar fullness on those and
previous radiographs.

EXAM:
CT CHEST WITH CONTRAST
TECHNIQUE: Multidetector CT imaging of the chest was performed during
intravenous contrast administration.
CONTRAST:  75mL OMNIPAQUE IOHEXOL 300 MG/ML  SOLN

[Series 2: axial st · axial · 0.70mm/px · z∈[-786,-542]mm · 14 of 144 slices shown, 18 images]
[im 11/144  mediastinal]
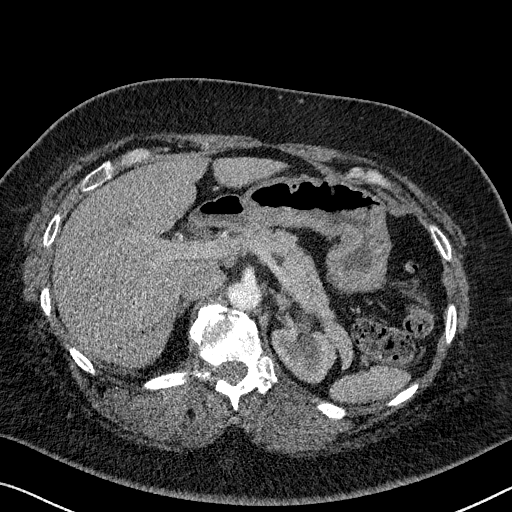
[im 11/144  lung]
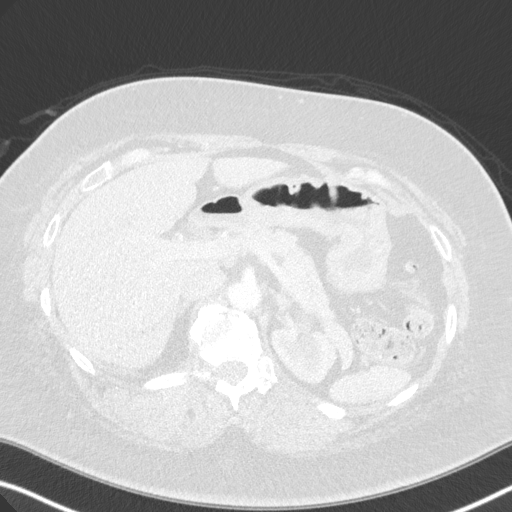
[im 22/144  lung]
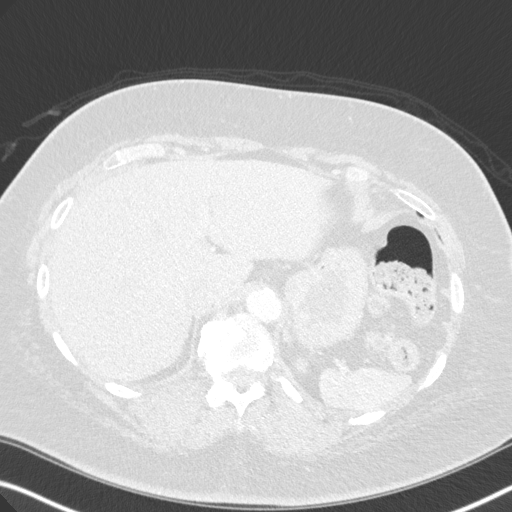
[im 29/144  lung]
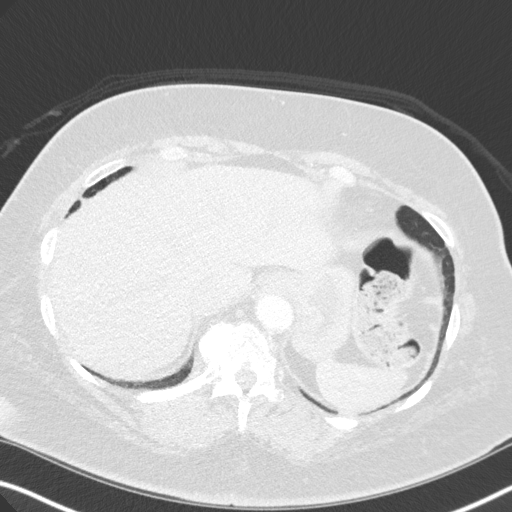
[im 43/144  lung]
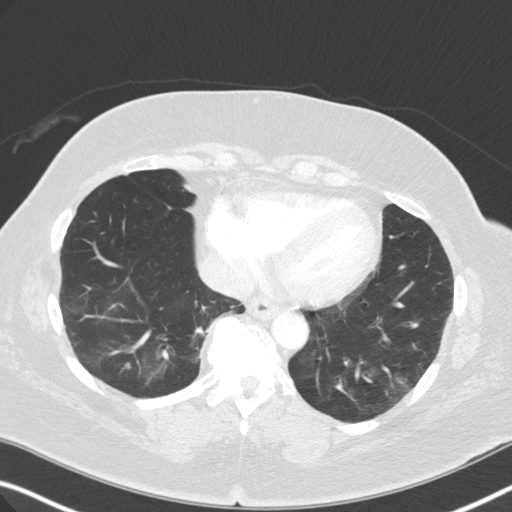
[im 53/144  mediastinal]
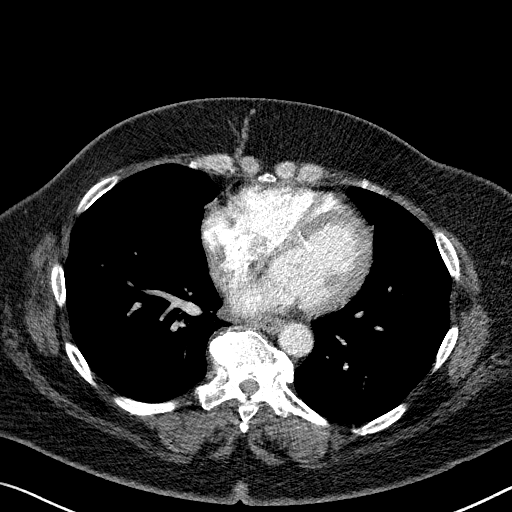
[im 53/144  lung]
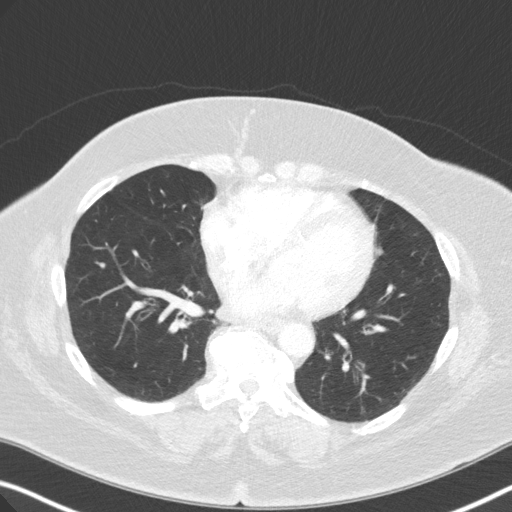
[im 59/144  lung]
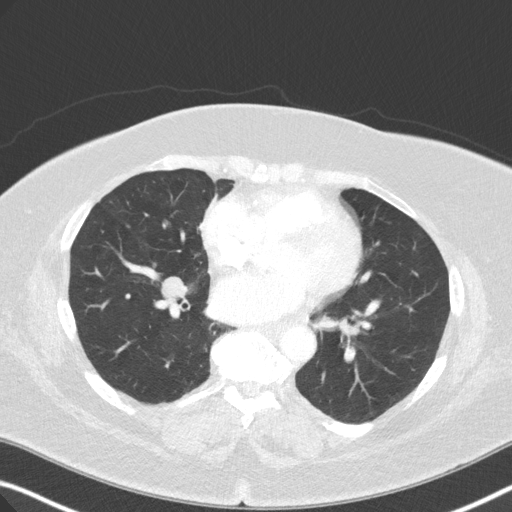
[im 68/144  lung]
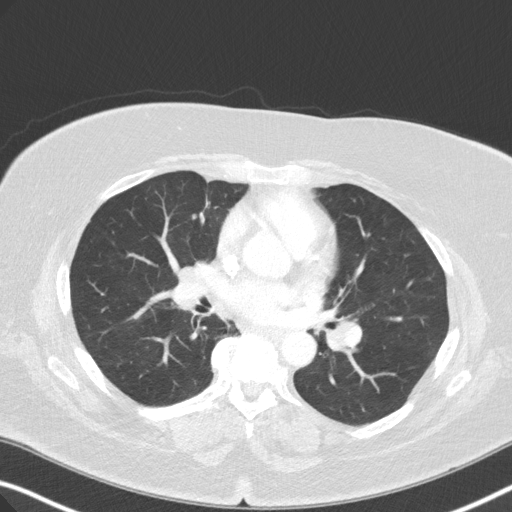
[im 77/144  lung]
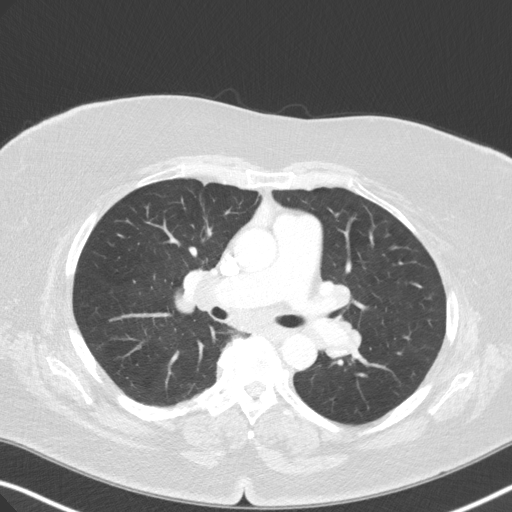
[im 85/144  mediastinal]
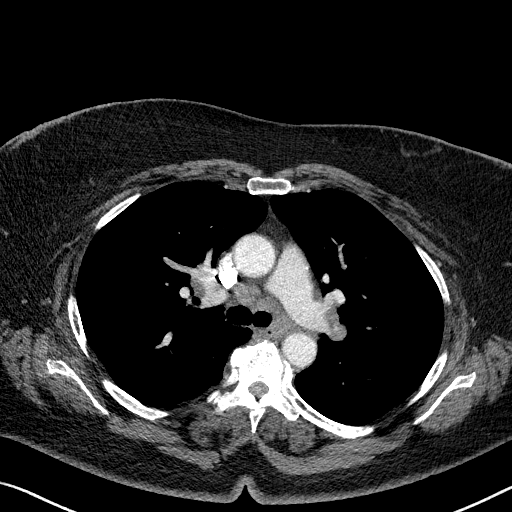
[im 85/144  lung]
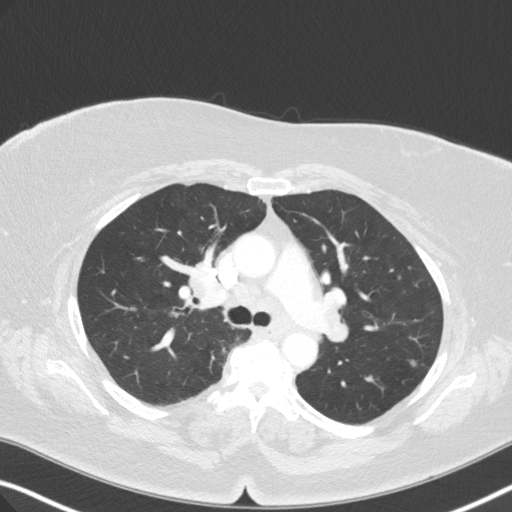
[im 91/144  lung]
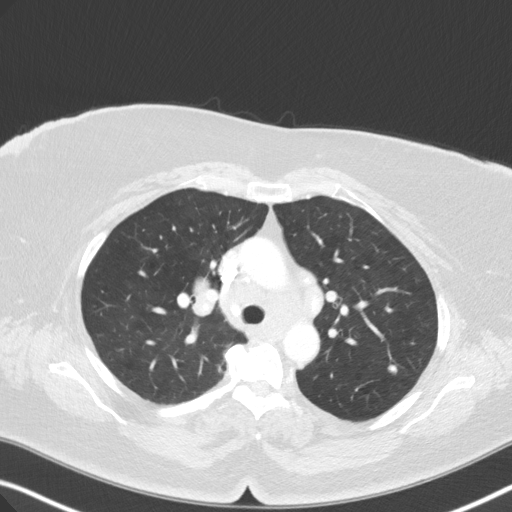
[im 106/144  lung]
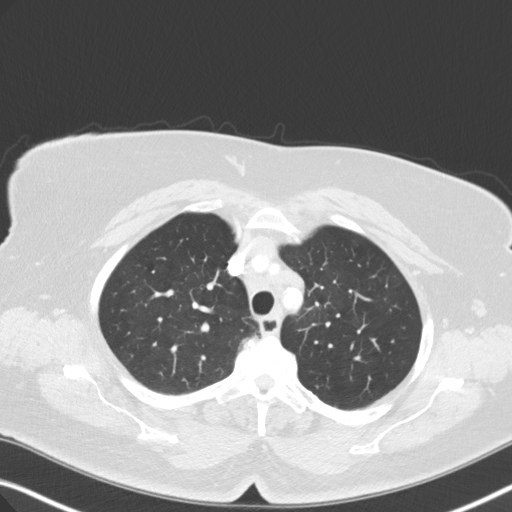
[im 115/144  lung]
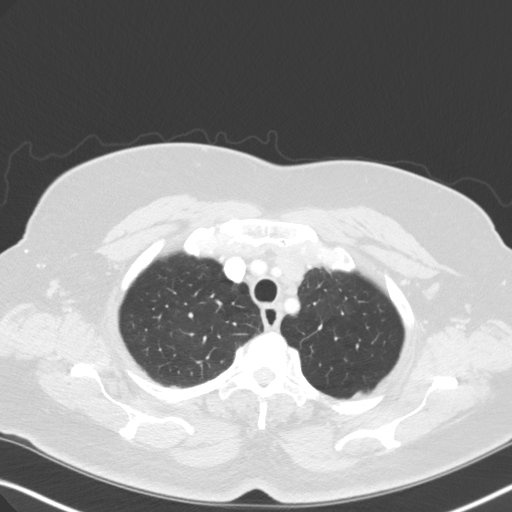
[im 122/144  mediastinal]
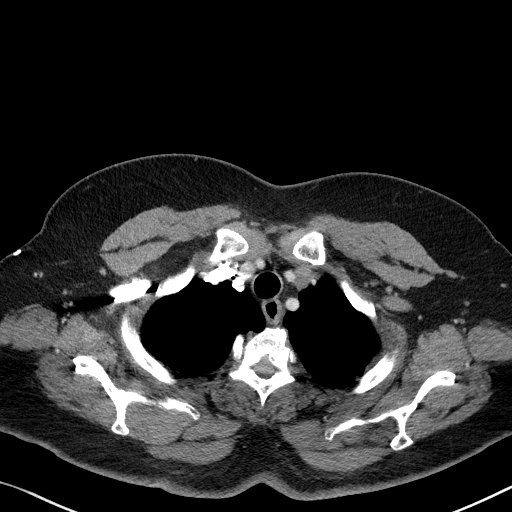
[im 122/144  lung]
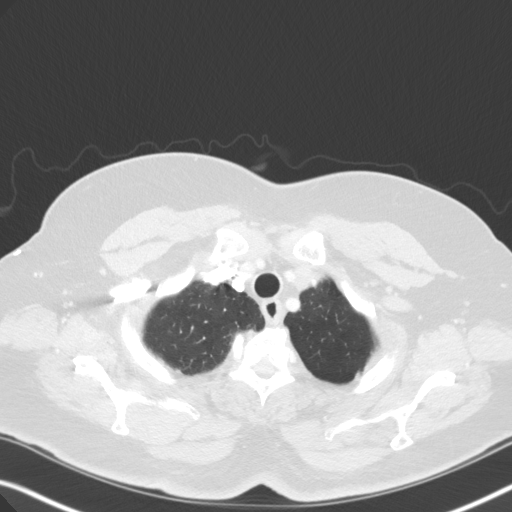
[im 133/144  lung]
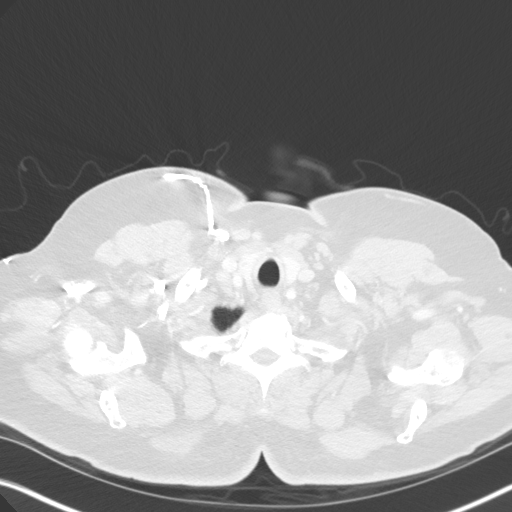

[14 of 34 positions shown; findings below may reference images not displayed]

FINDINGS: Cardiovascular: Atheromatous calcifications, including the coronary
arteries and aorta. Heart size near the upper limit of normal with
prominent left atrium and left ventricle.

Mediastinum/Nodes: Multiple mildly enlarged mediastinal and
bilateral hilar lymph nodes with central calcifications and
additional noncalcified similar-appearing nodes. Radiographically,
those are unchanged since 05/27/2011.

[DATE] mm right lobe thyroid nodule. Not clinically significant; no
follow-up imaging recommended (ref: [HOSPITAL]. [DATE]): 143-50).

Lungs/Pleura: 5 mm nodule containing a small amount of focal
calcification in the posterior aspect of the left upper lobe on
image number 54 series 3. 7 mm similar nodule with minimal
calcification on image number 59 series 3. This is along or within
the major fissure. Additional 6 mm nodule with focal central
calcification in the posterior right upper lobe on image number 63
series 3. These correspond to the radiographic nodules.

No additional lung nodules. No pleural fluid. Small amount of linear
atelectasis or scarring at both lung bases.

Upper Abdomen: Partially included left kidney demonstrating moderate
dilatation of renal collecting system. This is unchanged since
09/22/2019, when a horseshoe kidney was seen and the dilatation was
to the level of the ureteropelvic junction, without significant
change since 07/08/2018 at that time.

Musculoskeletal: Extensive thoracic and lower cervical spine
degenerative changes.
IMPRESSION: 1. Changes of previous granulomatous infection with left lung
calcified granulomata and calcified and noncalcified chronic
mediastinal and bilateral hilar adenopathy.
2. Previously demonstrated horseshoe kidney with chronic left UPJ
obstruction.
3.  Calcific coronary artery and aortic atherosclerosis.

Aortic Atherosclerosis ([id number]).

## 2021-04-28 NOTE — Progress Notes (Signed)
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 intake.  Patient's pain was addressed with use of intravenous and oral narcotics with short-lived results.  Hyperkalemia is persistent despite Lokelma and aggressive IV fluids.  Will redose Lokelma 10 g x 1 continue IV fluids.  Recheck creatinine in the morning.  Nephrology consult requested.  Also discussed the case with oncology and palliative care.  Patient was seen by her known oncologist Dr. Grayland Ormond as well as Altha Harm from palliative care service.  She has a DNR status after her discussions with Josh.  Will attempt to optimize pain regimen with use of oral Decadron for anti-inflammatory effect and IV hydromorphone.  5/5: Patient was seen in consultation by oncology, palliative care.  She was scheduled for liver biopsy today however after interventional radiology evaluation the patient was determined to be too high risk as she is reaccumulating ascites.  Liver biopsy on hold for now.  Attempting to optimize pain regimen.  Discussed with nephrology, imaging demonstrates worsening of known left-sided hydronephrosis.  Will discuss case with urology 5/6: Case discussed with urology.  No intervention from their standpoint.  They will see patient as outpatient 5/7: Seen in follow-up by palliative care.  Fentanyl patch ordered.  Patient continues to wince in some pain.  Encouraged to take pain medications if necessary.  Currently disposition plan SNF with hospice services.  Oncology palliative care did discuss with patient's  cousin at bedside.   Assessment & Plan:   Principal Problem:   Hyperkalemia Active Problems:   Type 2 diabetes mellitus with insulin therapy (HCC)   AKI (acute kidney injury) ([place])   Hypertension   Carcinosarcoma of endometrium (HCC)   Hydronephrosis   Abdominal pain  Acute kidney injury Worsening left-sided hydronephrosis with horseshoe kidney Creatinine improving with IV fluids Possibly prerenal but may be an obstructive component Potassium improved Nephrology on consult Worsening hydronephrosis discussed with urology.  Images reviewed and anatomic appearance is stable.  We will set up with outpatient Plan: Holding IV fluids for now.  We will recheck creatinine and reassess for need for IV fluids  Acute abdominal pain associated with vomiting Poor p.o. intake endometrial carcinoma with hepatic and peritoneal metastatic deposits Patient status post paracentesis on 5/4, 1.75 L serosanguineous fluid obtained Leukocytosis improving Biopsy scheduled 5/5 cannot be performed due to reaccumulating ascites Plan: Continue multimodal pain control.  Fentanyl patch added 5/6.  Continue as needed pain control and antiemetics as necessary.  Encourage p.o. intake.  Type 2 diabetes mellitus Patient on oral antihyperglycemic's at home Plan: Continue carb modified diet as tolerated.  Sliding scale coverage.  Oral diabetic medications on hold  Essential hypertension Home blood pressure agents on hold  DVT prophylaxis: SQ heparin Code Status: DNR Family Communication: Relative Teasia Zapf [phone] on 5/5 Disposition Plan: Status is: Inpatient  Remains inpatient appropriate because:Inpatient level of care appropriate due to severity of illness   Dispo: The patient is from: Home              Anticipated d/c is to: Home              Patient currently is  not medically stable to d/c.   Difficult to place patient No  Remains inpatient for pain control and poor p.o. intake.  Will  attempt to optimize pain regimen prior to disposition planning.     Level of care: Med-Surg  Consultants:   Oncology  Nephrology  Palliative care  Procedures:   Paracentesis, 5/4  Antimicrobials:   None   Subjective: Patient seen and examined.  Lethargic.  Opens eyes on command.  Answers questions appropriately.  Continues to wince in pain.  One-word answers.  Objective: Vitals:   04/27/21 2223 04/28/21 0100 04/28/21 0523 04/28/21 0829  BP: 138/79 (!) 162/89 (!) 155/85 (!) 145/89  Pulse: 100 (!) 103 (!) 102 (!) 106  Resp: 18 16 20 16   Temp: 98.4 F (36.9 C) 98.2 F (36.8 C) (!) 97.4 F (36.3 C) 97.7 F (36.5 C)  TempSrc: Oral Oral Oral Oral  SpO2: 95% 97% 98% 97%  Weight:      Height:        Intake/Output Summary (Last 24 hours) at 04/28/2021 1035 Last data filed at 04/28/2021 0525 Gross per 24 hour  Intake --  Output 1200 ml  Net -1200 ml   Filed Weights   04/24/21 1846  Weight: 93.9 kg    Examination:  General exam: Wincing in pain due to distress.  Sleepy Respiratory system: Poor respiratory effort.  Lungs clear.  Normal work of breathing.  Room air Cardiovascular system: S1 & S2 heard, RRR. No JVD, murmurs, rubs, gallops or clicks. No pedal edema. Gastrointestinal system: Nondistended.  Epigastric tenderness to palpation.  Normal bowel sounds  Central nervous system: Alert and oriented. No focal neurological deficits. Extremities: Symmetric 5 x 5 power. Skin: No rashes, lesions or ulcers Psychiatry: Judgement and insight appear impaired. Mood & affect flattened.     Data Reviewed: I have personally reviewed following labs and imaging studies  CBC: Recent Labs  Lab 04/24/21 1943 04/25/21 0330 04/26/21 0519 04/27/21 0907  WBC 13.5* 13.5* 13.0* 17.6*  NEUTROABS 11.5*  --  10.9*  --   HGB 10.5* 10.8* 9.8* 10.3*  HCT 32.4* 33.9* 30.6* 32.1*  MCV 86.9 87.6 87.9 86.8  PLT 317 321 290 536   Basic Metabolic Panel: Recent Labs  Lab  04/24/21 1943 04/25/21 0330 04/26/21 0519 04/27/21 0907  NA 135 136 140 139  K 6.2* 5.9* 5.2* 5.7*  CL 102 106 112* 111  CO2 21* 20* 20* 18*  GLUCOSE 149* 141* 137* 193*  BUN 71* 66* 56* 49*  CREATININE 3.04* 3.00* 2.19* 2.18*  CALCIUM 9.4 9.2 8.9 9.2  PHOS  --   --   --  3.4   GFR: Estimated Creatinine Clearance: 26 mL/min (A) (by C-G formula based on SCr of 2.18 mg/dL (H)). Liver Function Tests: Recent Labs  Lab 04/24/21 1943 04/27/21 0907  AST 42*  --   ALT 14  --   ALKPHOS 344*  --   BILITOT 1.1  --   PROT 8.2*  --   ALBUMIN 2.9* 2.3*   Recent Labs  Lab 04/24/21 1943  LIPASE 49   No results for input(s): AMMONIA in the last 168 hours. Coagulation Profile: Recent Labs  Lab 04/26/21 0519  INR 1.1   Cardiac Enzymes: No results for input(s): CKTOTAL, CKMB, CKMBINDEX, TROPONINI in the last 168 hours. BNP (last 3 results) No results for input(s): PROBNP in the last 8760 hours. HbA1C: No results for input(s): HGBA1C in the last 72 hours. CBG: Recent Labs  Lab 04/27/21  4259 04/27/21 1239 04/27/21 1616 04/27/21 2217 04/28/21 0827  GLUCAP 168* 187* 191* 198* 202*   Lipid Profile: No results for input(s): CHOL, HDL, LDLCALC, TRIG, CHOLHDL, LDLDIRECT in the last 72 hours. Thyroid Function Tests: No results for input(s): TSH, T4TOTAL, FREET4, T3FREE, THYROIDAB in the last 72 hours. Anemia Panel: No results for input(s): VITAMINB12, FOLATE, FERRITIN, TIBC, IRON, RETICCTPCT in the last 72 hours. Sepsis Labs: Recent Labs  Lab 04/24/21 1943  LATICACIDVEN 1.7    Recent Results (from the past 240 hour(s))  Resp Panel by RT-PCR (Flu A&B, Covid) Nasopharyngeal Swab     Status: None   Collection Time: 04/25/21 12:45 AM   Specimen: Nasopharyngeal Swab; Nasopharyngeal(NP) swabs in vial transport medium  Result Value Ref Range Status   SARS Coronavirus 2 by RT PCR NEGATIVE NEGATIVE Final    Comment: (NOTE) SARS-CoV-2 target nucleic acids are NOT  DETECTED.  The SARS-CoV-2 RNA is generally detectable in upper respiratory specimens during the acute phase of infection. The lowest concentration of SARS-CoV-2 viral copies this assay can detect is 138 copies/mL. A negative result does not preclude SARS-Cov-2 infection and should not be used as the sole basis for treatment or other patient management decisions. A negative result may occur with  improper specimen collection/handling, submission of specimen other than nasopharyngeal swab, presence of viral mutation(s) within the areas targeted by this assay, and inadequate number of viral copies(<138 copies/mL). A negative result must be combined with clinical observations, patient history, and epidemiological information. The expected result is Negative.  Fact Sheet for Patients:  EntrepreneurPulse.com.au  Fact Sheet for Healthcare Providers:  IncredibleEmployment.be  This test is no t yet approved or cleared by the [place] FDA and  has been authorized for detection and/or diagnosis of SARS-CoV-2 by FDA under an Emergency Use Authorization (EUA). This EUA will remain  in effect (meaning this test can be used) for the duration of the COVID-19 declaration under Section 564(b)(1) of the Act, 21 U.S.C.section 360bbb-3(b)(1), unless the authorization is terminated  or revoked sooner.       Influenza A by PCR NEGATIVE NEGATIVE Final   Influenza B by PCR NEGATIVE NEGATIVE Final    Comment: (NOTE) The Xpert Xpress SARS-CoV-2/FLU/RSV plus assay is intended as an aid in the diagnosis of influenza from Nasopharyngeal swab specimens and should not be used as a sole basis for treatment. Nasal washings and aspirates are unacceptable for Xpert Xpress SARS-CoV-2/FLU/RSV testing.  Fact Sheet for Patients: EntrepreneurPulse.com.au  Fact Sheet for Healthcare Providers: IncredibleEmployment.be  This test is not yet  approved or cleared by the [place] FDA and has been authorized for detection and/or diagnosis of SARS-CoV-2 by FDA under an Emergency Use Authorization (EUA). This EUA will remain in effect (meaning this test can be used) for the duration of the COVID-19 declaration under Section 564(b)(1) of the Act, 21 U.S.C. section 360bbb-3(b)(1), unless the authorization is terminated or revoked.  Performed at [facility], [address] [place], [place] [address]   Body fluid culture w Gram Stain     Status: None (Preliminary result)   Collection Time: 04/25/21 12:10 PM   Specimen: PATH Cytology Peritoneal fluid  Result Value Ref Range Status   Specimen Description   Final    PERITONEAL Performed at [facility], [address], [place], [place] [address]    Special Requests   Final    NONE Performed at [facility], [address] [place], [place] [address]  Gram Stain   Final    MODERATE WBC PRESENT,BOTH PMN AND MONONUCLEAR NO ORGANISMS SEEN    Culture   Final    NO GROWTH 3 DAYS Performed at [facility] [address], [place], [place] [address]    Report Status PENDING  Incomplete  MRSA PCR Screening     Status: None   Collection Time: 04/25/21  3:58 PM   Specimen: Nasopharyngeal  Result Value Ref Range Status   MRSA by PCR NEGATIVE NEGATIVE Final    Comment:        The GeneXpert MRSA Assay (FDA approved for NASAL specimens only), is one component of a comprehensive MRSA colonization surveillance program. It is not intended to diagnose MRSA infection nor to guide or monitor treatment for MRSA infections. Performed at [facility], [address], [place], [place] [address]          Radiology Studies: No results found.      Scheduled Meds: . dexamethasone  4 mg Oral Q8H  . feeding supplement  1 Container Oral TID BM  . fentaNYL  1 patch Transdermal Q72H  . heparin  5,000 Units Subcutaneous Q8H  .  insulin aspart  0-15 Units Subcutaneous TID WC  . insulin aspart  0-5 Units Subcutaneous QHS  . pneumococcal 23 valent vaccine  0.5 mL Intramuscular Tomorrow-1000   Continuous Infusions:    LOS: 3 days    Time spent: 25 minutes    Sidney Ace, MD [facility] Pager [phone]  If 7PM-7AM, please contact night-coverage 04/28/2021, 10:35 AM

## 2021-04-29 DIAGNOSIS — E875 Hyperkalemia: Secondary | ICD-10-CM | POA: Diagnosis not present

## 2021-04-29 DIAGNOSIS — Z515 Encounter for palliative care: Secondary | ICD-10-CM | POA: Diagnosis not present

## 2021-04-29 LAB — GLUCOSE, CAPILLARY
Glucose-Capillary: 256 mg/dL — ABNORMAL HIGH (ref 70–99)
Glucose-Capillary: 239 mg/dL — ABNORMAL HIGH (ref 70–99)
Glucose-Capillary: 389 mg/dL — ABNORMAL HIGH (ref 70–99)
Glucose-Capillary: 332 mg/dL — ABNORMAL HIGH (ref 70–99)

## 2021-04-29 LAB — CBC WITH DIFFERENTIAL/PLATELET
Abs Immature Granulocytes: 0.35 10*3/uL — ABNORMAL HIGH (ref 0.00–0.07)
Basophils Absolute: 0 10*3/uL (ref 0.0–0.1)
Basophils Relative: 0 %
Eosinophils Absolute: 0 10*3/uL (ref 0.0–0.5)
Eosinophils Relative: 0 %
HCT: 33.4 % — ABNORMAL LOW (ref 36.0–46.0)
Hemoglobin: 11 g/dL — ABNORMAL LOW (ref 12.0–15.0)
Immature Granulocytes: 2 %
Lymphocytes Relative: 3 %
Lymphs Abs: 0.4 10*3/uL — ABNORMAL LOW (ref 0.7–4.0)
MCH: 28.2 pg (ref 26.0–34.0)
MCHC: 32.9 g/dL (ref 30.0–36.0)
MCV: 85.6 fL (ref 80.0–100.0)
Monocytes Absolute: 0.5 10*3/uL (ref 0.1–1.0)
Monocytes Relative: 3 %
Neutro Abs: 14 10*3/uL — ABNORMAL HIGH (ref 1.7–7.7)
Neutrophils Relative %: 92 %
Platelets: 315 10*3/uL (ref 150–400)
RBC: 3.9 MIL/uL (ref 3.87–5.11)
RDW: 17.4 % — ABNORMAL HIGH (ref 11.5–15.5)
WBC: 15.3 10*3/uL — ABNORMAL HIGH (ref 4.0–10.5)
nRBC: 0.3 % — ABNORMAL HIGH (ref 0.0–0.2)

## 2021-04-29 LAB — BODY FLUID CULTURE W GRAM STAIN: Culture: NO GROWTH

## 2021-04-29 LAB — BASIC METABOLIC PANEL
Anion gap: 11 (ref 5–15)
BUN: 67 mg/dL — ABNORMAL HIGH (ref 8–23)
CO2: 19 mmol/L — ABNORMAL LOW (ref 22–32)
Calcium: 9.4 mg/dL (ref 8.9–10.3)
Chloride: 109 mmol/L (ref 98–111)
Creatinine, Ser: 1.79 mg/dL — ABNORMAL HIGH (ref 0.44–1.00)
GFR, Estimated: 30 mL/min — ABNORMAL LOW (ref >60)
Glucose, Bld: 270 mg/dL — ABNORMAL HIGH (ref 70–99)
Potassium: 5.3 mmol/L — ABNORMAL HIGH (ref 3.5–5.1)
Sodium: 139 mmol/L (ref 135–145)

## 2021-04-29 MED ORDER — DRONABINOL 2.5 MG PO CAPS
5.0000 mg | ORAL_CAPSULE | Freq: Two times a day (BID) | ORAL | Status: DC
  Administered 2021-04-29 – 2021-04-30 (×3): 5 mg via ORAL
  Filled 2021-04-29 (×3): qty 2

## 2021-04-29 NOTE — Progress Notes (Signed)
Family's cousin here to visit.

## 2021-04-29 NOTE — TOC Progression Note (Signed)
Transition of Care [facility]) - Progression Note    Patient Details  Name: [name] MRN: [id number] Date of Birth: Aug 26, 1951  [facility] CM/SW [place], LCSW Phone Number: 04/29/2021, 12:19 PM  Clinical Narrative:   Spoke to MD, per MD patient should be appropriate for ALF with hospice if ALF can take her back. Called [facility] and spoke to [place]. Varney Biles reported she thinks that should be fine, but advised that CSW call Monday morning and speak with Natividad Brood to confirm. Varney Biles reported she thinks they use [facility]. CSW will call tomorrow.   Expected Discharge Plan: Assisted Living Barriers to Discharge: Continued Medical Work up  Expected Discharge Plan and Services Expected Discharge Plan: Assisted Living   Discharge Planning Services: CM Consult   Living arrangements for the past 2 months: Assisted Living Facility                                       Social Determinants of Health (SDOH) Interventions    Readmission Risk Interventions No flowsheet data found.

## 2021-04-29 NOTE — Progress Notes (Signed)
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 intake.  Patient's pain was addressed with use of intravenous and oral narcotics with short-lived results.  Hyperkalemia is persistent despite Lokelma and aggressive IV fluids.  Will redose Lokelma 10 g x 1 continue IV fluids.  Recheck creatinine in the morning.  Nephrology consult requested.  Also discussed the case with oncology and palliative care.  Patient was seen by her known oncologist Dr. Grayland Harrison as well as [name] from palliative care service.  She has a DNR status after her discussions with [name].  Will attempt to optimize pain regimen with use of oral Decadron for anti-inflammatory effect and IV hydromorphone.  5/5: Patient was seen in consultation by oncology, palliative care.  She was scheduled for liver biopsy today however after interventional radiology evaluation the patient was determined to be too high risk as she is reaccumulating ascites.  Liver biopsy on hold for now.  Attempting to optimize pain regimen.  Discussed with nephrology, imaging demonstrates worsening of known left-sided hydronephrosis.  Will discuss case with urology 5/6: Case discussed with urology.  No intervention from their standpoint.  They will see patient as outpatient 5/7: Seen in follow-up by palliative care.  Fentanyl patch ordered.  Patient continues to wince in some pain.  Encouraged to take pain medications if necessary.  Currently disposition plan SNF with hospice services.  Oncology palliative care did discuss with patient's  cousin at bedside. 5/8: Remains in pain but mental status appears to be improved.  Was sitting up in bed this morning.   Assessment & Plan:   Principal Problem:   Hyperkalemia Active Problems:   Type 2 diabetes mellitus with insulin therapy (HCC)   AKI (acute kidney injury) ([place])   Hypertension   Carcinosarcoma of endometrium (HCC)   Hydronephrosis   Abdominal pain  Acute kidney injury Worsening left-sided hydronephrosis with horseshoe kidney Creatinine improving with IV fluids Possibly prerenal but may be an obstructive component Potassium improved Nephrology on consult, signed off Worsening hydronephrosis discussed with urology.  Images reviewed and anatomic appearance is stable.  We will set up with outpatient Plan: No further IV fluids Encourage p.o. fluid intake  Acute abdominal pain associated with vomiting Poor p.o. intake endometrial carcinoma with hepatic and peritoneal metastatic deposits Patient status post paracentesis on 5/4, 1.75 L serosanguineous fluid obtained Leukocytosis improving Biopsy scheduled 5/5 cannot be performed due to reaccumulating ascites Plan: Continue multimodal pain control.  Fentanyl patch added 5/6.  Continue as needed pain control and antiemetics as necessary.  Encourage p.o. intake.  Marinol added 5/8  Type 2 diabetes mellitus Patient on oral antihyperglycemic's at home Plan: Continue carb modified diet as tolerated.  Sliding scale coverage.  Oral diabetic medications on hold  Essential hypertension Home blood pressure agents on hold  DVT prophylaxis: SQ heparin Code Status: DNR Family Communication: Relative [name] [phone] on 5/8 Disposition Plan: Status is: Inpatient  Remains inpatient appropriate because:Inpatient level of care appropriate due to severity of illness   Dispo: The patient is from: Home              Anticipated d/c is to: Home  Patient currently is not medically stable to d/c.    Difficult to place patient No  Remains inpatient for pain control and poor p.o. intake.  Will attempt to optimize pain regimen prior to disposition planning.     Level of care: Med-Surg  Consultants:   Oncology  Nephrology  Palliative care  Procedures:   Paracentesis, 5/4  Antimicrobials:   None   Subjective: Patient seen and examined.  Sitting up in bed.  Answers questions appropriately but usually one-word answers.  Occasionally wincing.  Objective: Vitals:   04/28/21 2011 04/29/21 0043 04/29/21 0606 04/29/21 0806  BP: (!) 143/91 (!) 154/92 (!) 155/97 (!) 156/105  Pulse: (!) 104 (!) 110 (!) 106 (!) 110  Resp: 16 16 18 20   Temp: 97.7 F (36.5 C) 97.9 F (36.6 C) (!) 97.4 F (36.3 C) 97.7 F (36.5 C)  TempSrc:  Oral Oral Oral  SpO2: 97% 100% 97% 98%  Weight:      Height:        Intake/Output Summary (Last 24 hours) at 04/29/2021 1106 Last data filed at 04/29/2021 0651 Gross per 24 hour  Intake 237 ml  Output 1175 ml  Net -938 ml   Filed Weights   04/24/21 1846  Weight: 93.9 kg    Examination:  General exam: Mild distress due to pain.  Sitting up in bed Respiratory system: Lungs clear.  Normal work of breathing.  Room air Cardiovascular system: S1 & S2 heard, RRR. No JVD, murmurs, rubs, gallops or clicks. No pedal edema. Gastrointestinal system: Nondistended.  Epigastric tenderness to palpation.  Normal bowel sounds  Central nervous system: Alert and oriented. No focal neurological deficits. Extremities: Symmetric 5 x 5 power. Skin: No rashes, lesions or ulcers Psychiatry: Judgement and insight appear impaired. Mood & affect flattened.     Data Reviewed: I have personally reviewed following labs and imaging studies  CBC: Recent Labs  Lab 04/24/21 1943 04/25/21 0330 04/26/21 0519 04/27/21 0907  WBC 13.5* 13.5* 13.0* 17.6*  NEUTROABS 11.5*  --  10.9*  --   HGB 10.5* 10.8* 9.8* 10.3*  HCT 32.4* 33.9* 30.6* 32.1*  MCV 86.9 87.6 87.9 86.8   PLT 317 321 290 161   Basic Metabolic Panel: Recent Labs  Lab 04/24/21 1943 04/25/21 0330 04/26/21 0519 04/27/21 0907 04/28/21 1048  NA 135 136 140 139 138  K 6.2* 5.9* 5.2* 5.7* 5.7*  CL 102 106 112* 111 110  CO2 21* 20* 20* 18* 19*  GLUCOSE 149* 141* 137* 193* 211*  BUN 71* 66* 56* 49* 59*  CREATININE 3.04* 3.00* 2.19* 2.18* 2.17*  CALCIUM 9.4 9.2 8.9 9.2 9.3  MG  --   --   --   --  1.9  PHOS  --   --   --  3.4  --    GFR: Estimated Creatinine Clearance: 26.1 mL/min (A) (by C-G formula based on SCr of 2.17 mg/dL (H)). Liver Function Tests: Recent Labs  Lab 04/24/21 1943 04/27/21 0907  AST 42*  --   ALT 14  --   ALKPHOS 344*  --   BILITOT 1.1  --   PROT 8.2*  --   ALBUMIN 2.9* 2.3*   Recent Labs  Lab 04/24/21 1943  LIPASE 49   No results for input(s): AMMONIA in the last 168 hours. Coagulation Profile: Recent Labs  Lab 04/26/21 0519  INR 1.1   Cardiac Enzymes: No results for input(s): CKTOTAL, CKMB, CKMBINDEX, TROPONINI in the last 168 hours. BNP (last 3  results) No results for input(s): PROBNP in the last 8760 hours. HbA1C: No results for input(s): HGBA1C in the last 72 hours. CBG: Recent Labs  Lab 04/28/21 1219 04/28/21 1653 04/28/21 2044 04/28/21 2202 04/29/21 0803  GLUCAP 191* 199* 193* 251* 256*   Lipid Profile: No results for input(s): CHOL, HDL, LDLCALC, TRIG, CHOLHDL, LDLDIRECT in the last 72 hours. Thyroid Function Tests: No results for input(s): TSH, T4TOTAL, FREET4, T3FREE, THYROIDAB in the last 72 hours. Anemia Panel: No results for input(s): VITAMINB12, FOLATE, FERRITIN, TIBC, IRON, RETICCTPCT in the last 72 hours. Sepsis Labs: Recent Labs  Lab 04/24/21 1943  LATICACIDVEN 1.7    Recent Results (from the past 240 hour(s))  Resp Panel by RT-PCR (Flu A&B, Covid) Nasopharyngeal Swab     Status: None   Collection Time: 04/25/21 12:45 AM   Specimen: Nasopharyngeal Swab; Nasopharyngeal(NP) swabs in vial transport medium  Result  Value Ref Range Status   SARS Coronavirus 2 by RT PCR NEGATIVE NEGATIVE Final    Comment: (NOTE) SARS-CoV-2 target nucleic acids are NOT DETECTED.  The SARS-CoV-2 RNA is generally detectable in upper respiratory specimens during the acute phase of infection. The lowest concentration of SARS-CoV-2 viral copies this assay can detect is 138 copies/mL. A negative result does not preclude SARS-Cov-2 infection and should not be used as the sole basis for treatment or other patient management decisions. A negative result may occur with  improper specimen collection/handling, submission of specimen other than nasopharyngeal swab, presence of viral mutation(s) within the areas targeted by this assay, and inadequate number of viral copies(<138 copies/mL). A negative result must be combined with clinical observations, patient history, and epidemiological information. The expected result is Negative.  Fact Sheet for Patients:  EntrepreneurPulse.com.au  Fact Sheet for Healthcare Providers:  IncredibleEmployment.be  This test is no t yet approved or cleared by the [place] FDA and  has been authorized for detection and/or diagnosis of SARS-CoV-2 by FDA under an Emergency Use Authorization (EUA). This EUA will remain  in effect (meaning this test can be used) for the duration of the COVID-19 declaration under Section 564(b)(1) of the Act, 21 U.S.C.section 360bbb-3(b)(1), unless the authorization is terminated  or revoked sooner.       Influenza A by PCR NEGATIVE NEGATIVE Final   Influenza B by PCR NEGATIVE NEGATIVE Final    Comment: (NOTE) The Xpert Xpress SARS-CoV-2/FLU/RSV plus assay is intended as an aid in the diagnosis of influenza from Nasopharyngeal swab specimens and should not be used as a sole basis for treatment. Nasal washings and aspirates are unacceptable for Xpert Xpress SARS-CoV-2/FLU/RSV testing.  Fact Sheet for  Patients: EntrepreneurPulse.com.au  Fact Sheet for Healthcare Providers: IncredibleEmployment.be  This test is not yet approved or cleared by the [place] FDA and has been authorized for detection and/or diagnosis of SARS-CoV-2 by FDA under an Emergency Use Authorization (EUA). This EUA will remain in effect (meaning this test can be used) for the duration of the COVID-19 declaration under Section 564(b)(1) of the Act, 21 U.S.C. section 360bbb-3(b)(1), unless the authorization is terminated or revoked.  Performed at [facility], [address] [place], [place] [address]   Body fluid culture w Gram Stain     Status: None   Collection Time: 04/25/21 12:10 PM   Specimen: PATH Cytology Peritoneal fluid  Result Value Ref Range Status   Specimen Description   Final    PERITONEAL Performed at [facility], [address] [place], [place] [address]  Special Requests   Final    NONE Performed at [facility], [place], [place] [address]    Gram Stain   Final    MODERATE WBC PRESENT,BOTH PMN AND MONONUCLEAR NO ORGANISMS SEEN    Culture   Final    NO GROWTH 3 DAYS Performed at [facility], [place] [address], [place], [place] [address]    Report Status 04/29/2021 FINAL  Final  MRSA PCR Screening     Status: None   Collection Time: 04/25/21  3:58 PM   Specimen: Nasopharyngeal  Result Value Ref Range Status   MRSA by PCR NEGATIVE NEGATIVE Final    Comment:        The GeneXpert MRSA Assay (FDA approved for NASAL specimens only), is one component of a comprehensive MRSA colonization surveillance program. It is not intended to diagnose MRSA infection nor to guide or monitor treatment for MRSA infections. Performed at [facility], [address] [place], [place] [address]          Radiology Studies: No results found.      Scheduled Meds: . dexamethasone  4 mg  Oral Q8H  . dronabinol  5 mg Oral BID AC  . feeding supplement  1 Container Oral TID BM  . fentaNYL  1 patch Transdermal Q72H  . heparin  5,000 Units Subcutaneous Q8H  . insulin aspart  0-15 Units Subcutaneous TID WC  . insulin aspart  0-5 Units Subcutaneous QHS  . pneumococcal 23 valent vaccine  0.5 mL Intramuscular Tomorrow-1000   Continuous Infusions:    LOS: 4 days    Time spent: 25 minutes    Sidney Ace, MD [facility] Pager [phone]  If 7PM-7AM, please contact night-coverage 04/29/2021, 11:06 AM

## 2021-04-30 DIAGNOSIS — E875 Hyperkalemia: Secondary | ICD-10-CM | POA: Diagnosis not present

## 2021-04-30 LAB — GLUCOSE, CAPILLARY
Glucose-Capillary: 402 mg/dL — ABNORMAL HIGH (ref 70–99)
Glucose-Capillary: 437 mg/dL — ABNORMAL HIGH (ref 70–99)
Glucose-Capillary: 279 mg/dL — ABNORMAL HIGH (ref 70–99)

## 2021-04-30 LAB — GLUCOSE, RANDOM
Glucose, Bld: 519 mg/dL (ref 70–99)
Glucose, Bld: 396 mg/dL — ABNORMAL HIGH (ref 70–99)

## 2021-04-30 MED ORDER — HYDROCODONE-ACETAMINOPHEN 5-325 MG PO TABS: 1.0000 | ORAL_TABLET | Freq: Four times a day (QID) | ORAL | 0 refills | Status: AC | PRN

## 2021-04-30 MED ORDER — BISACODYL 10 MG RE SUPP: 10.0000 mg | Freq: Every day | RECTAL | Status: DC | PRN

## 2021-04-30 MED ORDER — DEXAMETHASONE 4 MG PO TABS: 2.0000 mg | ORAL_TABLET | Freq: Two times a day (BID) | ORAL | Status: DC

## 2021-04-30 MED ORDER — DEXAMETHASONE 2 MG PO TABS: 2.0000 mg | ORAL_TABLET | Freq: Two times a day (BID) | ORAL | Status: AC

## 2021-04-30 MED ORDER — INSULIN GLARGINE 100 UNIT/ML ~~LOC~~ SOLN
10.0000 [IU] | Freq: Every day | SUBCUTANEOUS | Status: DC
  Administered 2021-04-30: 12:00:00 10 [IU] via SUBCUTANEOUS
  Filled 2021-04-30 (×2): qty 0.1

## 2021-04-30 MED ORDER — INSULIN ASPART 100 UNIT/ML IJ SOLN
15.0000 [IU] | Freq: Once | INTRAMUSCULAR | Status: AC
  Administered 2021-04-30: 11:00:00 15 [IU] via SUBCUTANEOUS

## 2021-04-30 MED ORDER — FENTANYL 12 MCG/HR TD PT72: 1.0000 | MEDICATED_PATCH | TRANSDERMAL | 0 refills | Status: AC

## 2021-04-30 MED ORDER — DRONABINOL 5 MG PO CAPS: 5.0000 mg | ORAL_CAPSULE | Freq: Two times a day (BID) | ORAL | 0 refills | Status: AC

## 2021-04-30 MED ORDER — SENNOSIDES-DOCUSATE SODIUM 8.6-50 MG PO TABS
1.0000 | ORAL_TABLET | Freq: Two times a day (BID) | ORAL | Status: DC
  Administered 2021-04-30: 1 via ORAL
  Filled 2021-04-30: qty 1

## 2021-04-30 MED ORDER — POLYETHYLENE GLYCOL 3350 17 G PO PACK
17.0000 g | PACK | Freq: Every day | ORAL | Status: DC
  Administered 2021-04-30: 17 g via ORAL
  Filled 2021-04-30: qty 1

## 2021-04-30 NOTE — Progress Notes (Addendum)
[facility]) Hospital Liaison RN note:  Received request from Oleh Genin, [facility] for hospice services at [facility] after discharge. Chart and patient information under review by [facility] physician. Hospice eligibility was approved.  Spoke with cousin, Tedra Coupe to initiate education related to hospice philosophy, services and to answer any questions. Ometta verbalized understanding and had no questions. Plan is to discharge back to [facility] once DME is in place. A hospital bed and OBT has been ordered.  Please send signed and completed DNR home with patient. Please provide prescriptions at discharge as needed to ensure ongoing symptom management.  Please call with any hospice related questions or concerns.  Thank you for the opportunity to participate in this patient's care.  Zandra Abts, RN [facility] Liaison  [phone]

## 2021-04-30 NOTE — TOC Transition Note (Addendum)
Transition of Care [facility]) - CM/SW Discharge Note   Patient Details  Name: [name] MRN: [id number] Date of Birth: [birth year]  [facility]) CM/SW Contact:  Magnus Ivan, LCSW Phone Number: 04/30/2021, 3:08 PM   Clinical Narrative:   Per [facility] Rep Kieth Brightly, hospital bed and bedside table will be delivered to ALF by 3:30 pm. CSW called Natividad Brood at [facility], she confirmed patient can discharge there today once this is delivered. Called relative Ometta who is in agreement with discharge today.   Printed EMS Paperwork, FL2 with DC meds, and Facesheet. Asked Nurse Secretary Cyril Mourning to place paperwork and DNR in patient's DC Packet.  Confirmed with Rachel Bo that hospital bed has been delivered. Faxing FL2 per his request since his pharmacy closes at 6. Fax # [phone]  [facility] called for Nonemergent Transport. Patient is second on list. RN notified.     Final next level of care: Assisted Living Barriers to Discharge: Barriers Resolved   Patient Goals and CMS Choice Patient states their goals for this hospitalization and ongoing recovery are:: ALF with hospice CMS Medicare.gov Compare Post Acute Care list provided to:: Patient Represenative (must comment) Choice offered to / list presented to :  Tedra Coupe - relative)  Discharge Placement                Patient to be transferred to facility by: EMS Name of family member notified: Ometta Patient and family notified of of transfer: 04/30/21  Discharge Plan and Services   Discharge Planning Services: CM Consult            DME Arranged: Hospital bed,Overbed table       Representative spoke with at DME Agency: arranged by [place]            Social Determinants of Health (Fellows) Interventions     Readmission Risk Interventions No flowsheet data found.

## 2021-04-30 NOTE — Progress Notes (Signed)
Inpatient Diabetes Program Recommendations  AACE/ADA: New Consensus Statement on Inpatient Glycemic Control   Target Ranges:  Prepandial:   less than 140 mg/dL      Peak postprandial:   less than 180 mg/dL (1-2 hours)      Critically ill patients:  140 - 180 mg/dL   Results for [name], [name] (MRN [id number]) as of 04/30/2021 10:25  Ref. Range 04/29/2021 08:03 04/29/2021 11:33 04/29/2021 16:33 04/29/2021 21:50 04/30/2021 08:37  Glucose-Capillary Latest Ref Range: 70 - 99 mg/dL 256 (H) 239 (H) 389 (H) 332 (H) 402 (H)   Review of Glycemic Control  Diabetes history: DM2 Outpatient Diabetes medications: Glipizide XL 10 mg daily Current orders for Inpatient glycemic control: Lantus 10 units daily (ordered today), Novolog 0-15 units TID with meals, Novolog 0-5 units QHS; Decadron 4 mg Q8H  Inpatient Diabetes Program Recommendations:    Insulin: If steroids are continued, please consider increasing Lantus to 10 units BID and ordering Novolog 5 units TID with meals for meal coverage if patient eats at least 50% of meals.  Thanks, Barnie Alderman, RN, MSN, CDE Diabetes Coordinator Inpatient Diabetes Program [phone] (Team Pager from 8am to 5pm)

## 2021-04-30 NOTE — Progress Notes (Signed)
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 intake.  Patient's pain was addressed with use of intravenous and oral narcotics with short-lived results.  Hyperkalemia is persistent despite Lokelma and aggressive IV fluids.  Will redose Lokelma 10 g x 1 continue IV fluids.  Recheck creatinine in the morning.  Nephrology consult requested.  Also discussed the case with oncology and palliative care.  Patient was seen by her known oncologist Dr. Grayland Ormond as well as Altha Harm from palliative care service.  She has a DNR status after her discussions with Josh.  Will attempt to optimize pain regimen with use of oral Decadron for anti-inflammatory effect and IV hydromorphone.  5/5: Patient was seen in consultation by oncology, palliative care.  She was scheduled for liver biopsy today however after interventional radiology evaluation the patient was determined to be too high risk as she is reaccumulating ascites.  Liver biopsy on hold for now.  Attempting to optimize pain regimen.  Discussed with nephrology, imaging demonstrates worsening of known left-sided hydronephrosis.  Will discuss case with urology 5/6: Case discussed with urology.  No intervention from their standpoint.  They will see patient as outpatient 5/7: Seen in follow-up by palliative care.  Fentanyl patch ordered.  Patient continues to wince in some pain.  Encouraged to take pain medications if necessary.  Currently disposition plan SNF with hospice services.  Oncology palliative care did discuss with patient's  cousin at bedside. 5/8: Remains in pain but mental status appears to be improved.  Was sitting up in bed this morning.   Assessment & Plan:   Principal Problem:   Hyperkalemia Active Problems:   Type 2 diabetes mellitus with insulin therapy (HCC)   AKI (acute kidney injury) ([place])   Hypertension   Carcinosarcoma of endometrium (HCC)   Hydronephrosis   Abdominal pain  Acute kidney injury Worsening left-sided hydronephrosis with horseshoe kidney Creatinine improving with IV fluids Possibly prerenal but may be an obstructive component Potassium improved Nephrology on consult, signed off Worsening hydronephrosis discussed with urology.  Images reviewed and anatomic appearance is stable.  We will set up with outpatient Plan: No further IV fluids Encourage p.o. fluid intake  Acute abdominal pain associated with vomiting Poor p.o. intake endometrial carcinoma with hepatic and peritoneal metastatic deposits Patient status post paracentesis on 5/4, 1.75 L serosanguineous fluid obtained Leukocytosis improving Biopsy scheduled 5/5 cannot be performed due to reaccumulating ascites Patient seems to be slowly improving although her functional status remains quite poor Plan: Continue multimodal pain control Continue fentanyl patch added 5/6 Continue Marinol added 5/8  Type 2 diabetes mellitus Patient on oral antihyperglycemic's at home Plan: Continue carb modified diet as tolerated.  Sliding scale coverage.  Oral diabetic medications on hold Lantus 10 units daily added in the setting of steroids  Essential hypertension Home blood pressure agents on hold  DVT prophylaxis: SQ heparin Code Status: DNR Family Communication: Relative Angelyn Osterberg [phone] on 5/8 Disposition Plan: Status is: Inpatient  Remains inpatient appropriate because:Inpatient level of care appropriate due to severity of illness   Dispo: The patient is from: Home  Anticipated d/c is to:  Home              Patient currently is not medically stable to d/c.   Difficult to place patient No  Remains inpatient for pain control and poor p.o. intake.  Case management looking into whether patient can return to assisted living facility with hospice.  Disposition plan pending.    Level of care: Med-Surg  Consultants:   Oncology  Nephrology  Palliative care  Procedures:   Paracentesis, 5/4  Antimicrobials:   None   Subjective: Patient seen and examined.  Sitting up in bed.  More awake this morning.  Remains with one-word answers.  Occasionally wincing in pain.  Objective: Vitals:   04/29/21 1938 04/30/21 0034 04/30/21 0433 04/30/21 0815  BP: (!) 165/108 (!) 150/91 (!) 152/91 (!) 152/89  Pulse: (!) 108 (!) 101 (!) 101 (!) 103  Resp: 16 16 16 17   Temp: (!) 97.5 F (36.4 C) 98.3 F (36.8 C) 98 F (36.7 C) 97.9 F (36.6 C)  TempSrc: Oral Oral Oral   SpO2: 100% 100% 100% 98%  Weight:      Height:        Intake/Output Summary (Last 24 hours) at 04/30/2021 1034 Last data filed at 04/30/2021 0400 Gross per 24 hour  Intake 220 ml  Output 950 ml  Net -730 ml   Filed Weights   04/24/21 1846  Weight: 93.9 kg    Examination:  General exam: Mild distress due to pain.  Sitting up in bed Respiratory system: Lungs clear.  Normal work of breathing.  Room air Cardiovascular system: S1 & S2 heard, RRR. No JVD, murmurs, rubs, gallops or clicks. No pedal edema. Gastrointestinal system: Nondistended.  Epigastric tenderness to palpation.  Normal bowel sounds  Central nervous system: Alert and oriented. No focal neurological deficits. Extremities: Symmetric 5 x 5 power. Skin: No rashes, lesions or ulcers Psychiatry: Judgement and insight appear impaired. Mood & affect flattened.     Data Reviewed: I have personally reviewed following labs and imaging studies  CBC: Recent Labs  Lab 04/24/21 1943 04/25/21 0330 04/26/21 0519 04/27/21 0907 04/29/21 1133  WBC  13.5* 13.5* 13.0* 17.6* 15.3*  NEUTROABS 11.5*  --  10.9*  --  14.0*  HGB 10.5* 10.8* 9.8* 10.3* 11.0*  HCT 32.4* 33.9* 30.6* 32.1* 33.4*  MCV 86.9 87.6 87.9 86.8 85.6  PLT 317 321 290 325 062   Basic Metabolic Panel: Recent Labs  Lab 04/25/21 0330 04/26/21 0519 04/27/21 0907 04/28/21 1048 04/29/21 1133  NA 136 140 139 138 139  K 5.9* 5.2* 5.7* 5.7* 5.3*  CL 106 112* 111 110 109  CO2 20* 20* 18* 19* 19*  GLUCOSE 141* 137* 193* 211* 270*  BUN 66* 56* 49* 59* 67*  CREATININE 3.00* 2.19* 2.18* 2.17* 1.79*  CALCIUM 9.2 8.9 9.2 9.3 9.4  MG  --   --   --  1.9  --   PHOS  --   --  3.4  --   --    GFR: Estimated Creatinine Clearance: 31.7 mL/min (A) (by C-G formula based on SCr of 1.79 mg/dL (H)). Liver Function Tests: Recent Labs  Lab 04/24/21 1943 04/27/21 0907  AST 42*  --   ALT 14  --   ALKPHOS 344*  --   BILITOT 1.1  --   PROT 8.2*  --   ALBUMIN 2.9* 2.3*   Recent Labs  Lab 04/24/21 1943  LIPASE 49   No results for input(s):  AMMONIA in the last 168 hours. Coagulation Profile: Recent Labs  Lab 04/26/21 0519  INR 1.1   Cardiac Enzymes: No results for input(s): CKTOTAL, CKMB, CKMBINDEX, TROPONINI in the last 168 hours. BNP (last 3 results) No results for input(s): PROBNP in the last 8760 hours. HbA1C: No results for input(s): HGBA1C in the last 72 hours. CBG: Recent Labs  Lab 04/29/21 0803 04/29/21 1133 04/29/21 1633 04/29/21 2150 04/30/21 0837  GLUCAP 256* 239* 389* 332* 402*   Lipid Profile: No results for input(s): CHOL, HDL, LDLCALC, TRIG, CHOLHDL, LDLDIRECT in the last 72 hours. Thyroid Function Tests: No results for input(s): TSH, T4TOTAL, FREET4, T3FREE, THYROIDAB in the last 72 hours. Anemia Panel: No results for input(s): VITAMINB12, FOLATE, FERRITIN, TIBC, IRON, RETICCTPCT in the last 72 hours. Sepsis Labs: Recent Labs  Lab 04/24/21 1943  LATICACIDVEN 1.7    Recent Results (from the past 240 hour(s))  Resp Panel by RT-PCR (Flu A&B,  Covid) Nasopharyngeal Swab     Status: None   Collection Time: 04/25/21 12:45 AM   Specimen: Nasopharyngeal Swab; Nasopharyngeal(NP) swabs in vial transport medium  Result Value Ref Range Status   SARS Coronavirus 2 by RT PCR NEGATIVE NEGATIVE Final    Comment: (NOTE) SARS-CoV-2 target nucleic acids are NOT DETECTED.  The SARS-CoV-2 RNA is generally detectable in upper respiratory specimens during the acute phase of infection. The lowest concentration of SARS-CoV-2 viral copies this assay can detect is 138 copies/mL. A negative result does not preclude SARS-Cov-2 infection and should not be used as the sole basis for treatment or other patient management decisions. A negative result may occur with  improper specimen collection/handling, submission of specimen other than nasopharyngeal swab, presence of viral mutation(s) within the areas targeted by this assay, and inadequate number of viral copies(<138 copies/mL). A negative result must be combined with clinical observations, patient history, and epidemiological information. The expected result is Negative.  Fact Sheet for Patients:  EntrepreneurPulse.com.au  Fact Sheet for Healthcare Providers:  IncredibleEmployment.be  This test is no t yet approved or cleared by the [place] FDA and  has been authorized for detection and/or diagnosis of SARS-CoV-2 by FDA under an Emergency Use Authorization (EUA). This EUA will remain  in effect (meaning this test can be used) for the duration of the COVID-19 declaration under Section 564(b)(1) of the Act, 21 U.S.C.section 360bbb-3(b)(1), unless the authorization is terminated  or revoked sooner.       Influenza A by PCR NEGATIVE NEGATIVE Final   Influenza B by PCR NEGATIVE NEGATIVE Final    Comment: (NOTE) The Xpert Xpress SARS-CoV-2/FLU/RSV plus assay is intended as an aid in the diagnosis of influenza from Nasopharyngeal swab specimens and should  not be used as a sole basis for treatment. Nasal washings and aspirates are unacceptable for Xpert Xpress SARS-CoV-2/FLU/RSV testing.  Fact Sheet for Patients: EntrepreneurPulse.com.au  Fact Sheet for Healthcare Providers: IncredibleEmployment.be  This test is not yet approved or cleared by the [place] FDA and has been authorized for detection and/or diagnosis of SARS-CoV-2 by FDA under an Emergency Use Authorization (EUA). This EUA will remain in effect (meaning this test can be used) for the duration of the COVID-19 declaration under Section 564(b)(1) of the Act, 21 U.S.C. section 360bbb-3(b)(1), unless the authorization is terminated or revoked.  Performed at [facility], [address] [place], [place] [address]   Body fluid culture w Gram Stain     Status: None   Collection Time: 04/25/21 12:10 PM  Specimen: PATH Cytology Peritoneal fluid  Result Value Ref Range Status   Specimen Description   Final    PERITONEAL Performed at [facility], [address] [place], [place] [address]    Special Requests   Final    NONE Performed at [facility], [address] [place], [place] [address]    Gram Stain   Final    MODERATE WBC PRESENT,BOTH PMN AND MONONUCLEAR NO ORGANISMS SEEN    Culture   Final    NO GROWTH 3 DAYS Performed at [facility], [place] [address] [place], [place] [address]    Report Status 04/29/2021 FINAL  Final  MRSA PCR Screening     Status: None   Collection Time: 04/25/21  3:58 PM   Specimen: Nasopharyngeal  Result Value Ref Range Status   MRSA by PCR NEGATIVE NEGATIVE Final    Comment:        The GeneXpert MRSA Assay (FDA approved for NASAL specimens only), is one component of a comprehensive MRSA colonization surveillance program. It is not intended to diagnose MRSA infection nor to guide or monitor treatment for MRSA infections. Performed at [facility], [address] [place], [place] [address]          Radiology Studies: No results found.      Scheduled Meds: . dexamethasone  2 mg Oral Q12H  . dronabinol  5 mg Oral BID AC  . feeding supplement  1 Container Oral TID BM  . fentaNYL  1 patch Transdermal Q72H  . heparin  5,000 Units Subcutaneous Q8H  . insulin aspart  0-15 Units Subcutaneous TID WC  . insulin aspart  0-5 Units Subcutaneous QHS  . insulin glargine  10 Units Subcutaneous Daily  . pneumococcal 23 valent vaccine  0.5 mL Intramuscular Tomorrow-1000  . polyethylene glycol  17 g Oral Daily  . senna-docusate  1 tablet Oral BID   Continuous Infusions:    LOS: 5 days    Time spent: 15 minutes    Sidney Ace, MD [facility] Pager [phone]  If 7PM-7AM, please contact night-coverage 04/30/2021, 10:34 AM

## 2021-04-30 NOTE — Discharge Summary (Signed)
Physician Discharge Summary  [name]:[id number] DOB: [birth year] DOA: 04/24/2021  PCP: Kirk Ruths, MD  Admit date: 04/24/2021 Discharge date: 04/30/2021  Admitted From: ALF Disposition: ALF with hospice  Recommendations for Outpatient Follow-up:  1. 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 intake.  Patient's pain was addressed with use of intravenous and oral narcotics with short-lived results. Hyperkalemia is persistent despite Lokelma and aggressive IV fluids. Will redose Lokelma 10 g x 1 continue IV fluids. Recheck creatinine in the morning. Nephrology consult requested. Also discussed the case with oncology and palliative care. Patient was seen by her known oncologist Dr. Grayland Ormond as well as Altha Harm from palliative care service. She has a DNR status after her discussions with Josh. Will attempt to optimize pain regimen with use of oral Decadron for anti-inflammatory effect and IV hydromorphone.  5/5: Patient was seen in consultation by oncology, palliative care.  She was scheduled for liver biopsy today however after interventional radiology evaluation the patient was determined to be too high risk as she is reaccumulating ascites.  Liver biopsy on hold for now.  Attempting to optimize pain regimen.  Discussed with nephrology, imaging demonstrates worsening of known left-sided hydronephrosis.  Will discuss case with urology 5/6: Case discussed with urology.  No intervention  from their standpoint.  They will see patient as outpatient 5/7: Seen in follow-up by palliative care.  Fentanyl patch ordered.  Patient continues to wince in some pain.  Encouraged to take pain medications if necessary.  Currently disposition plan SNF with hospice services.  Oncology palliative care did discuss with patient's cousin at bedside. 5/8: Remains in pain but mental status appears to be improved.  Was sitting up in bed this morning. 5/9: Seen and examined on the day of discharge.  Attempting to eat more.  Remains in abdominal pain.  P.o. intake remains poor.  After discussions with TOC and hospice liaison decision was made to return patient ALF with hospice services.  Hospice liaison involved.  Patient discharged.  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  Images reviewed and anatomic appearance is stable.  We will set up with outpatient Plan: Patient is being discharged to assisted living facility with hospice  Acute abdominal pain associated with vomiting Poor p.o. intake endometrial carcinoma with hepatic and peritoneal metastatic deposits Patient status post paracentesis on 5/4, 1.75 L serosanguineous fluid obtained Leukocytosis improving Biopsy scheduled 5/5 cannot be performed due to reaccumulating ascites Patient seems to be slowly improving although her functional status remains quite poor Plan: Patient be discharged to the assisted living facility with hospice.  Several doses of narcotic pain  medication left in paper chart.  Type 2 diabetes mellitus Patient on oral antihyperglycemic's at home Plan:  For glycemic control to outpatient hospice providers  Essential hypertension Home blood pressure agents on hold  Discharge Instructions  Discharge Instructions    Diet - low sodium heart healthy   Complete by: As directed    Increase activity slowly   Complete by: As directed      Allergies as of 04/30/2021      Reactions   Penicillins Swelling   Has patient had a PCN reaction causing immediate rash, facial/tongue/throat swelling, SOB or lightheadedness with hypotension: Unknown Has patient had a PCN reaction causing severe rash involving mucus membranes or skin necrosis: Unknown Has patient had a PCN reaction that required hospitalization: Unknown Has patient had a PCN reaction occurring within the last 10 years: Unknown If all of the above answers are "NO", then may proceed with Cephalosporin use.      Medication List    STOP taking these medications   atorvastatin 20 MG tablet Commonly known as: LIPITOR   ferrous sulfate 325 (65 FE) MG tablet   glipiZIDE 10 MG tablet Commonly known as: GLUCOTROL   levothyroxine 125 MCG tablet Commonly known as: SYNTHROID   ondansetron 4 MG tablet Commonly known as: ZOFRAN   pantoprazole 40 MG tablet Commonly known as: PROTONIX   sennosides-docusate sodium 8.6-50 MG tablet Commonly known as: SENOKOT-S   Zinc Sulfate 220 (50 Zn) MG Tabs     TAKE these medications   acetaminophen 325 MG tablet Commonly known as: TYLENOL Take 650 mg by mouth every 4 (four) hours as needed for mild pain or moderate pain.   alum & mag hydroxide-simeth 200-200-20 MG/5ML suspension Commonly known as: MAALOX/MYLANTA Take 30 mLs by mouth 4 (four) times daily as needed for indigestion or heartburn.   dexamethasone 2 MG tablet Commonly known as: DECADRON Take 1 tablet (2 mg total) by mouth every 12 (twelve) hours.   diphenhydrAMINE 25  MG tablet Commonly known as: BENADRYL Take 25 mg by mouth every 6 (six) hours as needed for allergies.   dronabinol 5 MG capsule Commonly known as: MARINOL Take 1 capsule (5 mg total) by mouth 2 (two) times daily before lunch and supper for 3 days.   eucerin cream Apply 1 application topically 2 (two) times daily. (apply to feet and legs)   fentaNYL 12 MCG/HR Commonly known as: [place] 1 patch onto the skin every 3 (three) days.   fluticasone 50 MCG/ACT nasal spray Commonly known as: FLONASE Place 2 sprays into both nostrils daily as needed for allergies.   gabapentin 300 MG capsule Commonly known as: NEURONTIN Take 300 mg by mouth 2 (two) times daily.   guaifenesin 100 MG/5ML syrup Commonly known as: ROBITUSSIN Take 300 mg by mouth every 6 (six) hours as needed for cough.   HYDROcodone-acetaminophen 5-325 MG tablet Commonly known as: NORCO/VICODIN Take 1 tablet by mouth every 6 (six) hours as needed for up to 3 days for moderate pain.   hydrOXYzine 25 MG capsule Commonly known as: VISTARIL Take 25 mg by mouth every 8 (eight) hours as needed for anxiety.   loperamide 2 MG tablet Commonly known as: IMODIUM A-D Take 2 mg by mouth as needed for diarrhea or loose stools. (max 8 doses in 24 hours)   magnesium hydroxide 400 MG/5ML suspension Commonly known as: MILK OF MAGNESIA Take 30 mLs by mouth daily as needed for mild constipation or moderate constipation.   ondansetron 4 MG disintegrating tablet Commonly known as: Zofran ODT Take 1 tablet (4 mg total) by mouth every 8 (  eight) hours as needed for nausea or vomiting.   tamsulosin 0.4 MG Caps capsule Commonly known as: FLOMAX Take 1 capsule (0.4 mg total) by mouth daily.       Allergies  Allergen Reactions  . Penicillins Swelling    Has patient had a PCN reaction causing immediate rash, facial/tongue/throat swelling, SOB or lightheadedness with hypotension: Unknown Has patient had a PCN reaction causing  severe rash involving mucus membranes or skin necrosis: Unknown Has patient had a PCN reaction that required hospitalization: Unknown Has patient had a PCN reaction occurring within the last 10 years: Unknown If all of the above answers are "NO", then may proceed with Cephalosporin use.     Consultations:  Palliative care  Oncology   Procedures/Studies: CT ABDOMEN PELVIS WO CONTRAST  Result Date: 04/24/2021 CLINICAL DATA:  Left-sided abdominal pain EXAM: CT ABDOMEN AND PELVIS WITHOUT CONTRAST TECHNIQUE: Multidetector CT imaging of the abdomen and pelvis was performed following the standard protocol without IV contrast. COMPARISON:  CT 04/05/2021 FINDINGS: Lower chest: Lung bases demonstrate no acute consolidation or pleural effusion. Scattered pulmonary nodules measuring up to 5 mm in size are redemonstrated. Incompletely visualized hilar lymph nodes some of which are calcified. Hepatobiliary: Numerous low-attenuation liver masses consistent with metastatic disease. Slight increased gallbladder density which may be due to sludge or small stones. No biliary dilatation Pancreas: Unremarkable. No pancreatic ductal dilatation or surrounding inflammatory changes. Spleen: Normal in size without focal abnormality. Adrenals/Urinary Tract: Adrenal glands are within normal limits. 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 Small stone in the dilated renal collecting system on the left. Urinary bladder is unremarkable. Stomach/Bowel: Stomach nonenlarged. No dilated small bowel. No acute bowel wall thickening. Vascular/Lymphatic: Moderate aortic atherosclerosis. No aneurysm. Right cardio phrenic adenopathy measuring up to 12 mm, previously 10 mm. Reproductive: Status post hysterectomy. Other: No free air. Increased ascites within the abdomen and pelvis. Widespread peritoneal metastatic disease with large soft tissue mesenteric mass in the lower abdomen and upper pelvis. Nodular infiltration  of left abdominal mesentery consistent with metastatic disease, this appears increased. Numerous masses within the left greater than right colic gutters and within perihepatic ascites fluid. Small ventral hernia containing fat and soft tissue masslike density. Loculated ascites adjacent to the rectum. Musculoskeletal: No acute or suspicious osseous abnormality. IMPRESSION: 1. Slight interval increase in abdominopelvic ascites since the prior exam. 2. Redemonstrated hepatic metastatic disease and widespread peritoneal metastatic disease 3. 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 Electronically Signed   By: Donavan Foil M.D.   On: 04/24/2021 23:56   CT CHEST WO CONTRAST  Result Date: 04/06/2021 CLINICAL DATA:  Cancer of unknown primary, multiple hepatic metastases EXAM: CT CHEST WITHOUT CONTRAST TECHNIQUE: Multidetector CT imaging of the chest was performed following the standard protocol without IV contrast. COMPARISON:  Same-day CT abdomen pelvis, CT chest 07/10/2020 FINDINGS: Cardiovascular: Limited assessment in the absence of contrast media. Normal heart size. No pericardial effusion. Coronary artery calcifications are present. Atherosclerotic plaque within the normal caliber aorta. Minimal plaque in the proximal great vessels which are otherwise unremarkable. No major venous abnormality is seen. Central pulmonary arteries are top-normal caliber. Mediastinum/Nodes: There are numerous partially calcified mediastinal and hilar lymph nodes including larger partially calcified paratracheal lymph node (2/55) measuring up to 18 mm short axis. A large partially calcified lymph node in the right hilum measures up to 14 mm short axis (2/56) additional hilar adenopathy is difficult fully assess in the absence of  intravenous contrast media. No acute abnormality of the trachea or esophagus. Thyroid gland is unremarkable. Lungs/Pleura: There are 3 separate partially calcified  nodule seen along the left major fissure measuring between 5-7 mm in size (3/52, 57, 61) which could reflect partially calcified intrapulmonary lymph nodes given this location. Additional Peri fissural nodule measuring 4 mm seen in the right lower lobe (3/77) some bandlike regions of scarring are seen in the medial left upper lobe with some slight architectural distortion bronchiectatic change as well as a flattened, part calcified nodule measuring up to 6 mm (3/83). Few Peri vascular nodules are noted in the right lower lobe as well (3/83, 93) measuring up to 3 mm in size. No consolidative process. No pneumothorax or visible effusion. Upper Abdomen: Multiple hepatic lesions and cirrhotic configuration of the liver are similar to prior. Small amount of ascites. Redemonstrated hydronephrosis of a left renal moiety. Musculoskeletal: The osseous structures appear diffusely demineralized which may limit detection of small or nondisplaced fractures. Multilevel degenerative changes are present in the imaged portions of the spine. No acute osseous abnormality or suspicious osseous lesion. No worrisome or suspicious lytic or blastic lesions are identified. No worrisome chest wall masses or lesions. Mild body wall edema. IMPRESSION: 1. Multiple partially calcified mediastinal and hilar lymph nodes, as well as multiple solid and partially calcified nodules throughout both lungs predominantly in a perilymphatic distribution. Given the interval stability from comparison CT in 2021, stable sequela of prior granulomatous disease is favored though certainly given the extent of this finding and the appearance of the abdomen, smaller metastatic adenopathy could be obscured particularly on this noncontrast CT. 2. Redemonstration of the cirrhotic configuration liver with numerous metastases and upper abdominal ascites. 3. Hydronephrosis of the left renal moiety. 4. Aortic Atherosclerosis (ICD10-I70.0). These results were called by  telephone at the time of interpretation on 04/06/2021 at 12:39 am to provider [facility] , who verbally acknowledged these results. Electronically Signed   By: Lovena Le M.D.   On: 04/06/2021 00:42   CT Abdomen Pelvis W Contrast  Result Date: 04/05/2021 CLINICAL DATA:  Abdominal pain. EXAM: CT ABDOMEN AND PELVIS WITH CONTRAST TECHNIQUE: Multidetector CT imaging of the abdomen and pelvis was performed using the standard protocol following bolus administration of intravenous contrast. CONTRAST:  31mL OMNIPAQUE IOHEXOL 300 MG/ML  SOLN COMPARISON:  June 29, 2020 FINDINGS: Lower chest: Mild atelectasis is seen within the bilateral lung bases. Hepatobiliary: The liver is cirrhotic in appearance. Innumerable heterogeneous low-attenuation liver lesions are seen. This represents a new finding when compared to the prior exam. No gallstones, gallbladder wall thickening, or biliary dilatation. Pancreas: Unremarkable. No pancreatic ductal dilatation or surrounding inflammatory changes. Spleen: Normal in size without focal abnormality. Adrenals/Urinary Tract: Adrenal glands are unremarkable. 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 Stable marked severity left-sided hydronephrosis is noted. Bladder is unremarkable. Stomach/Bowel: There is a small hiatal hernia. The appendix is surgically absent. No evidence of bowel dilatation. Noninflamed diverticula are seen throughout the large bowel. Vascular/Lymphatic: Aortic atherosclerosis. No enlarged abdominal or pelvic lymph nodes. Reproductive: The uterus is not clearly identified. Other: A 2.3 cm x 1.6 cm umbilical hernia is seen. This contains fat and a small amount of soft tissue attenuation. An extensive amount of soft tissue attenuation is seen throughout the mesentery of the anterolateral aspect of the left lower quadrant and left hemipelvis. This represents a new finding when compared to the prior study. There is a mild amount of abdominal and pelvic free  fluid. Musculoskeletal: Multilevel degenerative changes seen throughout the lumbar spine. IMPRESSION: 1. Cirrhotic liver with additional findings consistent with diffuse hepatic metastasis. 2. Extensive peritoneal metastasis within the left lower quadrant and left hemipelvis. 3. 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 4. Small hiatal hernia. 5. Colonic diverticulosis. 6. Aortic atherosclerosis. Aortic Atherosclerosis (ICD10-I70.0). Electronically Signed   By: Virgina Norfolk M.D.   On: 04/05/2021 22:29   US Paracentesis  Result Date: 04/25/2021 INDICATION: 70 year old female with a history of abdominal pain and new ascites EXAM: ULTRASOUND GUIDED  PARACENTESIS MEDICATIONS: None. COMPLICATIONS: None PROCEDURE: Informed written consent was obtained from the patient after a discussion of the risks, benefits and alternatives to treatment. A timeout was performed prior to the initiation of the procedure. Initial ultrasound scanning demonstrates a small amount of ascites within the right lower abdominal quadrant. The right lower abdomen was prepped and draped in the usual sterile fashion. 1% lidocaine was used for local anesthesia. Following this, a 8 Fr Safe-T-Centesis catheter was introduced. An ultrasound image was saved for documentation purposes. The paracentesis was performed. The catheter was removed and a dressing was applied. The patient tolerated the procedure well without immediate post procedural complication. FINDINGS: A total of approximately 1.75 L of serosanguineous fluid was removed. Samples were sent to the laboratory as requested by the clinical team. IMPRESSION: Status post ultrasound-guided paracentesis. Signed, Dulcy Fanny. Dellia Nims, RPVI [facility] Electronically Signed   By: Corrie Mckusick D.O.   On: 04/25/2021 12:28    (Echo, Carotid, EGD, Colonoscopy, ERCP)    Subjective: Seen and examined the day  of discharge.  Continues with abdominal pain.  Will return to assisted living setting with hospice care.  Discharge Exam: Vitals:   04/30/21 0433 04/30/21 0815  BP: (!) 152/91 (!) 152/89  Pulse: (!) 101 (!) 103  Resp: 16 17  Temp: 98 F (36.7 C) 97.9 F (36.6 C)  SpO2: 100% 98%   Vitals:   04/29/21 1938 04/30/21 0034 04/30/21 0433 04/30/21 0815  BP: (!) 165/108 (!) 150/91 (!) 152/91 (!) 152/89  Pulse: (!) 108 (!) 101 (!) 101 (!) 103  Resp: 16 16 16 17   Temp: (!) 97.5 F (36.4 C) 98.3 F (36.8 C) 98 F (36.7 C) 97.9 F (36.6 C)  TempSrc: Oral Oral Oral   SpO2: 100% 100% 100% 98%  Weight:      Height:        General: Pt is alert, awake, not in acute distress Cardiovascular: RRR, S1/S2 +, no rubs, no gallops Respiratory: CTA bilaterally, no wheezing, no rhonchi Abdominal: Soft, NT, ND, bowel sounds + Extremities: no edema, no cyanosis    The results of significant diagnostics from this hospitalization (including imaging, microbiology, ancillary and laboratory) are listed below for reference.     Microbiology: Recent Results (from the past 240 hour(s))  Resp Panel by RT-PCR (Flu A&B, Covid) Nasopharyngeal Swab     Status: None   Collection Time: 04/25/21 12:45 AM   Specimen: Nasopharyngeal Swab; Nasopharyngeal(NP) swabs in vial transport medium  Result Value Ref Range Status   SARS Coronavirus 2 by RT PCR NEGATIVE NEGATIVE Final    Comment: (NOTE) SARS-CoV-2 target nucleic acids are NOT DETECTED.  The SARS-CoV-2 RNA is generally detectable in upper respiratory specimens during the acute phase of infection. The lowest concentration of SARS-CoV-2 viral copies this assay can detect is 138 copies/mL. A negative result does not preclude SARS-Cov-2 infection and should not be used as  the sole basis for treatment or other patient management decisions. A negative result may occur with  improper specimen collection/handling, submission of specimen other than nasopharyngeal  swab, presence of viral mutation(s) within the areas targeted by this assay, and inadequate number of viral copies(<138 copies/mL). A negative result must be combined with clinical observations, patient history, and epidemiological information. The expected result is Negative.  Fact Sheet for Patients:  EntrepreneurPulse.com.au  Fact Sheet for Healthcare Providers:  IncredibleEmployment.be  This test is no t yet approved or cleared by the [place] FDA and  has been authorized for detection and/or diagnosis of SARS-CoV-2 by FDA under an Emergency Use Authorization (EUA). This EUA will remain  in effect (meaning this test can be used) for the duration of the COVID-19 declaration under Section 564(b)(1) of the Act, 21 U.S.C.section 360bbb-3(b)(1), unless the authorization is terminated  or revoked sooner.       Influenza A by PCR NEGATIVE NEGATIVE Final   Influenza B by PCR NEGATIVE NEGATIVE Final    Comment: (NOTE) The Xpert Xpress SARS-CoV-2/FLU/RSV plus assay is intended as an aid in the diagnosis of influenza from Nasopharyngeal swab specimens and should not be used as a sole basis for treatment. Nasal washings and aspirates are unacceptable for Xpert Xpress SARS-CoV-2/FLU/RSV testing.  Fact Sheet for Patients: EntrepreneurPulse.com.au  Fact Sheet for Healthcare Providers: IncredibleEmployment.be  This test is not yet approved or cleared by the [place] FDA and has been authorized for detection and/or diagnosis of SARS-CoV-2 by FDA under an Emergency Use Authorization (EUA). This EUA will remain in effect (meaning this test can be used) for the duration of the COVID-19 declaration under Section 564(b)(1) of the Act, 21 U.S.C. section 360bbb-3(b)(1), unless the authorization is terminated or revoked.  Performed at [facility], [address], [place], [place] [address]   Body  fluid culture w Gram Stain     Status: None   Collection Time: 04/25/21 12:10 PM   Specimen: PATH Cytology Peritoneal fluid  Result Value Ref Range Status   Specimen Description   Final    PERITONEAL Performed at [facility], [address], [place], [place] [address]    Special Requests   Final    NONE Performed at [facility], [address] [place], [place] [address]    Gram Stain   Final    MODERATE WBC PRESENT,BOTH PMN AND MONONUCLEAR NO ORGANISMS SEEN    Culture   Final    NO GROWTH 3 DAYS Performed at [facility], [place] [address] [place], [place] [address]    Report Status 04/29/2021 FINAL  Final  MRSA PCR Screening     Status: None   Collection Time: 04/25/21  3:58 PM   Specimen: Nasopharyngeal  Result Value Ref Range Status   MRSA by PCR NEGATIVE NEGATIVE Final    Comment:        The GeneXpert MRSA Assay (FDA approved for NASAL specimens only), is one component of a comprehensive MRSA colonization surveillance program. It is not intended to diagnose MRSA infection nor to guide or monitor treatment for MRSA infections. Performed at [facility], [address] [place], [place] [address]      Labs: BNP (last 3 results) No results for input(s): BNP in the last 8760 hours. Basic Metabolic Panel: Recent Labs  Lab 04/25/21 0330 04/26/21 0519 04/27/21 0907 04/28/21 1048 04/29/21 1133 04/30/21 1031 04/30/21 1329  NA 136 140 139 138 139  --   --  K 5.9* 5.2* 5.7* 5.7* 5.3*  --   --   CL 106 112* 111 110 109  --   --   CO2 20* 20* 18* 19* 19*  --   --   GLUCOSE 141* 137* 193* 211* 270* 519* 396*  BUN 66* 56* 49* 59* 67*  --   --   CREATININE 3.00* 2.19* 2.18* 2.17* 1.79*  --   --   CALCIUM 9.2 8.9 9.2 9.3 9.4  --   --   MG  --   --   --  1.9  --   --   --   PHOS  --   --  3.4  --   --   --   --    Liver Function Tests: Recent Labs  Lab 04/24/21 1943 04/27/21 0907  AST 42*  --   ALT 14  --   ALKPHOS 344*   --   BILITOT 1.1  --   PROT 8.2*  --   ALBUMIN 2.9* 2.3*   Recent Labs  Lab 04/24/21 1943  LIPASE 49   No results for input(s): AMMONIA in the last 168 hours. CBC: Recent Labs  Lab 04/24/21 1943 04/25/21 0330 04/26/21 0519 04/27/21 0907 04/29/21 1133  WBC 13.5* 13.5* 13.0* 17.6* 15.3*  NEUTROABS 11.5*  --  10.9*  --  14.0*  HGB 10.5* 10.8* 9.8* 10.3* 11.0*  HCT 32.4* 33.9* 30.6* 32.1* 33.4*  MCV 86.9 87.6 87.9 86.8 85.6  PLT 317 321 290 325 315   Cardiac Enzymes: No results for input(s): CKTOTAL, CKMB, CKMBINDEX, TROPONINI in the last 168 hours. BNP: Invalid input(s): POCBNP CBG: Recent Labs  Lab 04/29/21 1133 04/29/21 1633 04/29/21 2150 04/30/21 0837 04/30/21 1224  GLUCAP 239* 389* 332* 402* 437*   D-Dimer No results for input(s): DDIMER in the last 72 hours. Hgb A1c No results for input(s): HGBA1C in the last 72 hours. Lipid Profile No results for input(s): CHOL, HDL, LDLCALC, TRIG, CHOLHDL, LDLDIRECT in the last 72 hours. Thyroid function studies No results for input(s): TSH, T4TOTAL, T3FREE, THYROIDAB in the last 72 hours.  Invalid input(s): FREET3 Anemia work up No results for input(s): VITAMINB12, FOLATE, FERRITIN, TIBC, IRON, RETICCTPCT in the last 72 hours. Urinalysis    Component Value Date/Time   COLORURINE YELLOW (A) 04/25/2021 0330   APPEARANCEUR CLEAR (A) 04/25/2021 0330   LABSPEC 1.015 04/25/2021 0330   PHURINE 5.0 04/25/2021 0330   GLUCOSEU NEGATIVE 04/25/2021 0330   HGBUR SMALL (A) 04/25/2021 0330   BILIRUBINUR NEGATIVE 04/25/2021 0330   KETONESUR 5 (A) 04/25/2021 0330   PROTEINUR NEGATIVE 04/25/2021 0330   NITRITE NEGATIVE 04/25/2021 0330   LEUKOCYTESUR NEGATIVE 04/25/2021 0330   Sepsis Labs Invalid input(s): PROCALCITONIN,  WBC,  LACTICIDVEN Microbiology Recent Results (from the past 240 hour(s))  Resp Panel by RT-PCR (Flu A&B, Covid) Nasopharyngeal Swab     Status: None   Collection Time: 04/25/21 12:45 AM   Specimen:  Nasopharyngeal Swab; Nasopharyngeal(NP) swabs in vial transport medium  Result Value Ref Range Status   SARS Coronavirus 2 by RT PCR NEGATIVE NEGATIVE Final    Comment: (NOTE) SARS-CoV-2 target nucleic acids are NOT DETECTED.  The SARS-CoV-2 RNA is generally detectable in upper respiratory specimens during the acute phase of infection. The lowest concentration of SARS-CoV-2 viral copies this assay can detect is 138 copies/mL. A negative result does not preclude SARS-Cov-2 infection and should not be used as the sole basis for treatment or other patient management decisions. A negative  result may occur with  improper specimen collection/handling, submission of specimen other than nasopharyngeal swab, presence of viral mutation(s) within the areas targeted by this assay, and inadequate number of viral copies(<138 copies/mL). A negative result must be combined with clinical observations, patient history, and epidemiological information. The expected result is Negative.  Fact Sheet for Patients:  EntrepreneurPulse.com.au  Fact Sheet for Healthcare Providers:  IncredibleEmployment.be  This test is no t yet approved or cleared by the [place] FDA and  has been authorized for detection and/or diagnosis of SARS-CoV-2 by FDA under an Emergency Use Authorization (EUA). This EUA will remain  in effect (meaning this test can be used) for the duration of the COVID-19 declaration under Section 564(b)(1) of the Act, 21 U.S.C.section 360bbb-3(b)(1), unless the authorization is terminated  or revoked sooner.       Influenza A by PCR NEGATIVE NEGATIVE Final   Influenza B by PCR NEGATIVE NEGATIVE Final    Comment: (NOTE) The Xpert Xpress SARS-CoV-2/FLU/RSV plus assay is intended as an aid in the diagnosis of influenza from Nasopharyngeal swab specimens and should not be used as a sole basis for treatment. Nasal washings and aspirates are unacceptable for  Xpert Xpress SARS-CoV-2/FLU/RSV testing.  Fact Sheet for Patients: EntrepreneurPulse.com.au  Fact Sheet for Healthcare Providers: IncredibleEmployment.be  This test is not yet approved or cleared by the [place] FDA and has been authorized for detection and/or diagnosis of SARS-CoV-2 by FDA under an Emergency Use Authorization (EUA). This EUA will remain in effect (meaning this test can be used) for the duration of the COVID-19 declaration under Section 564(b)(1) of the Act, 21 U.S.C. section 360bbb-3(b)(1), unless the authorization is terminated or revoked.  Performed at [facility], [address] [place], [place] [address]   Body fluid culture w Gram Stain     Status: None   Collection Time: 04/25/21 12:10 PM   Specimen: PATH Cytology Peritoneal fluid  Result Value Ref Range Status   Specimen Description   Final    PERITONEAL Performed at [facility], [address] [place], [place] [address]    Special Requests   Final    NONE Performed at [facility], [address] [place], [place] [address]    Gram Stain   Final    MODERATE WBC PRESENT,BOTH PMN AND MONONUCLEAR NO ORGANISMS SEEN    Culture   Final    NO GROWTH 3 DAYS Performed at [facility], [place] [address] [place], [place] [address]    Report Status 04/29/2021 FINAL  Final  MRSA PCR Screening     Status: None   Collection Time: 04/25/21  3:58 PM   Specimen: Nasopharyngeal  Result Value Ref Range Status   MRSA by PCR NEGATIVE NEGATIVE Final    Comment:        The GeneXpert MRSA Assay (FDA approved for NASAL specimens only), is one component of a comprehensive MRSA colonization surveillance program. It is not intended to diagnose MRSA infection nor to guide or monitor treatment for MRSA infections. Performed at [facility], [address], [place], [place] [address]      Time coordinating discharge: Over 30  minutes  SIGNED:   Sidney Ace, MD  [facility] 04/30/2021, 3:14 PM Pager   If 7PM-7AM, please contact night-coverage

## 2021-04-30 NOTE — Progress Notes (Signed)
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  A diagnostic and therapeutic paracentesis was performed to potentially confirm Stage IV carcinosarcoma, however cytology ultimately returned atypical, but negative for diagnosis.  In light of progressive medical and physical decline current plans are to d/c back to ALF with hospice.  Discussed with Dr. Priscella Mann, biopsy not needed at this time. Will cancel IR order.   Brynda Greathouse, MS RD PA-C 8:59 AM

## 2021-04-30 NOTE — Progress Notes (Signed)
Physical Therapy Treatment Patient Details Name: [name] MRN: [id number] DOB: Oct 07, 1951 Today's Date: 04/30/2021    History of Present Illness Pt admitted for hyperkalemia. 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 Currently complains of severe stomach pain.    PT Comments    Patient agreeable to PT. Patient participated with LE strengthening exercises in bed. Patient continues to require assistance for bed mobility. Min A- Mod A required to maintain sitting balance with posterior lean. Limited overall sitting tolerance and fatigue with minimal activity. Sp02 98-99% on room air with activity. Recommend to continue PT to maximize independence and decrease caregiver burden. SNF recommended at discharge.     Follow Up Recommendations  SNF     Equipment Recommendations  None recommended by PT (to be determined at next level of care)    Recommendations for Other Services       Precautions / Restrictions Precautions Precautions: Fall Restrictions Weight Bearing Restrictions: No    Mobility  Bed Mobility Overal bed mobility: Needs Assistance Bed Mobility: Supine to Sit;Sit to Supine     Supine to sit: Max assist;HOB elevated Sit to supine: Max assist   General bed mobility comments: assistance for LE and trunk support. verbal cues for sequencing and task initiation. increased time required to complete tasks    Transfers                 General transfer comment: unable to safely attempt due to poor sitting tolerance and generalized weakness  Ambulation/Gait                 Stairs             Wheelchair Mobility    Modified Rankin (Stroke Patients Only)       Balance Overall balance assessment: Needs assistance Sitting-balance support: Feet supported;Bilateral upper extremity supported Sitting balance-Leahy Scale: Poor Sitting balance - Comments: Min A- Mod A with facilitation to  maintain midline sitting balance Postural control: Posterior lean                                  Cognition Arousal/Alertness: Awake/alert Behavior During Therapy: WFL for tasks assessed/performed Overall Cognitive Status: No family/caregiver present to determine baseline cognitive functioning                                 General Comments: patient able to follow single step commands with extra time      Exercises General Exercises - Lower Extremity Ankle Circles/Pumps: AAROM;Strengthening;Both;Supine;10 reps Heel Slides: AAROM;Strengthening;Both;Supine;10 reps Hip ABduction/ADduction: AAROM;Strengthening;Both;Supine;10 reps Straight Leg Raises: AAROM;Strengthening;Both;10 reps;Supine Other Exercises Other Exercises: verbal cues for exercise technqiue for strengthening of BLE. Sp02 99% on room air with activity    General Comments        Pertinent Vitals/Pain Pain Assessment: No/denies pain    Home Living                      Prior Function            PT Goals (current goals can now be found in the care plan section) Acute Rehab PT Goals Patient Stated Goal: to get stronger PT Goal Formulation: With patient Time For Goal Achievement: 05/10/21 Potential to Achieve Goals: Good Progress towards PT goals: Progressing toward goals    Frequency  Min 2X/week      PT Plan Current plan remains appropriate    Co-evaluation              AM-PAC PT "6 Clicks" Mobility   Outcome Measure  Help needed turning from your back to your side while in a flat bed without using bedrails?: A Lot Help needed moving from lying on your back to sitting on the side of a flat bed without using bedrails?: A Lot Help needed moving to and from a bed to a chair (including a wheelchair)?: Total Help needed standing up from a chair using your arms (e.g., wheelchair or bedside chair)?: Total Help needed to walk in hospital room?: Total Help  needed climbing 3-5 steps with a railing? : Total 6 Click Score: 8    End of Session   Activity Tolerance: Patient limited by fatigue Patient left: in bed;with call bell/phone within reach;with bed alarm set Nurse Communication: Mobility status PT Visit Diagnosis: Unsteadiness on feet (R26.81);Muscle weakness (generalized) (M62.81);Difficulty in walking, not elsewhere classified (R26.2)     Time: 0174-9449 PT Time Calculation (min) (ACUTE ONLY): 23 min  Charges:  $Therapeutic Exercise: 8-22 mins $Therapeutic Activity: 8-22 mins                     Minna Merritts, PT, MPT    Percell Locus 04/30/2021, 2:39 PM

## 2021-04-30 NOTE — Progress Notes (Signed)
Paged Dr. Priscella Mann re:pt's blood sugar. Orders received.

## 2021-04-30 NOTE — Care Management Important Message (Signed)
Important Message  Patient Details  Name: [name] MRN: [id number] Date of Birth: 1951/09/20   Medicare Important Message Given:  Other (see comment)  Patient is returning to previous [place] with Hospice with plans to transfer to [place] when a bed is available.  Out of respect for the patient and family no Important Message from [facility] given.  [place] 04/30/2021, 1:22 PM

## 2021-04-30 NOTE — NC FL2 (Signed)
[place] LEVEL OF CARE SCREENING TOOL     IDENTIFICATION  Patient Name: [name] Birthdate: [birth year] Sex: female Admission Date (Current Location): 04/24/2021  County and [place] Number:  Engineering geologist and Address:  [facility], [address], [place], [place] [address]      Provider Number: [id number]  Attending Physician Name and Address:  Sidney Ace, MD  Relative Name and Phone Number:  [place], [place] Relative     [phone]    Current Level of Care: Hospital Recommended Level of Care: [place] Prior Approval Number:    Date Approved/Denied:   PASRR Number:    Discharge Plan:      Current Diagnoses: Patient Active Problem List   Diagnosis Date Noted  . Hyperkalemia 04/25/2021  . Hydronephrosis 04/25/2021  . Abdominal pain 04/25/2021  . Constipation 04/18/2021  . Gastro-esophageal reflux disease without esophagitis 04/18/2021  . Hyperglycemia due to type 2 diabetes mellitus ([place]) 04/18/2021  . Endometrial cancer ([place]) 07/13/2020  . Carcinosarcoma of endometrium ([place]) 06/22/2020  . Palliative care encounter 01/06/2019  . Localized edema 01/06/2019  . Shortness of breath 01/06/2019  . Hypertension 08/25/2018  . Cellulitis of lower extremity 04/06/2018  . Mild protein-calorie malnutrition ([place]) 04/06/2018  . Pressure injury of skin 03/27/2018  . Sepsis ([place]) 03/26/2018  . Diabetic infection of left foot ([place]) 03/26/2018  . AKI (acute kidney injury) ([place]) 03/26/2018  . Venous ulcer of left leg ([place]) 11/18/2016  . Chronic venous insufficiency 11/18/2016  . Lymphedema 11/18/2016  . Swelling of limb 11/18/2016  . Type 2 diabetes mellitus with insulin therapy ([place]) 11/18/2016  . Hyperlipidemia 11/18/2016    Orientation RESPIRATION BLADDER Height & Weight     Self  Normal Incontinent,External catheter Weight: 207 lb (93.9 kg) Height:  5\' 2"  (157.5 cm)  BEHAVIORAL SYMPTOMS/MOOD  NEUROLOGICAL BOWEL NUTRITION STATUS      Incontinent Diet (carb modified, thin liquids, soft foods)  AMBULATORY STATUS COMMUNICATION OF NEEDS Skin   Total Care Verbally Other (Comment) (dry skin)                       Personal Care Assistance Level of Assistance  Bathing,Feeding,Dressing Bathing Assistance: Maximum assistance Feeding assistance: Maximum assistance Dressing Assistance: Maximum assistance     Functional Limitations Info             SPECIAL CARE FACTORS FREQUENCY                       Contractures      Additional Factors Info  Code Status,Allergies Code Status Info: DNR Allergies Info: penicillins           Discharge Medications: Medication List    STOP taking these medications   atorvastatin 20 MG tablet Commonly known as: LIPITOR   ferrous sulfate 325 (65 FE) MG tablet   glipiZIDE 10 MG tablet Commonly known as: GLUCOTROL   levothyroxine 125 MCG tablet Commonly known as: SYNTHROID   ondansetron 4 MG tablet Commonly known as: ZOFRAN   pantoprazole 40 MG tablet Commonly known as: PROTONIX   sennosides-docusate sodium 8.6-50 MG tablet Commonly known as: SENOKOT-S   Zinc Sulfate 220 (50 Zn) MG Tabs     TAKE these medications   acetaminophen 325 MG tablet Commonly known as: TYLENOL Take 650 mg by mouth every 4 (four) hours as needed for mild pain or moderate pain.   alum & mag hydroxide-simeth  200-200-20 MG/5ML suspension Commonly known as: MAALOX/MYLANTA Take 30 mLs by mouth 4 (four) times daily as needed for indigestion or heartburn.   dexamethasone 2 MG tablet Commonly known as: DECADRON Take 1 tablet (2 mg total) by mouth every 12 (twelve) hours.   diphenhydrAMINE 25 MG tablet Commonly known as: BENADRYL Take 25 mg by mouth every 6 (six) hours as needed for allergies.   dronabinol 5 MG capsule Commonly known as: MARINOL Take 1 capsule (5 mg total) by mouth 2 (two) times daily before lunch and  supper for 3 days.   eucerin cream Apply 1 application topically 2 (two) times daily. (apply to feet and legs)   fentaNYL 12 MCG/HR Commonly known as: [place] 1 patch onto the skin every 3 (three) days.   fluticasone 50 MCG/ACT nasal spray Commonly known as: FLONASE Place 2 sprays into both nostrils daily as needed for allergies.   gabapentin 300 MG capsule Commonly known as: NEURONTIN Take 300 mg by mouth 2 (two) times daily.   guaifenesin 100 MG/5ML syrup Commonly known as: ROBITUSSIN Take 300 mg by mouth every 6 (six) hours as needed for cough.   HYDROcodone-acetaminophen 5-325 MG tablet Commonly known as: NORCO/VICODIN Take 1 tablet by mouth every 6 (six) hours as needed for up to 3 days for moderate pain.   hydrOXYzine 25 MG capsule Commonly known as: VISTARIL Take 25 mg by mouth every 8 (eight) hours as needed for anxiety.   loperamide 2 MG tablet Commonly known as: IMODIUM A-D Take 2 mg by mouth as needed for diarrhea or loose stools. (max 8 doses in 24 hours)   magnesium hydroxide 400 MG/5ML suspension Commonly known as: MILK OF MAGNESIA Take 30 mLs by mouth daily as needed for mild constipation or moderate constipation.   ondansetron 4 MG disintegrating tablet Commonly known as: Zofran ODT Take 1 tablet (4 mg total) by mouth every 8 (eight) hours as needed for nausea or vomiting.   tamsulosin 0.4 MG Caps capsule Commonly known as: FLOMAX Take 1 capsule (0.4 mg total) by mouth daily.       Please see discharge summary for a list of discharge medications.  Relevant Imaging Results:  Relevant Lab Results:   Additional Information SS #: [id number]  [place], LCSW

## 2021-04-30 NOTE — TOC Progression Note (Addendum)
Transition of Care [facility]) - Progression Note    Patient Details  Name: [name] MRN: [id number] Date of Birth: December 01, 1951  Transition of Care [facility]) CM/SW [place], LCSW Phone Number: 04/30/2021, 10:58 AM  Clinical Narrative:   Called [facility] and spoke to [place]. She reported they can take patient back to ALF with hospice, they use [facility]. Updated relative [employer] via phone. She is in agreement with this plan, she would like patient to go to hospice home when appropriate. Ometta asked if she can get a hospital bed and private room at ALF. Referral made to [employer]. Kieth Brightly will work on getting hospital bed at [place]. Reached out to [facility] about private room and any other DME needs. She said patient has a RW and she thinks patient should be able to walk to bathroom so wouldn't need a BSC. Informed her per cousin patient has not been walking since being here, confirmed with RN patient is now bedbound. Natividad Brood is checking with her supervisor Rachel Bo to see if they can take patient at this state.  12:15- Called [facility] again. Spoke to [employer]. She stated Rachel Bo said it is fine for patient to come back to ALF with hospice even though she cannot walk. Updated care team. Per Kieth Brightly with [facility], they will order hospital bed and bedside table and let us know when it is delivered (today or tomorrow). Per MD,  patient can be discharged once this is delivered. Updated Natividad Brood via phone, she said they will place patient in a private room- room 212. Updated patient's family member [place].  12:30- Call from [place]. He verbalized agreement with the same plan. He said he just needs updated FL2 with med changes in DC packet when patient leaves.    Expected Discharge Plan: Assisted Living Barriers to Discharge: Continued Medical Work up  Expected Discharge Plan and Services Expected Discharge Plan: Assisted Living   Discharge Planning Services: CM Consult    Living arrangements for the past 2 months: Assisted Living Facility                                       Social Determinants of Health (SDOH) Interventions    Readmission Risk Interventions No flowsheet data found.

## 2021-05-02 ENCOUNTER — Inpatient Hospital Stay: Payer: Medicare Other | Admitting: Hospice and Palliative Medicine

## 2021-05-02 ENCOUNTER — Inpatient Hospital Stay: Payer: Medicare Other | Admitting: Oncology

## 2021-05-10 NOTE — ED Provider Notes (Signed)
[facility] Emergency Department Provider Note   ____________________________________________   Event Date/Time   First MD Initiated Contact with Patient 04/25/21 [phone]     (approximate)  I have reviewed the triage vital signs and the nursing notes.   HISTORY  Chief Complaint Abdominal Pain    HPI [name] is a 70 y.o. female with below stated past medical history who presents via long-term living facility with complaints of left-sided abdominal pain that has waxed and waned over the past month.  States that this pain is the worst it has ever been tonight with 8/10, left upper and lower quadrant abdominal pain that does not radiate and is associated with mild nausea and intermittent vomiting.  Patient is very somnolent and therefore further history and review of systems are unable to obtain at this time         Past Medical History:  Diagnosis Date  . Adult failure to thrive   . Aortic atherosclerosis ([place])   . Asthma   . 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 Cellulitis of left foot   . Cellulitis of right foot   . Diabetes mellitus without complication ([place])   . Glaucoma   . HLD (hyperlipidemia)   . Hypertension   . Protein calorie malnutrition ([place])   . Stage 2 skin ulcer of sacral region ([place])   . Thyroid disease    hypothyroid  . Urinary retention     Patient Active Problem List   Diagnosis Date Noted  . Hyperkalemia 04/25/2021  . Hydronephrosis 04/25/2021  . Abdominal pain 04/25/2021  . Constipation 04/18/2021  . Gastro-esophageal reflux disease without esophagitis 04/18/2021  . Hyperglycemia due to type 2 diabetes mellitus ([place]) 04/18/2021  . Endometrial cancer ([place]) 07/13/2020  . 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 Palliative care encounter 01/06/2019  . Localized edema 01/06/2019  . Shortness of breath 01/06/2019  . Hypertension 08/25/2018  . Cellulitis of lower extremity 04/06/2018  . Mild  protein-calorie malnutrition ([place]) 04/06/2018  . Pressure injury of skin 03/27/2018  . Sepsis ([place]) 03/26/2018  . Diabetic infection of left foot ([place]) 03/26/2018  . AKI (acute kidney injury) ([place]) 03/26/2018  . Venous ulcer of left leg ([place]) 11/18/2016  . Chronic venous insufficiency 11/18/2016  . Lymphedema 11/18/2016  . Swelling of limb 11/18/2016  . Type 2 diabetes mellitus with insulin therapy ([place]) 11/18/2016  . Hyperlipidemia 11/18/2016    Past Surgical History:  Procedure Laterality Date  . APPENDECTOMY    . LOWER EXTREMITY ANGIOGRAPHY Left 01/04/2019   Procedure: LOWER EXTREMITY ANGIOGRAPHY;  Surgeon: Algernon Huxley, MD;  Location: [facility];  Service: Cardiovascular;  Laterality: Left;    Prior to Admission medications   Medication Sig Start Date End Date Taking? Authorizing Provider  acetaminophen (TYLENOL) 325 MG tablet Take 650 mg by mouth every 4 (four) hours as needed for mild pain or moderate pain.   Yes [provider]  alum & mag hydroxide-simeth (MAALOX/MYLANTA) 200-200-20 MG/5ML suspension Take 30 mLs by mouth 4 (four) times daily as needed for indigestion or heartburn.   Yes [provider]  diphenhydrAMINE (BENADRYL) 25 MG tablet Take 25 mg by mouth every 6 (six) hours as needed for allergies.   Yes [provider]  fluticasone (FLONASE) 50 MCG/ACT nasal spray Place 2 sprays into both nostrils daily as needed for allergies.   Yes [provider]  gabapentin (NEURONTIN) 300 MG capsule Take 300 mg by mouth 2 (two)  times daily.   Yes [provider]  guaifenesin (ROBITUSSIN) 100 MG/5ML syrup Take 300 mg by mouth every 6 (six) hours as needed for cough.   Yes [provider]  hydrOXYzine (VISTARIL) 25 MG capsule Take 25 mg by mouth every 8 (eight) hours as needed for anxiety.   Yes [provider]  loperamide (IMODIUM A-D) 2 MG tablet Take 2 mg by mouth as needed for diarrhea or loose stools. (max 8  doses in 24 hours)   Yes [provider]  magnesium hydroxide (MILK OF MAGNESIA) 400 MG/5ML suspension Take 30 mLs by mouth daily as needed for mild constipation or moderate constipation.   Yes [provider]  Skin Protectants, Misc. (EUCERIN) cream Apply 1 application topically 2 (two) times daily. (apply to feet and legs)   Yes [provider]  tamsulosin (FLOMAX) 0.4 MG CAPS capsule Take 1 capsule (0.4 mg total) by mouth daily. 04/03/18  Yes Mody, Ulice Bold, MD  dexamethasone (DECADRON) 2 MG tablet Take 1 tablet (2 mg total) by mouth every 12 (twelve) hours. 04/30/21   Sidney Ace, MD  fentaNYL (DURAGESIC) 12 MCG/HR Place 1 patch onto the skin every 3 (three) days. 04/30/21   Sreenath, Trula Slade, MD  ondansetron (ZOFRAN ODT) 4 MG disintegrating tablet Take 1 tablet (4 mg total) by mouth every 8 (eight) hours as needed for nausea or vomiting. Patient not taking: No sig reported 04/06/21   Blake Divine, MD    Allergies Penicillins  Family History  Problem Relation Age of Onset  . Diabetes Mother   . Diabetes Father   . Alzheimer's disease Father   . Breast cancer Neg Hx     Social History Social History   Tobacco Use  . Smoking status: Former Research scientist (life sciences)  . Smokeless tobacco: Never Used  Vaping Use  . Vaping Use: Never used  Substance Use Topics  . Alcohol use: No  . Drug use: No    Review of Systems Unable to assess ____________________________________________   PHYSICAL EXAM:  VITAL SIGNS: ED Triage Vitals  Enc Vitals Group     BP 04/24/21 1847 (!) 122/53     Pulse Rate 04/24/21 1847 95     Resp 04/24/21 1847 15     Temp 04/24/21 1847 98.1 F (36.7 C)     Temp Source 04/24/21 1847 Oral     SpO2 04/24/21 1847 98 %     Weight 04/24/21 1846 207 lb (93.9 kg)     Height 04/24/21 1846 5\' 2"  (1.575 m)     Head Circumference --      Peak Flow --      Pain Score 04/24/21 1846 8     Pain Loc --      Pain Edu? --      Excl. in [place]? --     Constitutional: Alert and oriented. Well appearing and in no acute distress. Eyes: Conjunctivae are normal. PERRL. Head: Atraumatic. Nose: No congestion/rhinnorhea. Mouth/Throat: Mucous membranes are moist. Neck: No stridor Cardiovascular: Grossly normal heart sounds.  Good peripheral circulation. Respiratory: Normal respiratory effort.  No retractions. Gastrointestinal: Soft and no response to deep palpation and any abdominal quadrant. No distention. Musculoskeletal: No obvious deformities Neurologic:  Normal speech and language. No gross focal neurologic deficits are appreciated. Skin:  Skin is warm and dry. No rash noted. Psychiatric: Mood and affect are normal. Speech and behavior are normal.  ____________________________________________   LABS (all labs ordered are listed, but only abnormal results are  displayed)  Labs Reviewed  COMPREHENSIVE METABOLIC PANEL - Abnormal; Notable for the following components:      Result Value   Potassium 6.2 (*)    CO2 21 (*)    Glucose, Bld 149 (*)    BUN 71 (*)    Creatinine, Ser 3.04 (*)    Total Protein 8.2 (*)    Albumin 2.9 (*)    AST 42 (*)    Alkaline Phosphatase 344 (*)    GFR, Estimated 16 (*)    All other components within normal limits  CBC WITH DIFFERENTIAL/PLATELET - Abnormal; Notable for the following components:   WBC 13.5 (*)    RBC 3.73 (*)    Hemoglobin 10.5 (*)    HCT 32.4 (*)    RDW 16.8 (*)    Neutro Abs 11.5 (*)    Monocytes Absolute 1.1 (*)    Abs Immature Granulocytes 0.12 (*)    All other components within normal limits  URINALYSIS, COMPLETE (UACMP) WITH MICROSCOPIC - Abnormal; Notable for the following components:   Color, Urine YELLOW (*)    APPearance CLEAR (*)    Hgb urine dipstick SMALL (*)    Ketones, ur 5 (*)    Bacteria, UA RARE (*)    All other components within normal limits  BASIC METABOLIC PANEL - Abnormal; Notable for the following components:   Potassium 5.9 (*)    CO2 20 (*)     Glucose, Bld 141 (*)    BUN 66 (*)    Creatinine, Ser 3.00 (*)    GFR, Estimated 16 (*)    All other components within normal limits  CBC - Abnormal; Notable for the following components:   WBC 13.5 (*)    Hemoglobin 10.8 (*)    HCT 33.9 (*)    RDW 16.9 (*)    All other components within normal limits  HEMOGLOBIN A1C - Abnormal; Notable for the following components:   Hgb A1c MFr Bld 7.6 (*)    All other components within normal limits  BODY FLUID CELL COUNT WITH DIFFERENTIAL - Abnormal; Notable for the following components:   Color, Fluid RED (*)    Appearance, Fluid BLOODY (*)    All other components within normal limits  GLUCOSE, CAPILLARY - Abnormal; Notable for the following components:   Glucose-Capillary 134 (*)    All other components within normal limits  GLUCOSE, CAPILLARY - Abnormal; Notable for the following components:   Glucose-Capillary 106 (*)    All other components within normal limits  CBC WITH DIFFERENTIAL/PLATELET - Abnormal; Notable for the following components:   WBC 13.0 (*)    RBC 3.48 (*)    Hemoglobin 9.8 (*)    HCT 30.6 (*)    RDW 17.3 (*)    Neutro Abs 10.9 (*)    Monocytes Absolute 1.1 (*)    Abs Immature Granulocytes 0.21 (*)    All other components within normal limits  GLUCOSE, CAPILLARY - Abnormal; Notable for the following components:   Glucose-Capillary 139 (*)    All other components within normal limits  BASIC METABOLIC PANEL - Abnormal; Notable for the following components:   Potassium 5.2 (*)    Chloride 112 (*)    CO2 20 (*)    Glucose, Bld 137 (*)    BUN 56 (*)    Creatinine, Ser 2.19 (*)    GFR, Estimated 24 (*)    All other components within normal limits  GLUCOSE, CAPILLARY - Abnormal; Notable for the  following components:   Glucose-Capillary 149 (*)    All other components within normal limits  GLUCOSE, CAPILLARY - Abnormal; Notable for the following components:   Glucose-Capillary 197 (*)    All other components within  normal limits  GLUCOSE, CAPILLARY - Abnormal; Notable for the following components:   Glucose-Capillary 161 (*)    All other components within normal limits  GLUCOSE, CAPILLARY - Abnormal; Notable for the following components:   Glucose-Capillary 175 (*)    All other components within normal limits  GLUCOSE, CAPILLARY - Abnormal; Notable for the following components:   Glucose-Capillary 168 (*)    All other components within normal limits  RENAL FUNCTION PANEL - Abnormal; Notable for the following components:   Potassium 5.7 (*)    CO2 18 (*)    Glucose, Bld 193 (*)    BUN 49 (*)    Creatinine, Ser 2.18 (*)    Albumin 2.3 (*)    GFR, Estimated 24 (*)    All other components within normal limits  CBC - Abnormal; Notable for the following components:   WBC 17.6 (*)    RBC 3.70 (*)    Hemoglobin 10.3 (*)    HCT 32.1 (*)    RDW 17.3 (*)    All other components within normal limits  GLUCOSE, CAPILLARY - Abnormal; Notable for the following components:   Glucose-Capillary 187 (*)    All other components within normal limits  GLUCOSE, CAPILLARY - Abnormal; Notable for the following components:   Glucose-Capillary 191 (*)    All other components within normal limits  GLUCOSE, CAPILLARY - Abnormal; Notable for the following components:   Glucose-Capillary 198 (*)    All other components within normal limits  GLUCOSE, CAPILLARY - Abnormal; Notable for the following components:   Glucose-Capillary 202 (*)    All other components within normal limits  BASIC METABOLIC PANEL - Abnormal; Notable for the following components:   Potassium 5.7 (*)    CO2 19 (*)    Glucose, Bld 211 (*)    BUN 59 (*)    Creatinine, Ser 2.17 (*)    GFR, Estimated 24 (*)    All other components within normal limits  GLUCOSE, CAPILLARY - Abnormal; Notable for the following components:   Glucose-Capillary 191 (*)    All other components within normal limits  GLUCOSE, CAPILLARY - Abnormal; Notable for the  following components:   Glucose-Capillary 199 (*)    All other components within normal limits  GLUCOSE, CAPILLARY - Abnormal; Notable for the following components:   Glucose-Capillary 193 (*)    All other components within normal limits  GLUCOSE, CAPILLARY - Abnormal; Notable for the following components:   Glucose-Capillary 251 (*)    All other components within normal limits  GLUCOSE, CAPILLARY - Abnormal; Notable for the following components:   Glucose-Capillary 256 (*)    All other components within normal limits  CBC WITH DIFFERENTIAL/PLATELET - Abnormal; Notable for the following components:   WBC 15.3 (*)    Hemoglobin 11.0 (*)    HCT 33.4 (*)    RDW 17.4 (*)    nRBC 0.3 (*)    Neutro Abs 14.0 (*)    Lymphs Abs 0.4 (*)    Abs Immature Granulocytes 0.35 (*)    All other components within normal limits  BASIC METABOLIC PANEL - Abnormal; Notable for the following components:   Potassium 5.3 (*)    CO2 19 (*)    Glucose, Bld 270 (*)  BUN 67 (*)    Creatinine, Ser 1.79 (*)    GFR, Estimated 30 (*)    All other components within normal limits  GLUCOSE, CAPILLARY - Abnormal; Notable for the following components:   Glucose-Capillary 239 (*)    All other components within normal limits  GLUCOSE, CAPILLARY - Abnormal; Notable for the following components:   Glucose-Capillary 389 (*)    All other components within normal limits  GLUCOSE, CAPILLARY - Abnormal; Notable for the following components:   Glucose-Capillary 332 (*)    All other components within normal limits  GLUCOSE, CAPILLARY - Abnormal; Notable for the following components:   Glucose-Capillary 402 (*)    All other components within normal limits  GLUCOSE, RANDOM - Abnormal; Notable for the following components:   Glucose, Bld 519 (*)    All other components within normal limits  GLUCOSE, CAPILLARY - Abnormal; Notable for the following components:   Glucose-Capillary 437 (*)    All other components within  normal limits  GLUCOSE, RANDOM - Abnormal; Notable for the following components:   Glucose, Bld 396 (*)    All other components within normal limits  GLUCOSE, CAPILLARY - Abnormal; Notable for the following components:   Glucose-Capillary 279 (*)    All other components within normal limits  CBG MONITORING, ED - Abnormal; Notable for the following components:   Glucose-Capillary 135 (*)    All other components within normal limits  CBG MONITORING, ED - Abnormal; Notable for the following components:   Glucose-Capillary 134 (*)    All other components within normal limits  RESP PANEL BY RT-PCR (FLU A&B, COVID) ARPGX2  BODY FLUID CULTURE W GRAM STAIN  MRSA PCR SCREENING  LIPASE, BLOOD  LACTIC ACID, PLASMA  HIV ANTIBODY (ROUTINE TESTING W REFLEX)  PROTIME-INR  GLUCOSE, CAPILLARY  MAGNESIUM  CYTOLOGY - NON PAP   RADIOLOGY  ED MD interpretation: Pending  Official radiology report(s): No results found.  ____________________________________________   PROCEDURES  Procedure(s) performed (including Critical Care):  Procedures   ____________________________________________   INITIAL IMPRESSION / ASSESSMENT AND PLAN / ED COURSE  As part of my medical decision making, I reviewed the following data within the [place] notes reviewed and incorporated, Labs reviewed, EKG interpreted, Old chart reviewed, Radiograph reviewed and Notes from prior ED visits reviewed and incorporated        Patients symptoms not typical for emergent causes of abdominal pain such as, but not limited to, appendicitis, abdominal aortic aneurysm, surgical biliary disease, pancreatitis, SBO, mesenteric ischemia, serious intra-abdominal bacterial illness. Presentation also not typical of gynecologic emergencies such as TOA, Ovarian Torsion, PID. Not Ectopic. Doubt atypical ACS.  Care of this patient will be signed out to the oncoming physician at the end of my shift.  All  pertinent patient information conveyed and all questions answered.  All further care and disposition decisions will be made by the oncoming physician.      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[place])  Peritoneal metastases ([place])  Other ascites     ED Discharge Orders         Ordered    HYDROcodone-acetaminophen (NORCO/VICODIN) 5-325 MG tablet  Every 6 hours PRN        04/30/21 1508    dexamethasone (DECADRON) 2 MG tablet  Every 12 hours        04/30/21 1508  dronabinol (MARINOL) 5 MG capsule  2 times daily before lunch and supper        04/30/21 1508    fentaNYL (DURAGESIC) 12 MCG/HR  every 72 hours        04/30/21 1508    Increase activity slowly        04/30/21 1508    Diet - low sodium heart healthy        04/30/21 1508           Note:  This document was prepared using Dragon voice recognition software and may include unintentional dictation errors.   Naaman Plummer, MD 05/10/21 [phone]

## 2021-05-23 DEATH — deceased

## 2022-10-21 ENCOUNTER — Encounter (INDEPENDENT_AMBULATORY_CARE_PROVIDER_SITE_OTHER): Payer: Self-pay
